# Patient Record
Sex: Female | Born: 1935 | Race: Black or African American | Hispanic: No | Smoking: Never smoker
Health system: Southern US, Community
[De-identification: ages and names within clinical notes are randomized; demographics above are authoritative.]

## PROBLEM LIST (undated history)

## (undated) DIAGNOSIS — Z9889 Other specified postprocedural states: Secondary | ICD-10-CM

## (undated) DIAGNOSIS — M1990 Unspecified osteoarthritis, unspecified site: Secondary | ICD-10-CM

## (undated) DIAGNOSIS — I639 Cerebral infarction, unspecified: Secondary | ICD-10-CM

## (undated) DIAGNOSIS — E785 Hyperlipidemia, unspecified: Secondary | ICD-10-CM

## (undated) DIAGNOSIS — T4145XA Adverse effect of unspecified anesthetic, initial encounter: Secondary | ICD-10-CM

## (undated) DIAGNOSIS — I509 Heart failure, unspecified: Secondary | ICD-10-CM

## (undated) DIAGNOSIS — Z8669 Personal history of other diseases of the nervous system and sense organs: Secondary | ICD-10-CM

## (undated) DIAGNOSIS — K219 Gastro-esophageal reflux disease without esophagitis: Secondary | ICD-10-CM

## (undated) DIAGNOSIS — T8859XA Other complications of anesthesia, initial encounter: Secondary | ICD-10-CM

## (undated) DIAGNOSIS — Z9289 Personal history of other medical treatment: Secondary | ICD-10-CM

## (undated) DIAGNOSIS — G4730 Sleep apnea, unspecified: Secondary | ICD-10-CM

## (undated) DIAGNOSIS — I2510 Atherosclerotic heart disease of native coronary artery without angina pectoris: Secondary | ICD-10-CM

## (undated) DIAGNOSIS — F32A Depression, unspecified: Secondary | ICD-10-CM

## (undated) DIAGNOSIS — R112 Nausea with vomiting, unspecified: Secondary | ICD-10-CM

## (undated) DIAGNOSIS — I219 Acute myocardial infarction, unspecified: Secondary | ICD-10-CM

## (undated) DIAGNOSIS — I10 Essential (primary) hypertension: Secondary | ICD-10-CM

## (undated) DIAGNOSIS — C801 Malignant (primary) neoplasm, unspecified: Secondary | ICD-10-CM

## (undated) DIAGNOSIS — L409 Psoriasis, unspecified: Secondary | ICD-10-CM

## (undated) DIAGNOSIS — Z789 Other specified health status: Secondary | ICD-10-CM

## (undated) DIAGNOSIS — I739 Peripheral vascular disease, unspecified: Secondary | ICD-10-CM

## (undated) DIAGNOSIS — R0602 Shortness of breath: Secondary | ICD-10-CM

## (undated) DIAGNOSIS — F329 Major depressive disorder, single episode, unspecified: Secondary | ICD-10-CM

## (undated) DIAGNOSIS — Z87440 Personal history of urinary (tract) infections: Secondary | ICD-10-CM

## (undated) DIAGNOSIS — F419 Anxiety disorder, unspecified: Secondary | ICD-10-CM

## (undated) DIAGNOSIS — R222 Localized swelling, mass and lump, trunk: Secondary | ICD-10-CM

## (undated) DIAGNOSIS — R51 Headache: Secondary | ICD-10-CM

## (undated) DIAGNOSIS — G459 Transient cerebral ischemic attack, unspecified: Secondary | ICD-10-CM

## (undated) HISTORY — DX: Gastro-esophageal reflux disease without esophagitis: K21.9

## (undated) HISTORY — DX: Unspecified osteoarthritis, unspecified site: M19.90

## (undated) HISTORY — PX: TONSILLECTOMY AND ADENOIDECTOMY: SUR1326

## (undated) HISTORY — DX: Heart failure, unspecified: I50.9

## (undated) HISTORY — PX: NO PAST SURGERIES: SHX2092

## (undated) HISTORY — DX: Other specified postprocedural states: Z98.890

## (undated) HISTORY — PX: ABDOMINAL HYSTERECTOMY: SHX81

## (undated) HISTORY — DX: Psoriasis, unspecified: L40.9

## (undated) HISTORY — PX: APPENDECTOMY: SHX54

## (undated) HISTORY — DX: Anxiety disorder, unspecified: F41.9

## (undated) HISTORY — DX: Depression, unspecified: F32.A

## (undated) HISTORY — DX: Major depressive disorder, single episode, unspecified: F32.9

## (undated) HISTORY — DX: Malignant (primary) neoplasm, unspecified: C80.1

## (undated) HISTORY — DX: Hyperlipidemia, unspecified: E78.5

## (undated) HISTORY — PX: EYE SURGERY: SHX253

## (undated) HISTORY — DX: Acute myocardial infarction, unspecified: I21.9

## (undated) HISTORY — DX: Personal history of other diseases of the nervous system and sense organs: Z86.69

## (undated) HISTORY — DX: Essential (primary) hypertension: I10

## (undated) HISTORY — PX: UMBILICAL HERNIA REPAIR: SHX196

## (undated) HISTORY — DX: Atherosclerotic heart disease of native coronary artery without angina pectoris: I25.10

---

## 1979-08-15 DIAGNOSIS — I219 Acute myocardial infarction, unspecified: Secondary | ICD-10-CM

## 1979-08-15 HISTORY — DX: Acute myocardial infarction, unspecified: I21.9

## 1993-12-14 HISTORY — PX: BREAST SURGERY: SHX581

## 1995-12-15 HISTORY — PX: FINGER AMPUTATION: SHX636

## 1998-03-14 ENCOUNTER — Encounter: Admission: RE | Admit: 1998-03-14 | Discharge: 1998-06-12 | Payer: Self-pay | Admitting: Dentistry

## 1999-02-20 ENCOUNTER — Encounter: Admission: RE | Admit: 1999-02-20 | Discharge: 1999-05-21 | Payer: Self-pay | Admitting: Anesthesiology

## 1999-03-25 ENCOUNTER — Encounter: Payer: Self-pay | Admitting: Vascular Surgery

## 1999-03-25 ENCOUNTER — Inpatient Hospital Stay: Admission: EM | Admit: 1999-03-25 | Discharge: 1999-04-04 | Payer: Self-pay | Admitting: Vascular Surgery

## 1999-03-27 ENCOUNTER — Encounter: Payer: Self-pay | Admitting: Vascular Surgery

## 2001-05-04 ENCOUNTER — Encounter: Payer: Self-pay | Admitting: Cardiology

## 2001-05-04 ENCOUNTER — Ambulatory Visit (HOSPITAL_COMMUNITY): Admission: RE | Admit: 2001-05-04 | Discharge: 2001-05-04 | Payer: Self-pay | Admitting: Cardiology

## 2001-10-13 ENCOUNTER — Ambulatory Visit (HOSPITAL_COMMUNITY): Admission: RE | Admit: 2001-10-13 | Discharge: 2001-10-13 | Payer: Self-pay | Admitting: Family Medicine

## 2001-10-13 ENCOUNTER — Encounter: Payer: Self-pay | Admitting: Family Medicine

## 2002-04-06 ENCOUNTER — Encounter: Payer: Self-pay | Admitting: Vascular Surgery

## 2002-04-10 ENCOUNTER — Ambulatory Visit (HOSPITAL_COMMUNITY): Admission: RE | Admit: 2002-04-10 | Discharge: 2002-04-10 | Payer: Self-pay | Admitting: Vascular Surgery

## 2002-04-14 ENCOUNTER — Inpatient Hospital Stay (HOSPITAL_COMMUNITY): Admission: RE | Admit: 2002-04-14 | Discharge: 2002-04-15 | Payer: Self-pay | Admitting: Vascular Surgery

## 2002-06-20 ENCOUNTER — Ambulatory Visit (HOSPITAL_COMMUNITY): Admission: RE | Admit: 2002-06-20 | Discharge: 2002-06-20 | Payer: Self-pay | Admitting: Oral Surgery

## 2002-10-16 ENCOUNTER — Encounter: Payer: Self-pay | Admitting: Family Medicine

## 2002-10-16 ENCOUNTER — Ambulatory Visit (HOSPITAL_COMMUNITY): Admission: RE | Admit: 2002-10-16 | Discharge: 2002-10-16 | Payer: Self-pay | Admitting: Family Medicine

## 2002-12-27 ENCOUNTER — Emergency Department (HOSPITAL_COMMUNITY): Admission: EM | Admit: 2002-12-27 | Discharge: 2002-12-27 | Payer: Self-pay | Admitting: *Deleted

## 2003-05-30 ENCOUNTER — Emergency Department (HOSPITAL_COMMUNITY): Admission: EM | Admit: 2003-05-30 | Discharge: 2003-05-31 | Payer: Self-pay | Admitting: *Deleted

## 2003-05-31 ENCOUNTER — Encounter: Payer: Self-pay | Admitting: *Deleted

## 2003-07-02 ENCOUNTER — Emergency Department (HOSPITAL_COMMUNITY): Admission: EM | Admit: 2003-07-02 | Discharge: 2003-07-02 | Payer: Self-pay | Admitting: *Deleted

## 2003-07-16 ENCOUNTER — Encounter: Payer: Self-pay | Admitting: Neurology

## 2003-07-16 ENCOUNTER — Ambulatory Visit (HOSPITAL_COMMUNITY): Admission: RE | Admit: 2003-07-16 | Discharge: 2003-07-16 | Payer: Self-pay | Admitting: Neurology

## 2003-08-24 ENCOUNTER — Encounter: Payer: Self-pay | Admitting: Family Medicine

## 2003-08-24 ENCOUNTER — Ambulatory Visit (HOSPITAL_COMMUNITY): Admission: RE | Admit: 2003-08-24 | Discharge: 2003-08-24 | Payer: Self-pay | Admitting: Family Medicine

## 2003-10-19 ENCOUNTER — Ambulatory Visit (HOSPITAL_COMMUNITY): Admission: RE | Admit: 2003-10-19 | Discharge: 2003-10-19 | Payer: Self-pay | Admitting: Family Medicine

## 2003-12-12 ENCOUNTER — Ambulatory Visit (HOSPITAL_COMMUNITY): Admission: RE | Admit: 2003-12-12 | Discharge: 2003-12-12 | Payer: Self-pay | Admitting: Family Medicine

## 2004-04-27 ENCOUNTER — Inpatient Hospital Stay (HOSPITAL_COMMUNITY): Admission: EM | Admit: 2004-04-27 | Discharge: 2004-04-30 | Payer: Self-pay | Admitting: Emergency Medicine

## 2004-05-14 ENCOUNTER — Ambulatory Visit (HOSPITAL_COMMUNITY): Admission: RE | Admit: 2004-05-14 | Discharge: 2004-05-14 | Payer: Self-pay | Admitting: Family Medicine

## 2004-09-01 ENCOUNTER — Emergency Department (HOSPITAL_COMMUNITY): Admission: EM | Admit: 2004-09-01 | Discharge: 2004-09-01 | Payer: Self-pay | Admitting: Emergency Medicine

## 2004-09-29 ENCOUNTER — Ambulatory Visit (HOSPITAL_COMMUNITY): Admission: RE | Admit: 2004-09-29 | Discharge: 2004-09-29 | Payer: Self-pay | Admitting: Orthopedic Surgery

## 2004-10-24 ENCOUNTER — Encounter: Admission: RE | Admit: 2004-10-24 | Discharge: 2004-10-24 | Payer: Self-pay | Admitting: Orthopedic Surgery

## 2004-10-27 ENCOUNTER — Ambulatory Visit (HOSPITAL_COMMUNITY): Admission: RE | Admit: 2004-10-27 | Discharge: 2004-10-27 | Payer: Self-pay | Admitting: Family Medicine

## 2004-12-14 HISTORY — PX: OTHER SURGICAL HISTORY: SHX169

## 2004-12-15 ENCOUNTER — Ambulatory Visit (HOSPITAL_COMMUNITY): Admission: RE | Admit: 2004-12-15 | Discharge: 2004-12-15 | Payer: Self-pay | Admitting: Family Medicine

## 2005-03-20 ENCOUNTER — Ambulatory Visit (HOSPITAL_COMMUNITY): Admission: RE | Admit: 2005-03-20 | Discharge: 2005-03-20 | Payer: Self-pay | Admitting: Otolaryngology

## 2005-04-30 ENCOUNTER — Ambulatory Visit: Payer: Self-pay | Admitting: Cardiology

## 2005-05-12 ENCOUNTER — Ambulatory Visit: Payer: Self-pay

## 2005-06-12 ENCOUNTER — Ambulatory Visit: Payer: Self-pay | Admitting: Cardiology

## 2005-06-12 ENCOUNTER — Inpatient Hospital Stay (HOSPITAL_COMMUNITY): Admission: AD | Admit: 2005-06-12 | Discharge: 2005-06-17 | Payer: Self-pay | Admitting: Family Medicine

## 2005-07-27 ENCOUNTER — Ambulatory Visit (HOSPITAL_COMMUNITY): Admission: RE | Admit: 2005-07-27 | Discharge: 2005-07-27 | Payer: Self-pay | Admitting: Internal Medicine

## 2005-08-20 ENCOUNTER — Ambulatory Visit (HOSPITAL_COMMUNITY): Admission: RE | Admit: 2005-08-20 | Discharge: 2005-08-20 | Payer: Self-pay | Admitting: Urology

## 2005-09-03 ENCOUNTER — Emergency Department (HOSPITAL_COMMUNITY): Admission: EM | Admit: 2005-09-03 | Discharge: 2005-09-04 | Payer: Self-pay | Admitting: Emergency Medicine

## 2005-10-29 ENCOUNTER — Ambulatory Visit (HOSPITAL_COMMUNITY): Admission: RE | Admit: 2005-10-29 | Discharge: 2005-10-29 | Payer: Self-pay | Admitting: Family Medicine

## 2005-11-04 ENCOUNTER — Other Ambulatory Visit: Admission: RE | Admit: 2005-11-04 | Discharge: 2005-11-04 | Payer: Self-pay | Admitting: Dermatology

## 2006-01-09 ENCOUNTER — Emergency Department (HOSPITAL_COMMUNITY): Admission: EM | Admit: 2006-01-09 | Discharge: 2006-01-09 | Payer: Self-pay | Admitting: Emergency Medicine

## 2006-02-26 ENCOUNTER — Ambulatory Visit (HOSPITAL_COMMUNITY): Admission: RE | Admit: 2006-02-26 | Discharge: 2006-02-26 | Payer: Self-pay | Admitting: Family Medicine

## 2006-03-17 ENCOUNTER — Ambulatory Visit: Payer: Self-pay | Admitting: Cardiology

## 2006-03-17 ENCOUNTER — Ambulatory Visit: Payer: Self-pay

## 2006-03-29 ENCOUNTER — Ambulatory Visit: Payer: Self-pay | Admitting: Cardiology

## 2006-03-29 ENCOUNTER — Ambulatory Visit: Payer: Self-pay

## 2006-04-09 ENCOUNTER — Ambulatory Visit: Payer: Self-pay | Admitting: Cardiology

## 2006-04-19 ENCOUNTER — Ambulatory Visit (HOSPITAL_COMMUNITY): Admission: RE | Admit: 2006-04-19 | Discharge: 2006-04-19 | Payer: Self-pay | Admitting: Family Medicine

## 2006-04-23 ENCOUNTER — Ambulatory Visit: Payer: Self-pay | Admitting: Cardiology

## 2006-05-05 ENCOUNTER — Ambulatory Visit: Payer: Self-pay | Admitting: Cardiology

## 2006-05-05 ENCOUNTER — Ambulatory Visit: Payer: Self-pay

## 2006-11-01 ENCOUNTER — Ambulatory Visit (HOSPITAL_COMMUNITY): Admission: RE | Admit: 2006-11-01 | Discharge: 2006-11-01 | Payer: Self-pay | Admitting: Family Medicine

## 2006-11-08 ENCOUNTER — Ambulatory Visit: Payer: Self-pay | Admitting: Cardiology

## 2006-12-26 ENCOUNTER — Observation Stay (HOSPITAL_COMMUNITY): Admission: EM | Admit: 2006-12-26 | Discharge: 2006-12-28 | Payer: Self-pay | Admitting: Emergency Medicine

## 2007-01-05 ENCOUNTER — Ambulatory Visit: Payer: Self-pay | Admitting: Gastroenterology

## 2007-01-05 ENCOUNTER — Encounter (INDEPENDENT_AMBULATORY_CARE_PROVIDER_SITE_OTHER): Payer: Self-pay | Admitting: *Deleted

## 2007-01-05 ENCOUNTER — Ambulatory Visit (HOSPITAL_COMMUNITY): Admission: RE | Admit: 2007-01-05 | Discharge: 2007-01-05 | Payer: Self-pay | Admitting: Gastroenterology

## 2007-01-20 ENCOUNTER — Ambulatory Visit: Payer: Self-pay | Admitting: Gastroenterology

## 2007-02-18 ENCOUNTER — Ambulatory Visit (HOSPITAL_COMMUNITY): Admission: RE | Admit: 2007-02-18 | Discharge: 2007-02-18 | Payer: Self-pay | Admitting: Gastroenterology

## 2007-03-08 ENCOUNTER — Ambulatory Visit: Payer: Self-pay | Admitting: Gastroenterology

## 2007-04-06 ENCOUNTER — Encounter (INDEPENDENT_AMBULATORY_CARE_PROVIDER_SITE_OTHER): Payer: Self-pay | Admitting: *Deleted

## 2007-04-06 ENCOUNTER — Ambulatory Visit (HOSPITAL_COMMUNITY): Admission: RE | Admit: 2007-04-06 | Discharge: 2007-04-06 | Payer: Self-pay | Admitting: Gastroenterology

## 2007-04-06 ENCOUNTER — Ambulatory Visit: Payer: Self-pay | Admitting: Gastroenterology

## 2007-04-12 ENCOUNTER — Observation Stay (HOSPITAL_COMMUNITY): Admission: EM | Admit: 2007-04-12 | Discharge: 2007-04-15 | Payer: Self-pay | Admitting: Emergency Medicine

## 2007-04-20 ENCOUNTER — Ambulatory Visit: Payer: Self-pay | Admitting: Cardiology

## 2007-05-02 ENCOUNTER — Ambulatory Visit: Payer: Self-pay | Admitting: Gastroenterology

## 2007-09-17 ENCOUNTER — Emergency Department (HOSPITAL_COMMUNITY): Admission: EM | Admit: 2007-09-17 | Discharge: 2007-09-18 | Payer: Self-pay | Admitting: Emergency Medicine

## 2007-10-24 ENCOUNTER — Ambulatory Visit: Payer: Self-pay | Admitting: Cardiology

## 2007-11-03 ENCOUNTER — Ambulatory Visit (HOSPITAL_COMMUNITY): Admission: RE | Admit: 2007-11-03 | Discharge: 2007-11-03 | Payer: Self-pay | Admitting: Family Medicine

## 2007-11-22 ENCOUNTER — Ambulatory Visit: Payer: Self-pay | Admitting: Vascular Surgery

## 2007-11-24 ENCOUNTER — Ambulatory Visit: Payer: Self-pay | Admitting: Gastroenterology

## 2007-12-10 ENCOUNTER — Observation Stay (HOSPITAL_COMMUNITY): Admission: EM | Admit: 2007-12-10 | Discharge: 2007-12-12 | Payer: Self-pay | Admitting: Emergency Medicine

## 2007-12-12 ENCOUNTER — Encounter (INDEPENDENT_AMBULATORY_CARE_PROVIDER_SITE_OTHER): Payer: Self-pay | Admitting: Family Medicine

## 2008-02-20 ENCOUNTER — Ambulatory Visit (HOSPITAL_COMMUNITY): Admission: RE | Admit: 2008-02-20 | Discharge: 2008-02-20 | Payer: Self-pay | Admitting: Gastroenterology

## 2008-03-22 ENCOUNTER — Emergency Department (HOSPITAL_COMMUNITY): Admission: EM | Admit: 2008-03-22 | Discharge: 2008-03-22 | Payer: Self-pay | Admitting: Emergency Medicine

## 2008-04-23 ENCOUNTER — Ambulatory Visit (HOSPITAL_COMMUNITY): Admission: RE | Admit: 2008-04-23 | Discharge: 2008-04-23 | Payer: Self-pay | Admitting: Family Medicine

## 2008-05-08 ENCOUNTER — Ambulatory Visit: Payer: Self-pay | Admitting: Cardiology

## 2008-05-22 ENCOUNTER — Ambulatory Visit: Payer: Self-pay | Admitting: Vascular Surgery

## 2008-06-05 ENCOUNTER — Ambulatory Visit: Payer: Self-pay | Admitting: Gastroenterology

## 2008-10-22 ENCOUNTER — Ambulatory Visit: Payer: Self-pay | Admitting: Cardiology

## 2008-10-22 LAB — CONVERTED CEMR LAB
BUN: 13 mg/dL (ref 6–23)
CO2: 29 meq/L (ref 19–32)
Calcium: 9.1 mg/dL (ref 8.4–10.5)
Creatinine, Ser: 1.1 mg/dL (ref 0.4–1.2)

## 2008-10-26 DIAGNOSIS — I10 Essential (primary) hypertension: Secondary | ICD-10-CM | POA: Insufficient documentation

## 2008-10-26 DIAGNOSIS — K8020 Calculus of gallbladder without cholecystitis without obstruction: Secondary | ICD-10-CM | POA: Insufficient documentation

## 2008-10-26 DIAGNOSIS — Z853 Personal history of malignant neoplasm of breast: Secondary | ICD-10-CM

## 2008-10-26 DIAGNOSIS — H409 Unspecified glaucoma: Secondary | ICD-10-CM | POA: Insufficient documentation

## 2008-10-26 DIAGNOSIS — S88911A Complete traumatic amputation of right lower leg, level unspecified, initial encounter: Secondary | ICD-10-CM

## 2008-10-26 DIAGNOSIS — E118 Type 2 diabetes mellitus with unspecified complications: Secondary | ICD-10-CM

## 2008-10-26 DIAGNOSIS — S88912A Complete traumatic amputation of left lower leg, level unspecified, initial encounter: Secondary | ICD-10-CM

## 2008-10-26 DIAGNOSIS — E1165 Type 2 diabetes mellitus with hyperglycemia: Secondary | ICD-10-CM

## 2008-10-26 DIAGNOSIS — Z87448 Personal history of other diseases of urinary system: Secondary | ICD-10-CM | POA: Insufficient documentation

## 2008-10-26 DIAGNOSIS — A048 Other specified bacterial intestinal infections: Secondary | ICD-10-CM | POA: Insufficient documentation

## 2008-11-06 ENCOUNTER — Ambulatory Visit (HOSPITAL_COMMUNITY): Admission: RE | Admit: 2008-11-06 | Discharge: 2008-11-06 | Payer: Self-pay | Admitting: Family Medicine

## 2008-11-22 ENCOUNTER — Ambulatory Visit (HOSPITAL_COMMUNITY): Admission: RE | Admit: 2008-11-22 | Discharge: 2008-11-22 | Payer: Self-pay | Admitting: Family Medicine

## 2008-11-27 ENCOUNTER — Ambulatory Visit: Payer: Self-pay | Admitting: Vascular Surgery

## 2008-12-14 DIAGNOSIS — R222 Localized swelling, mass and lump, trunk: Secondary | ICD-10-CM

## 2008-12-14 HISTORY — DX: Localized swelling, mass and lump, trunk: R22.2

## 2008-12-16 ENCOUNTER — Ambulatory Visit: Payer: Self-pay | Admitting: Physical Medicine & Rehabilitation

## 2009-02-05 ENCOUNTER — Ambulatory Visit (HOSPITAL_COMMUNITY): Admission: RE | Admit: 2009-02-05 | Discharge: 2009-02-05 | Payer: Self-pay | Admitting: Urology

## 2009-02-12 ENCOUNTER — Ambulatory Visit (HOSPITAL_COMMUNITY): Admission: RE | Admit: 2009-02-12 | Discharge: 2009-02-12 | Payer: Self-pay | Admitting: Gastroenterology

## 2009-02-25 ENCOUNTER — Ambulatory Visit (HOSPITAL_COMMUNITY): Admission: RE | Admit: 2009-02-25 | Discharge: 2009-02-25 | Payer: Self-pay | Admitting: Urology

## 2009-03-12 ENCOUNTER — Ambulatory Visit: Payer: Self-pay | Admitting: Vascular Surgery

## 2009-06-01 DIAGNOSIS — R609 Edema, unspecified: Secondary | ICD-10-CM

## 2009-06-11 ENCOUNTER — Ambulatory Visit: Payer: Self-pay | Admitting: Cardiology

## 2009-07-09 ENCOUNTER — Ambulatory Visit: Payer: Self-pay | Admitting: Vascular Surgery

## 2009-08-24 ENCOUNTER — Emergency Department (HOSPITAL_COMMUNITY): Admission: EM | Admit: 2009-08-24 | Discharge: 2009-08-24 | Payer: Self-pay | Admitting: Emergency Medicine

## 2009-09-19 ENCOUNTER — Ambulatory Visit: Payer: Self-pay | Admitting: Gastroenterology

## 2009-09-19 DIAGNOSIS — K589 Irritable bowel syndrome without diarrhea: Secondary | ICD-10-CM | POA: Insufficient documentation

## 2009-09-22 ENCOUNTER — Emergency Department (HOSPITAL_COMMUNITY): Admission: EM | Admit: 2009-09-22 | Discharge: 2009-09-22 | Payer: Self-pay | Admitting: Emergency Medicine

## 2009-09-23 DIAGNOSIS — R1084 Generalized abdominal pain: Secondary | ICD-10-CM | POA: Insufficient documentation

## 2009-09-23 DIAGNOSIS — K5900 Constipation, unspecified: Secondary | ICD-10-CM | POA: Insufficient documentation

## 2009-09-30 ENCOUNTER — Telehealth: Payer: Self-pay | Admitting: Cardiology

## 2009-10-03 ENCOUNTER — Ambulatory Visit: Payer: Self-pay | Admitting: Vascular Surgery

## 2009-10-18 ENCOUNTER — Encounter (INDEPENDENT_AMBULATORY_CARE_PROVIDER_SITE_OTHER): Payer: Self-pay | Admitting: *Deleted

## 2009-10-24 ENCOUNTER — Encounter: Admission: RE | Admit: 2009-10-24 | Discharge: 2009-10-24 | Payer: Self-pay | Admitting: Vascular Surgery

## 2009-10-24 ENCOUNTER — Ambulatory Visit: Payer: Self-pay | Admitting: Vascular Surgery

## 2009-11-08 ENCOUNTER — Ambulatory Visit (HOSPITAL_COMMUNITY): Admission: RE | Admit: 2009-11-08 | Discharge: 2009-11-08 | Payer: Self-pay | Admitting: Family Medicine

## 2009-11-11 ENCOUNTER — Ambulatory Visit (HOSPITAL_COMMUNITY): Admission: RE | Admit: 2009-11-11 | Discharge: 2009-11-11 | Payer: Self-pay | Admitting: Vascular Surgery

## 2009-11-11 ENCOUNTER — Ambulatory Visit: Payer: Self-pay | Admitting: Vascular Surgery

## 2009-11-27 ENCOUNTER — Ambulatory Visit: Payer: Self-pay | Admitting: Vascular Surgery

## 2009-12-05 ENCOUNTER — Ambulatory Visit: Payer: Self-pay | Admitting: Vascular Surgery

## 2009-12-11 ENCOUNTER — Ambulatory Visit: Payer: Self-pay | Admitting: Cardiovascular Disease

## 2009-12-11 ENCOUNTER — Ambulatory Visit: Payer: Self-pay | Admitting: Vascular Surgery

## 2009-12-11 ENCOUNTER — Inpatient Hospital Stay (HOSPITAL_COMMUNITY): Admission: AD | Admit: 2009-12-11 | Discharge: 2009-12-25 | Payer: Self-pay | Admitting: Internal Medicine

## 2009-12-11 ENCOUNTER — Encounter (INDEPENDENT_AMBULATORY_CARE_PROVIDER_SITE_OTHER): Payer: Self-pay | Admitting: Internal Medicine

## 2009-12-11 ENCOUNTER — Encounter: Payer: Self-pay | Admitting: Vascular Surgery

## 2009-12-12 HISTORY — PX: ABOVE KNEE LEG AMPUTATION: SUR20

## 2009-12-13 ENCOUNTER — Ambulatory Visit: Payer: Self-pay | Admitting: Internal Medicine

## 2009-12-14 ENCOUNTER — Ambulatory Visit: Payer: Self-pay | Admitting: Emergency Medicine

## 2009-12-23 ENCOUNTER — Encounter (INDEPENDENT_AMBULATORY_CARE_PROVIDER_SITE_OTHER): Payer: Self-pay | Admitting: Internal Medicine

## 2010-01-16 ENCOUNTER — Ambulatory Visit: Payer: Self-pay | Admitting: Vascular Surgery

## 2010-02-03 ENCOUNTER — Encounter: Admission: RE | Admit: 2010-02-03 | Discharge: 2010-02-04 | Payer: Self-pay | Admitting: Vascular Surgery

## 2010-02-14 ENCOUNTER — Ambulatory Visit: Payer: Self-pay | Admitting: Vascular Surgery

## 2010-02-24 ENCOUNTER — Ambulatory Visit: Admission: RE | Admit: 2010-02-24 | Discharge: 2010-02-24 | Payer: Self-pay | Admitting: Vascular Surgery

## 2010-03-25 ENCOUNTER — Ambulatory Visit: Payer: Self-pay | Admitting: Vascular Surgery

## 2010-04-01 ENCOUNTER — Emergency Department (HOSPITAL_COMMUNITY): Admission: EM | Admit: 2010-04-01 | Discharge: 2010-04-01 | Payer: Self-pay | Admitting: Emergency Medicine

## 2010-04-24 ENCOUNTER — Inpatient Hospital Stay (HOSPITAL_COMMUNITY): Admission: EM | Admit: 2010-04-24 | Discharge: 2010-04-29 | Payer: Self-pay | Admitting: Emergency Medicine

## 2010-05-14 ENCOUNTER — Ambulatory Visit (HOSPITAL_COMMUNITY): Admission: RE | Admit: 2010-05-14 | Discharge: 2010-05-14 | Payer: Self-pay | Admitting: Family Medicine

## 2010-05-26 ENCOUNTER — Ambulatory Visit (HOSPITAL_COMMUNITY): Admission: RE | Admit: 2010-05-26 | Discharge: 2010-05-26 | Payer: Self-pay | Admitting: Family Medicine

## 2010-06-10 ENCOUNTER — Other Ambulatory Visit: Admission: RE | Admit: 2010-06-10 | Discharge: 2010-06-10 | Payer: Self-pay | Admitting: Interventional Radiology

## 2010-06-10 ENCOUNTER — Encounter: Admission: RE | Admit: 2010-06-10 | Discharge: 2010-06-10 | Payer: Self-pay | Admitting: Family Medicine

## 2010-07-03 ENCOUNTER — Ambulatory Visit: Payer: Self-pay | Admitting: Vascular Surgery

## 2010-12-24 ENCOUNTER — Ambulatory Visit
Admission: RE | Admit: 2010-12-24 | Discharge: 2010-12-24 | Payer: Self-pay | Source: Home / Self Care | Attending: Vascular Surgery | Admitting: Vascular Surgery

## 2010-12-24 ENCOUNTER — Ambulatory Visit: Admit: 2010-12-24 | Payer: Self-pay | Admitting: Vascular Surgery

## 2011-01-03 NOTE — Procedures (Unsigned)
BYPASS GRAFT EVALUATION  INDICATION:  Follow up right lower extremity bypass graft.  HISTORY: Diabetes:  Yes. Cardiac:  MI. Hypertension:  Yes. Smoking:  No. Previous Surgery:  Right femoral-peroneal artery bypass graft, 10/07/1995; left AKA, 12/12/2009.  SINGLE LEVEL ARTERIAL EXAM                              RIGHT              LEFT Brachial:                    132 Anterior tibial:             121 Posterior tibial:            135 Peroneal:                    133 Ankle/brachial index:        1.02  PREVIOUS ABI:  Date: 07/03/2010  RIGHT:  1.13  LEFT:  AKA  LOWER EXTREMITY BYPASS GRAFT DUPLEX EXAM:  DUPLEX: 1. Patent right femoral-peroneal artery bypass graft with stable     elevated velocities of 493 cm/s at the distal anastomosis. 2. Unable to visualize proximal thigh area due to positioning while in     wheelchair.  IMPRESSION: 1. Patent right femoral-peroneal artery bypass graft with stable     distal anastomosis stenosis. 2. Stable right ankle brachial index. 3. No significant changes from previous study.  ___________________________________________ Di Kindle. Edilia Bo, M.D.  AS/MEDQ  D:  12/24/2010  T:  12/24/2010  Job:  [id number]

## 2011-01-20 ENCOUNTER — Encounter: Payer: Self-pay | Admitting: Cardiology

## 2011-01-20 ENCOUNTER — Ambulatory Visit (INDEPENDENT_AMBULATORY_CARE_PROVIDER_SITE_OTHER): Payer: Medicare Other | Admitting: Cardiology

## 2011-01-20 DIAGNOSIS — I2510 Atherosclerotic heart disease of native coronary artery without angina pectoris: Secondary | ICD-10-CM

## 2011-01-20 DIAGNOSIS — I6529 Occlusion and stenosis of unspecified carotid artery: Secondary | ICD-10-CM

## 2011-01-29 NOTE — Assessment & Plan Note (Signed)
Summary: f1y   Visit Type:  46yr follow up Primary Provider:  Renard Matter, M.D.  CC:  mild chest discomfort at times. edema in foot. pt has no other complaints today.  History of Present Illness: Mrs Bal returns today for evaluation and management of her coronary artery disease. She has diffuse vascular disease and nearly did not survive A. above the knee amputation of her left lower extremity in January of 2011.  At that time she had acute renal failure, respiratory arrest, systemic infection, and probably a stroke.  She's had side effects from statins including Lipitor, Crestor and simvastatin. She does not like to take medication which has been a lifelong issue.  She currently having no angina ischemic symptoms. She is confined mostly to a wheelchair. Her edema has been under control.  She had followup with vascular surgery January 2012. She had good blood flow in the right lower extremity by Doppler. Followup Dopplers in one year and followup with Dr. Edilia Bo in 2 years.  She denies orthopnea PND. She's had no tachycardia palpitations.  EKG is stable today.  Current Medications (verified): 1)  Lasix 20 Mg Tabs (Furosemide) .... Take 1 Tablet By Mouth Once A Day 2)  Pilocarpine Hcl 2 % Soln (Pilocarpine Hcl) .... One Drop To Right Eye Four Times Daily 3)  Humalog 100 Unit/ml Soln (Insulin Lispro (Human)) .... Use As Directed 4)  Lantus 100 Unit/ml Soln (Insulin Glargine) .... Use As Directed 5)  Cosopt 2-0.5 % Soln (Dorzolamide-Timolol) .... Twice Daily 6)  Travatan 0.004 % Soln (Travoprost) .Marland Kitchen.. 1 Gtt Ou At Bedtime 7)  Aspirin 81 Mg Tbec (Aspirin) .... Take One Tablet Once A Day 8)  Systane 0.4-0.3 % Soln (Polyethyl Glycol-Propyl Glycol) .... Once Daily 9)  Nitrofurantoin Monohyd Macro 100 Mg Caps (Nitrofurantoin Monohyd Macro) .Marland Kitchen.. 1 Cap Two Times A Day 10)  Pantoprazole Sodium 40 Mg Tbec (Pantoprazole Sodium) .... Once Daily 11)  Amlodipine Besylate 10 Mg Tabs (Amlodipine Besylate)  .... Once Daily 12)  Allopurinol 100 Mg Tabs (Allopurinol) .... Once Daily For Gout  Allergies: 1)  ! Asa 2)  ! Sulfa 3)  ! Penicillin 4)  ! Macrodantin 5)  ! Codeine 6)  ! Crestor (Rosuvastatin Calcium) 7)  ! Lipitor (Atorvastatin Calcium)  Past History:  Past Medical History: Last updated: 09/19/2009 CAROTID ARTERY DISEASE/NONOBSTRUCTIVE (ICD-433.10) CEREBRAL ARTERY OCCLUSION/MIDDLE (ICD-434.90) CAD, UNSPECIFIED SITE/NONOBSTRUCTIVE (ICD-414.00) PVD (ICD-443.9) HYPERTENSION (ICD-401.9) NEOPLASM, MALIGNANT, BREAST, HX OF (ICD-V10.3) EDEMA (ICD-782.3) GALLSTONES (ICD-574.20) UTI'S, HX OF (ICD-V13.00) GLAUCOMA (ICD-365.9) HELICOBACTER PYLORI GASTRITIS (ICD-041.86) DM (ICD-250.00) PSORIASIS   Past Surgical History: Last updated: 28-Jun-2009 Right Fem-Pop By Pass Hysterectomy with umbilical hernia repair Left Mastectomy ( followed by chemo) Retinal detachment Left third digit osteomyelitis status post amputation Tonsilectomy/ Adenoidectomy Tubular Adenoma removed during tcs in Jan. 2008 Appendectomy  Family History: Last updated: 28-Jun-2009 Mother: deceased at 24 from emphysema Father: deceased at 2 from emphysema  and seizures Family History of Cancer:  Family History of Diabetes:  Family History of COPD: Siblings: Brother deceased at 71 ...massive MI as well asthma and bronchitis  Social History: Last updated: 2009/06/28 Tobacco Use - No.  Alcohol Use - no Regular Exercise - no Drug Use - no Retired  Married   Risk Factors: Exercise: no (Jun 28, 2009)  Risk Factors: Smoking Status: never (06-28-09)  Review of Systems       negative other than history of present illness  Vital Signs:  Patient profile:   77 year old female Height:  65 inches Weight:      215 pounds BMI:     35.91 Pulse rate:   81 / minute BP sitting:   142 / 70  (right arm)  Vitals Entered By: Celestia Khat, CMA (January 20, 2011 9:04 AM)  Physical Exam  General:   morbidly obese, confined to wheelchair Head:  normocephalic and atraumatic Eyes:  dysconjugate gaze, otherwise normal Neck:  Neck supple, no JVD. No masses, thyromegaly or abnormal cervical nodes. Lungs:  Clear bilaterally to auscultation and percussion. Heart:  PMI poorly appreciated, normal S1-S2, soft carotid bruits Msk:  decreased ROM.   Pulses:  barely palpable in the right lower extremity Extremities:  no significant edema, left above-the-knee amputation Neurologic:  right hemiplegia Skin:  Intact without lesions or rashes. Psych:  depressed affect.     Impression & Recommendations:  Problem # 1:  CAROTID ARTERY DISEASE/NONOBSTRUCTIVE (ICD-433.10) Assessment Unchanged continue medical therapy, discussed the fact that we she could take a statin. I spent over 20 minutes talking to her and her daughter about her condition and what happened to her in the hospital after her surgery. She had numerous questions. I attempted to answer them all. I would not clear her for any elective surgery in the future. I made it clear to the family and her period Her updated medication list for this problem includes:    Aspirin 81 Mg Tbec (Aspirin) .Marland Kitchen... Take one tablet once a day  Problem # 2:  CEREBRAL ARTERY OCCLUSION/MIDDLE (ICD-434.90) Assessment: Unchanged  Her updated medication list for this problem includes:    Aspirin 81 Mg Tbec (Aspirin) .Marland Kitchen... Take one tablet once a day  Problem # 3:  CAD, UNSPECIFIED SITE/NONOBSTRUCTIVE (ICD-414.00) Assessment: Unchanged  The following medications were removed from the medication list:    Enalapril Maleate 20 Mg Tabs (Enalapril maleate) .Marland Kitchen... Take one tablet by mouth twice a day Her updated medication list for this problem includes:    Aspirin 81 Mg Tbec (Aspirin) .Marland Kitchen... Take one tablet once a day    Amlodipine Besylate 10 Mg Tabs (Amlodipine besylate) ..... Once daily  Orders: EKG w/ Interpretation (93000)  Problem # 4:  PVD  (ICD-443.9) Assessment: Unchanged  Problem # 5:  EDEMA (ICD-782.3) Assessment: Improved  Problem # 6:  HYPERTENSION (ICD-401.9) Assessment: Improved  The following medications were removed from the medication list:    Enalapril Maleate 20 Mg Tabs (Enalapril maleate) .Marland Kitchen... Take one tablet by mouth twice a day Her updated medication list for this problem includes:    Lasix 20 Mg Tabs (Furosemide) .Marland Kitchen... Take 1 tablet by mouth once a day    Aspirin 81 Mg Tbec (Aspirin) .Marland Kitchen... Take one tablet once a day    Amlodipine Besylate 10 Mg Tabs (Amlodipine besylate) ..... Once daily  Patient Instructions: 1)  Your physician recommends that you schedule a follow-up appointment in: 1 year with Dr. Daleen Squibb 2)  Your physician recommends that you continue on your current medications as directed. Please refer to the Current Medication list given to you today.

## 2011-02-10 ENCOUNTER — Other Ambulatory Visit (HOSPITAL_COMMUNITY): Payer: Self-pay | Admitting: Family Medicine

## 2011-02-10 DIAGNOSIS — E232 Diabetes insipidus: Secondary | ICD-10-CM

## 2011-02-10 DIAGNOSIS — I739 Peripheral vascular disease, unspecified: Secondary | ICD-10-CM

## 2011-02-16 ENCOUNTER — Ambulatory Visit (HOSPITAL_COMMUNITY)
Admission: RE | Admit: 2011-02-16 | Discharge: 2011-02-16 | Disposition: A | Discharge status: Home / Self Care | Payer: Medicare Other | Source: Ambulatory Visit | Attending: Family Medicine | Admitting: Family Medicine

## 2011-02-16 DIAGNOSIS — E119 Type 2 diabetes mellitus without complications: Secondary | ICD-10-CM | POA: Insufficient documentation

## 2011-02-16 DIAGNOSIS — I739 Peripheral vascular disease, unspecified: Secondary | ICD-10-CM | POA: Insufficient documentation

## 2011-02-16 DIAGNOSIS — E232 Diabetes insipidus: Secondary | ICD-10-CM

## 2011-02-16 DIAGNOSIS — K5520 Angiodysplasia of colon without hemorrhage: Secondary | ICD-10-CM | POA: Insufficient documentation

## 2011-02-16 DIAGNOSIS — E8889 Other specified metabolic disorders: Secondary | ICD-10-CM | POA: Insufficient documentation

## 2011-02-22 ENCOUNTER — Emergency Department (HOSPITAL_COMMUNITY): Admission: EM | Admit: 2011-02-22 | Source: Home / Self Care

## 2011-02-22 ENCOUNTER — Emergency Department (HOSPITAL_COMMUNITY): Payer: Medicare Other

## 2011-02-22 ENCOUNTER — Inpatient Hospital Stay (HOSPITAL_COMMUNITY)
Admission: EM | Admit: 2011-02-22 | Discharge: 2011-02-26 | DRG: 195 | Disposition: A | Discharge status: Home / Self Care | Payer: Medicare Other | Attending: Family Medicine | Admitting: Family Medicine

## 2011-02-22 DIAGNOSIS — H409 Unspecified glaucoma: Secondary | ICD-10-CM | POA: Diagnosis present

## 2011-02-22 DIAGNOSIS — S78119A Complete traumatic amputation at level between unspecified hip and knee, initial encounter: Secondary | ICD-10-CM

## 2011-02-22 DIAGNOSIS — I252 Old myocardial infarction: Secondary | ICD-10-CM

## 2011-02-22 DIAGNOSIS — Z8673 Personal history of transient ischemic attack (TIA), and cerebral infarction without residual deficits: Secondary | ICD-10-CM

## 2011-02-22 DIAGNOSIS — E119 Type 2 diabetes mellitus without complications: Secondary | ICD-10-CM | POA: Diagnosis present

## 2011-02-22 DIAGNOSIS — I739 Peripheral vascular disease, unspecified: Secondary | ICD-10-CM | POA: Diagnosis present

## 2011-02-22 DIAGNOSIS — J180 Bronchopneumonia, unspecified organism: Principal | ICD-10-CM | POA: Diagnosis present

## 2011-02-22 DIAGNOSIS — E785 Hyperlipidemia, unspecified: Secondary | ICD-10-CM | POA: Diagnosis present

## 2011-02-22 DIAGNOSIS — I2510 Atherosclerotic heart disease of native coronary artery without angina pectoris: Secondary | ICD-10-CM | POA: Diagnosis present

## 2011-02-22 LAB — DIFFERENTIAL
Basophils Relative: 0 % (ref 0–1)
Lymphs Abs: 2.1 10*3/uL (ref 0.7–4.0)
Monocytes Relative: 9 % (ref 3–12)
Neutro Abs: 3.4 10*3/uL (ref 1.7–7.7)
Neutrophils Relative %: 53 % (ref 43–77)

## 2011-02-22 LAB — CBC
Hemoglobin: 12.7 g/dL (ref 12.0–15.0)
MCH: 30 pg (ref 26.0–34.0)
MCV: 86.8 fL (ref 78.0–100.0)
RBC: 4.24 MIL/uL (ref 3.87–5.11)

## 2011-02-22 LAB — BASIC METABOLIC PANEL
CO2: 30 mEq/L (ref 19–32)
Chloride: 98 mEq/L (ref 96–112)
Creatinine, Ser: 1.45 mg/dL — ABNORMAL HIGH (ref 0.4–1.2)
GFR calc Af Amer: 42 mL/min — ABNORMAL LOW (ref >60)

## 2011-02-22 LAB — GLUCOSE, CAPILLARY
Glucose-Capillary: 183 mg/dL — ABNORMAL HIGH (ref 70–99)
Glucose-Capillary: 162 mg/dL — ABNORMAL HIGH (ref 70–99)
Glucose-Capillary: 91 mg/dL (ref 70–99)

## 2011-02-23 LAB — GLUCOSE, CAPILLARY
Glucose-Capillary: 89 mg/dL (ref 70–99)
Glucose-Capillary: 172 mg/dL — ABNORMAL HIGH (ref 70–99)
Glucose-Capillary: 124 mg/dL — ABNORMAL HIGH (ref 70–99)

## 2011-02-24 LAB — GLUCOSE, CAPILLARY: Glucose-Capillary: 212 mg/dL — ABNORMAL HIGH (ref 70–99)

## 2011-02-25 ENCOUNTER — Inpatient Hospital Stay (HOSPITAL_COMMUNITY): Payer: Medicare Other

## 2011-02-25 LAB — BASIC METABOLIC PANEL
BUN: 14 mg/dL (ref 6–23)
Calcium: 8.9 mg/dL (ref 8.4–10.5)
GFR calc non Af Amer: 39 mL/min — ABNORMAL LOW (ref >60)
Glucose, Bld: 97 mg/dL (ref 70–99)

## 2011-02-25 LAB — DIFFERENTIAL
Eosinophils Relative: 7 % — ABNORMAL HIGH (ref 0–5)
Lymphocytes Relative: 43 % (ref 12–46)
Lymphs Abs: 2.4 10*3/uL (ref 0.7–4.0)
Monocytes Absolute: 0.5 10*3/uL (ref 0.1–1.0)
Monocytes Relative: 8 % (ref 3–12)

## 2011-02-25 LAB — CBC
HCT: 35.9 % — ABNORMAL LOW (ref 36.0–46.0)
MCHC: 35.1 g/dL (ref 30.0–36.0)
MCV: 84.3 fL (ref 78.0–100.0)
RDW: 12.7 % (ref 11.5–15.5)

## 2011-02-25 LAB — GLUCOSE, CAPILLARY: Glucose-Capillary: 134 mg/dL — ABNORMAL HIGH (ref 70–99)

## 2011-02-26 LAB — GLUCOSE, CAPILLARY
Glucose-Capillary: 136 mg/dL — ABNORMAL HIGH (ref 70–99)
Glucose-Capillary: 140 mg/dL — ABNORMAL HIGH (ref 70–99)
Glucose-Capillary: 74 mg/dL (ref 70–99)

## 2011-02-26 NOTE — H&P (Signed)
  NAME:  [name], [name]                   ACCOUNT NO.:  [id number]  MEDICAL RECORD NO.:  [id number]           PATIENT TYPE:  LOCATION:                                 FACILITY:  PHYSICIAN:  Adina Puzzo G. Renard Matter, MD   DATE OF BIRTH:  Sep 08, 1926  DATE OF ADMISSION: DATE OF DISCHARGE:  LH                             HISTORY & PHYSICAL   HISTORY OF PRESENT ILLNESS:  [name], was admitted with chief complaint of being cough, some shortness of breath over the past few days.  She also has been having chills and fever, had been started on Levaquin as an outpatient.  She was seen in the emergency room by ED physician.  The patient did have a chest x-ray, which showed evidence of pneumonia.  It was felt that she should be admitted for further care.  SOCIAL HISTORY:  The patient does not smoke or drink alcohol or use drugs.  Lives at home with her relatives.  SURGICAL HISTORY:  The patient has had a previous history of above-knee amputation of left leg, CABG, coronary artery disease, previous hysterectomy, mastectomy, and eye surgery.  PAST MEDICAL HISTORY:  The patient has history of hypertension, diabetes, breast cancer, degenerative disk disease, glaucoma, hyperlipidemia, previous myocardial infarction, neuropathy, peripheral vascular disease, retinal detachment, transient ischemic attack.  ALLERGIES:  PENICILLIN, SULFA, SOME PAIN MEDICATIONS.  MEDICATION LIST: 1. Amlodipine 10 mg daily. 2. Levofloxacin 500 mg daily. 3. Benzonatate 100 mg. 4. Furosemide. 5. Humalog insulin. 6. Lantus insulin.  REVIEW OF SYSTEMS:  HEENT:  Negative.  CARDIOPULMONARY:  The patient has had some cough and dyspnea.  GI:  No bowel irregularity.  GU:  No dysuria or hematuria.  PHYSICAL EXAMINATION:  GENERAL:  Alert female. VITAL SIGNS:  Blood pressure 110/47, respirations 21, pulse 78, temperature 99.1. HEENT:  The patient has diminished vision.  PERRLA.  TM negative. Oropharynx  benign. NECK:  Supple.  No JVD or thyroid abnormalities. HEART:  Regular rhythm. LUNGS:  Rhonchi bilaterally.  Diminished breath sounds. ABDOMEN:  No palpable organs or masses.  No abdominal distention. SKIN:  Warm and dry. EXTREMITIES:  The patient does have some erythema of right leg and AK amputation of left leg.  ASSESSMENT:  The patient was admitted with bronchopneumonia.  She does have other medical problems, insulin-dependent diabetes, glaucoma, hyperlipidemia, hypertension, coronary artery disease, with previous myocardial infarction, peripheral vascular disease, history of transient ischemic attack.  PLAN:  To admit to start IV antibiotics, continue current medications otherwise.     [name] G. Renard Matter, MD     AGM/MEDQ  D:  02/22/2011  T:  02/23/2011  Job:  [id number]  Electronically Signed by Butch Penny MD on 02/26/2011 04:42:06 PM

## 2011-02-26 NOTE — Progress Notes (Signed)
  NAME:  [name], [name]                   ACCOUNT NO.:  [id number]  MEDICAL RECORD NO.:  [id number]           PATIENT TYPE:  I  LOCATION:  A308                          FACILITY:  [facility]  PHYSICIAN:  Chicquita Mendel G. Renard Matter, MD   DATE OF BIRTH:  Jul 19, 1926  DATE OF PROCEDURE: DATE OF DISCHARGE:                                PROGRESS NOTE   This patient continues to have slight cough, feels fairly comfortable this morning.  She did have abnormal x-ray of chest compatible with upper lobe airspace disease concerning for pneumonia and cardiomegaly.  OBJECTIVE:  VITAL SIGNS:  Blood pressure 120/70, respirations 18, pulse 67, temperature 97.4. LUNGS:  Diminished breath sounds. HEART:  Regular rhythm. ABDOMEN:  No palpable organs or masses. EXTREMITIES:  Right foot shows slight erythema.  ASSESSMENT:  The patient is being treated for what was felt to be pneumonia.  She does have insulin-dependent diabetes, glaucoma, hyperlipidemia, and hypertension.  Plan to obtain chest x-ray today. Continue IV antibiotics.     Rosalie Buenaventura G. Renard Matter, MD     AGM/MEDQ  D:  02/25/2011  T:  02/25/2011  Job:  [id number]  Electronically Signed by Butch Penny MD on 02/26/2011 04:42:33 PM

## 2011-02-26 NOTE — Progress Notes (Signed)
  NAME:  [name], [name]                   ACCOUNT NO.:  [id number]  MEDICAL RECORD NO.:  [id number]           PATIENT TYPE:  I  LOCATION:  A308                          FACILITY:  [facility]  PHYSICIAN:  Breanna Mcdaniel G. Renard Matter, MD   DATE OF BIRTH:  [birth year]  DATE OF PROCEDURE: DATE OF DISCHARGE:                                PROGRESS NOTE   This patient was admitted with shortness of breath, chills and fever. It was noted, she does not have evidence of pneumonia or CHF.  OBJECTIVE:  VITAL SIGNS:  Blood pressure 129/66, respirations 18, pulse 73, temp 98.7. LUNGS:  Diminished breath sounds bilaterally. HEART:  Regular rhythm. ABDOMEN:  No palpable organs or masses. EXTREMITIES:  The patient does have AK amputation on the left leg.  ASSESSMENT:  The patient was admitted with what was felt to be bronchopneumonia.  Does have other medical problems; insulin-dependent diabetes, glaucoma, hyperlipidemia, hypertension, coronary artery disease, previous myocardial infarction, and history of transient ischemic attack.  PLAN:  To continue current regimen.     Adrain Butrick G. Renard Matter, MD     AGM/MEDQ  D:  02/23/2011  T:  02/23/2011  Job:  [id number]  Electronically Signed by Butch Penny MD on 02/26/2011 04:42:30 PM

## 2011-02-26 NOTE — Progress Notes (Signed)
  NAME:  [name], [name]                   ACCOUNT NO.:  [id number]  MEDICAL RECORD NO.:  [id number]           PATIENT TYPE:  LOCATION:                                 FACILITY:  PHYSICIAN:  Fermon Ureta G. Renard Matter, MD   DATE OF BIRTH:  [birth year]  DATE OF PROCEDURE: DATE OF DISCHARGE:                                PROGRESS NOTE   This patient feels fairly comfortable this morning with minimal cough. She did have an abnormal x-ray which is compatible with upper lobe airspace disease, concerning for pneumonia.  Edema could have similar pattern, had mild cardiomegaly.  BNP was 86.8.  OBJECTIVE:  VITAL SIGNS:  Blood pressure 147/73, respiration 20, pulse 78, temperature 98.2. LUNGS:  Diminished breath sounds. HEART:  Regular rhythm. ABDOMEN:  No palpable organs or masses.  ASSESSMENT:  The patient was admitted with what is felt to be bronchopneumonia.  She does have insulin-dependent diabetes, glaucoma, hyperlipidemia, hypertension.  Plan to continue current regimen.  We will repeat chest x-ray in a.m. and continue current IV medications.     Saleem Coccia G. Renard Matter, MD     AGM/MEDQ  D:  02/24/2011  T:  02/24/2011  Job:  [id number]  Electronically Signed by Butch Penny MD on 02/26/2011 04:42:36 PM

## 2011-02-26 NOTE — Discharge Summary (Signed)
NAME:  [name], [name]                   ACCOUNT NO.:  [id number]  MEDICAL RECORD NO.:  [id number]           PATIENT TYPE:  I  LOCATION:  A308                          FACILITY:  [facility]  PHYSICIAN:  Lakeidra Reliford G. Renard Matter, MD   DATE OF BIRTH:  [birth year]  DATE OF ADMISSION:  02/22/2011 DATE OF DISCHARGE:  03/15/2012LH                              DISCHARGE SUMMARY   DIAGNOSES: 1. Bronchopneumonia. 2. Insulin-dependent diabetes. 3. Glaucoma. 4. Hyperlipidemia. 5. Hypertension. 6. Coronary artery disease and previous myocardial infarction. 7. Peripheral vascular disease. 8. Previous above-knee amputation, left leg. 9. History of transient ischemic attack.  The patient's condition was stable and improved at the time of her discharge.  This 77 year old female was admitted with chief complaint being cough, shortness of breath, over the past few days.  She had been having chills and fever as well, had been started on Levaquin as an outpatient, was seen in emergency room by ED physician.  Chest x-ray did show evidence of pneumonia.  She was admitted for further care.  LABORATORY DATA:  Admission CBC:  WBC 6,300 with hemoglobin 12.7, hematocrit 36.8.  BMP on admission:  Sodium 136, potassium 3.8, chloride 98, CO2 of 30, glucose 176, BUN 16, creatinine 1.45.  BNP was 133. Subsequent CBC on February 25, 2011:  WBC 5600 with hemoglobin 12.6, hematocrit 35.9.  BMP on this date:  Sodium 141, potassium 3.6, chloride 104, CO2 of 29, glucose 97, BUN 14, creatinine 1.29.  X-RAYS:  Chest x-ray on admission showed upper lobe airspace disease concerning for pneumonia, edema could have a similar pattern.  Chest x- ray done on February 25, 2011:  Cardiomegaly and pulmonary vascular congestion with minimal right-sided pleural effusion.  No segmental consolidation.  Previously demonstrated right paratracheal mass was again noted.  HOSPITAL COURSE:  The patient at the time of admission was placed on 2000 calories  ADA diet.  Vital signs were monitored q.i.d.  A Foley catheter was inserted.  She was given DVT prophylaxis with Lovenox 40 mg daily.  She was started on IV Rocephin 1 g daily and Zithromax 500 mg daily.  Continued on amlodipine 10 mg daily, Lasix 20 mg daily.  Accu- Cheks were monitored before meals and at bedtime.  She was given daily Lantus insulin 55 units, was given Tessalon Perles 100 mg every 4 hours p.r.n., and was continued on allopurinol 100 mg daily.  The patient slowly and progressively improved during the hospital stay.  It was felt that she could be discharged on the fourth hospital day.  She had been given Lasix 20 mg b.i.d. as well for edema.  The patient was discharged on the following medications: 1. Amlodipine 10 mg daily. 2. Aspirin 81 mg daily. 3. Colace 100 mg daily. 4. Cosopt 1 drop both eyes b.i.d. 5. Humalog insulin sliding scale. 6. Lantus insulin 55 units daily. 7. Lasix 20 mg daily. 8. Protonix 40 mg daily. 9. Pilocarpine 1 drop in right eye 4 times a day. 10.Systane lubricant eye drops. 11.Travatan 1 drop right eye at bedtime. 12.The patient was instructed to continue Levaquin 500 mg daily.  Celisse Ciulla G. Renard Matter, MD     AGM/MEDQ  D:  02/26/2011  T:  02/26/2011  Job:  [id number]  Electronically Signed by Butch Penny MD on 02/26/2011 04:42:26 PM

## 2011-03-01 LAB — CBC
HCT: 20.5 % — ABNORMAL LOW (ref 36.0–46.0)
HCT: 24.7 % — ABNORMAL LOW (ref 36.0–46.0)
HCT: 26.2 % — ABNORMAL LOW (ref 36.0–46.0)
Hemoglobin: 6.9 g/dL — CL (ref 12.0–15.0)
Hemoglobin: 7.2 g/dL — ABNORMAL LOW (ref 12.0–15.0)
Hemoglobin: 8.7 g/dL — ABNORMAL LOW (ref 12.0–15.0)
Hemoglobin: 8.9 g/dL — ABNORMAL LOW (ref 12.0–15.0)
MCHC: 33.8 g/dL (ref 30.0–36.0)
MCHC: 33.8 g/dL (ref 30.0–36.0)
MCHC: 34 g/dL (ref 30.0–36.0)
MCV: 90 fL (ref 78.0–100.0)
MCV: 90.3 fL (ref 78.0–100.0)
MCV: 90.4 fL (ref 78.0–100.0)
Platelets: 187 10*3/uL (ref 150–400)
Platelets: 278 10*3/uL (ref 150–400)
RBC: 2.26 MIL/uL — ABNORMAL LOW (ref 3.87–5.11)
RBC: 2.78 MIL/uL — ABNORMAL LOW (ref 3.87–5.11)
RBC: 2.86 MIL/uL — ABNORMAL LOW (ref 3.87–5.11)
RDW: 13.4 % (ref 11.5–15.5)
RDW: 13.7 % (ref 11.5–15.5)
WBC: 10.4 10*3/uL (ref 4.0–10.5)
WBC: 10.8 10*3/uL — ABNORMAL HIGH (ref 4.0–10.5)
WBC: 12.9 10*3/uL — ABNORMAL HIGH (ref 4.0–10.5)
WBC: 9.9 10*3/uL (ref 4.0–10.5)

## 2011-03-01 LAB — BASIC METABOLIC PANEL
BUN: 40 mg/dL — ABNORMAL HIGH (ref 6–23)
CO2: 22 mEq/L (ref 19–32)
CO2: 23 mEq/L (ref 19–32)
CO2: 23 mEq/L (ref 19–32)
CO2: 23 mEq/L (ref 19–32)
CO2: 24 mEq/L (ref 19–32)
CO2: 27 mEq/L (ref 19–32)
Calcium: 7.2 mg/dL — ABNORMAL LOW (ref 8.4–10.5)
Calcium: 8.3 mg/dL — ABNORMAL LOW (ref 8.4–10.5)
Calcium: 8.5 mg/dL (ref 8.4–10.5)
Calcium: 8.6 mg/dL (ref 8.4–10.5)
Chloride: 104 mEq/L (ref 96–112)
Chloride: 105 mEq/L (ref 96–112)
Chloride: 107 mEq/L (ref 96–112)
Chloride: 107 mEq/L (ref 96–112)
Chloride: 109 mEq/L (ref 96–112)
Chloride: 113 mEq/L — ABNORMAL HIGH (ref 96–112)
Creatinine, Ser: 1.71 mg/dL — ABNORMAL HIGH (ref 0.4–1.2)
Creatinine, Ser: 1.78 mg/dL — ABNORMAL HIGH (ref 0.4–1.2)
Creatinine, Ser: 1.83 mg/dL — ABNORMAL HIGH (ref 0.4–1.2)
Creatinine, Ser: 2.03 mg/dL — ABNORMAL HIGH (ref 0.4–1.2)
Creatinine, Ser: 2.1 mg/dL — ABNORMAL HIGH (ref 0.4–1.2)
Creatinine, Ser: 2.12 mg/dL — ABNORMAL HIGH (ref 0.4–1.2)
GFR calc Af Amer: 25 mL/min — ABNORMAL LOW (ref >60)
GFR calc Af Amer: 27 mL/min — ABNORMAL LOW (ref >60)
GFR calc Af Amer: 27 mL/min — ABNORMAL LOW (ref >60)
GFR calc Af Amer: 28 mL/min — ABNORMAL LOW (ref >60)
GFR calc Af Amer: 30 mL/min — ABNORMAL LOW (ref >60)
GFR calc Af Amer: 32 mL/min — ABNORMAL LOW (ref >60)
GFR calc Af Amer: 33 mL/min — ABNORMAL LOW (ref >60)
GFR calc Af Amer: 35 mL/min — ABNORMAL LOW (ref >60)
GFR calc non Af Amer: 21 mL/min — ABNORMAL LOW (ref >60)
GFR calc non Af Amer: 24 mL/min — ABNORMAL LOW (ref >60)
GFR calc non Af Amer: 26 mL/min — ABNORMAL LOW (ref >60)
Glucose, Bld: 166 mg/dL — ABNORMAL HIGH (ref 70–99)
Glucose, Bld: 181 mg/dL — ABNORMAL HIGH (ref 70–99)
Glucose, Bld: 209 mg/dL — ABNORMAL HIGH (ref 70–99)
Glucose, Bld: 215 mg/dL — ABNORMAL HIGH (ref 70–99)
Potassium: 3.7 mEq/L (ref 3.5–5.1)
Potassium: 4.4 mEq/L (ref 3.5–5.1)
Potassium: 4.5 mEq/L (ref 3.5–5.1)
Sodium: 134 mEq/L — ABNORMAL LOW (ref 135–145)
Sodium: 136 mEq/L (ref 135–145)
Sodium: 136 mEq/L (ref 135–145)
Sodium: 138 mEq/L (ref 135–145)
Sodium: 140 mEq/L (ref 135–145)
Sodium: 143 mEq/L (ref 135–145)

## 2011-03-01 LAB — URINALYSIS, ROUTINE W REFLEX MICROSCOPIC
Nitrite: NEGATIVE
Specific Gravity, Urine: 1.024 (ref 1.005–1.030)
pH: 5 (ref 5.0–8.0)

## 2011-03-01 LAB — GLUCOSE, CAPILLARY
Glucose-Capillary: 108 mg/dL — ABNORMAL HIGH (ref 70–99)
Glucose-Capillary: 112 mg/dL — ABNORMAL HIGH (ref 70–99)
Glucose-Capillary: 125 mg/dL — ABNORMAL HIGH (ref 70–99)
Glucose-Capillary: 128 mg/dL — ABNORMAL HIGH (ref 70–99)
Glucose-Capillary: 129 mg/dL — ABNORMAL HIGH (ref 70–99)
Glucose-Capillary: 130 mg/dL — ABNORMAL HIGH (ref 70–99)
Glucose-Capillary: 132 mg/dL — ABNORMAL HIGH (ref 70–99)
Glucose-Capillary: 134 mg/dL — ABNORMAL HIGH (ref 70–99)
Glucose-Capillary: 141 mg/dL — ABNORMAL HIGH (ref 70–99)
Glucose-Capillary: 142 mg/dL — ABNORMAL HIGH (ref 70–99)
Glucose-Capillary: 145 mg/dL — ABNORMAL HIGH (ref 70–99)
Glucose-Capillary: 161 mg/dL — ABNORMAL HIGH (ref 70–99)
Glucose-Capillary: 170 mg/dL — ABNORMAL HIGH (ref 70–99)
Glucose-Capillary: 183 mg/dL — ABNORMAL HIGH (ref 70–99)
Glucose-Capillary: 184 mg/dL — ABNORMAL HIGH (ref 70–99)
Glucose-Capillary: 184 mg/dL — ABNORMAL HIGH (ref 70–99)
Glucose-Capillary: 184 mg/dL — ABNORMAL HIGH (ref 70–99)
Glucose-Capillary: 185 mg/dL — ABNORMAL HIGH (ref 70–99)
Glucose-Capillary: 187 mg/dL — ABNORMAL HIGH (ref 70–99)
Glucose-Capillary: 191 mg/dL — ABNORMAL HIGH (ref 70–99)
Glucose-Capillary: 192 mg/dL — ABNORMAL HIGH (ref 70–99)
Glucose-Capillary: 194 mg/dL — ABNORMAL HIGH (ref 70–99)
Glucose-Capillary: 203 mg/dL — ABNORMAL HIGH (ref 70–99)
Glucose-Capillary: 212 mg/dL — ABNORMAL HIGH (ref 70–99)
Glucose-Capillary: 214 mg/dL — ABNORMAL HIGH (ref 70–99)
Glucose-Capillary: 234 mg/dL — ABNORMAL HIGH (ref 70–99)
Glucose-Capillary: 243 mg/dL — ABNORMAL HIGH (ref 70–99)

## 2011-03-01 LAB — CARDIAC PANEL(CRET KIN+CKTOT+MB+TROPI)
Relative Index: 0.7 (ref 0.0–2.5)
Total CK: 482 U/L — ABNORMAL HIGH (ref 7–177)
Total CK: 576 U/L — ABNORMAL HIGH (ref 7–177)
Troponin I: 1.32 ng/mL (ref 0.00–0.06)

## 2011-03-01 LAB — URINE CULTURE

## 2011-03-01 LAB — VANCOMYCIN, TROUGH: Vancomycin Tr: 18.1 ug/mL (ref 10.0–20.0)

## 2011-03-01 LAB — TROPONIN I: Troponin I: 1.37 ng/mL (ref 0.00–0.06)

## 2011-03-01 LAB — URINE MICROSCOPIC-ADD ON

## 2011-03-01 LAB — SODIUM, URINE, RANDOM: Sodium, Ur: 20 mEq/L

## 2011-03-02 LAB — GLUCOSE, CAPILLARY
Glucose-Capillary: 159 mg/dL — ABNORMAL HIGH (ref 70–99)
Glucose-Capillary: 137 mg/dL — ABNORMAL HIGH (ref 70–99)
Glucose-Capillary: 203 mg/dL — ABNORMAL HIGH (ref 70–99)
Glucose-Capillary: 240 mg/dL — ABNORMAL HIGH (ref 70–99)
Glucose-Capillary: 242 mg/dL — ABNORMAL HIGH (ref 70–99)
Glucose-Capillary: 275 mg/dL — ABNORMAL HIGH (ref 70–99)

## 2011-03-03 LAB — URINE MICROSCOPIC-ADD ON

## 2011-03-03 LAB — CBC
HCT: 33.8 % — ABNORMAL LOW (ref 36.0–46.0)
MCHC: 35.5 g/dL (ref 30.0–36.0)
MCV: 81.6 fL (ref 78.0–100.0)
Platelets: 238 10*3/uL (ref 150–400)
RDW: 15 % (ref 11.5–15.5)
WBC: 7.2 10*3/uL (ref 4.0–10.5)

## 2011-03-03 LAB — BASIC METABOLIC PANEL
BUN: 14 mg/dL (ref 6–23)
BUN: 16 mg/dL (ref 6–23)
CO2: 28 mEq/L (ref 19–32)
Calcium: 8.9 mg/dL (ref 8.4–10.5)
Calcium: 9.6 mg/dL (ref 8.4–10.5)
Chloride: 98 mEq/L (ref 96–112)
Creatinine, Ser: 1.06 mg/dL (ref 0.4–1.2)
Creatinine, Ser: 1.01 mg/dL (ref 0.4–1.2)
GFR calc Af Amer: 50 mL/min — ABNORMAL LOW (ref >60)
GFR calc non Af Amer: 41 mL/min — ABNORMAL LOW (ref >60)
GFR calc non Af Amer: 52 mL/min — ABNORMAL LOW (ref >60)
Glucose, Bld: 215 mg/dL — ABNORMAL HIGH (ref 70–99)
Potassium: 3.6 mEq/L (ref 3.5–5.1)
Sodium: 135 mEq/L (ref 135–145)

## 2011-03-03 LAB — GLUCOSE, CAPILLARY
Glucose-Capillary: 194 mg/dL — ABNORMAL HIGH (ref 70–99)
Glucose-Capillary: 203 mg/dL — ABNORMAL HIGH (ref 70–99)
Glucose-Capillary: 233 mg/dL — ABNORMAL HIGH (ref 70–99)
Glucose-Capillary: 246 mg/dL — ABNORMAL HIGH (ref 70–99)
Glucose-Capillary: 271 mg/dL — ABNORMAL HIGH (ref 70–99)

## 2011-03-03 LAB — URINE CULTURE: Colony Count: >=100000

## 2011-03-03 LAB — URINALYSIS, ROUTINE W REFLEX MICROSCOPIC
Bilirubin Urine: NEGATIVE
Glucose, UA: NEGATIVE mg/dL
Protein, ur: 30 mg/dL — AB
Specific Gravity, Urine: 1.025 (ref 1.005–1.030)
Specific Gravity, Urine: 1.015 (ref 1.005–1.030)
Urobilinogen, UA: 0.2 mg/dL (ref 0.0–1.0)
pH: 5.5 (ref 5.0–8.0)

## 2011-03-03 LAB — DIFFERENTIAL
Basophils Absolute: 0.1 10*3/uL (ref 0.0–0.1)
Basophils Relative: 0 % (ref 0–1)
Eosinophils Absolute: 0.4 10*3/uL (ref 0.0–0.7)
Eosinophils Absolute: 0.7 10*3/uL (ref 0.0–0.7)
Eosinophils Relative: 5 % (ref 0–5)
Lymphocytes Relative: 26 % (ref 12–46)
Monocytes Absolute: 0.6 10*3/uL (ref 0.1–1.0)
Monocytes Relative: 8 % (ref 3–12)
Neutro Abs: 6.6 10*3/uL (ref 1.7–7.7)
Neutrophils Relative %: 61 % (ref 43–77)

## 2011-03-03 LAB — POCT CARDIAC MARKERS: CKMB, poc: 1.4 ng/mL (ref 1.0–8.0)

## 2011-03-03 LAB — CARDIAC PANEL(CRET KIN+CKTOT+MB+TROPI)
CK, MB: 1.1 ng/mL (ref 0.3–4.0)
Relative Index: INVALID (ref 0.0–2.5)
Total CK: 35 U/L (ref 7–177)
Troponin I: 0.08 ng/mL — ABNORMAL HIGH (ref 0.00–0.06)
Troponin I: 0.09 ng/mL — ABNORMAL HIGH (ref 0.00–0.06)

## 2011-03-03 LAB — CK TOTAL AND CKMB (NOT AT ARMC): Relative Index: INVALID (ref 0.0–2.5)

## 2011-03-16 LAB — GLUCOSE, CAPILLARY
Glucose-Capillary: 134 mg/dL — ABNORMAL HIGH (ref 70–99)
Glucose-Capillary: 138 mg/dL — ABNORMAL HIGH (ref 70–99)
Glucose-Capillary: 169 mg/dL — ABNORMAL HIGH (ref 70–99)
Glucose-Capillary: 192 mg/dL — ABNORMAL HIGH (ref 70–99)
Glucose-Capillary: 197 mg/dL — ABNORMAL HIGH (ref 70–99)
Glucose-Capillary: 199 mg/dL — ABNORMAL HIGH (ref 70–99)
Glucose-Capillary: 202 mg/dL — ABNORMAL HIGH (ref 70–99)

## 2011-03-16 LAB — BASIC METABOLIC PANEL
BUN: 12 mg/dL (ref 6–23)
BUN: 18 mg/dL (ref 6–23)
BUN: 23 mg/dL (ref 6–23)
BUN: 23 mg/dL (ref 6–23)
CO2: 22 mEq/L (ref 19–32)
CO2: 25 mEq/L (ref 19–32)
CO2: 27 mEq/L (ref 19–32)
Calcium: 8.3 mg/dL — ABNORMAL LOW (ref 8.4–10.5)
Calcium: 8.4 mg/dL (ref 8.4–10.5)
Chloride: 100 mEq/L (ref 96–112)
Chloride: 93 mEq/L — ABNORMAL LOW (ref 96–112)
Chloride: 99 mEq/L (ref 96–112)
Creatinine, Ser: 1.3 mg/dL — ABNORMAL HIGH (ref 0.4–1.2)
Creatinine, Ser: 1.49 mg/dL — ABNORMAL HIGH (ref 0.4–1.2)
Creatinine, Ser: 1.73 mg/dL — ABNORMAL HIGH (ref 0.4–1.2)
GFR calc Af Amer: 34 mL/min — ABNORMAL LOW (ref >60)
GFR calc Af Amer: 41 mL/min — ABNORMAL LOW (ref >60)
Glucose, Bld: 196 mg/dL — ABNORMAL HIGH (ref 70–99)
Glucose, Bld: 201 mg/dL — ABNORMAL HIGH (ref 70–99)
Potassium: 4.6 mEq/L (ref 3.5–5.1)
Sodium: 130 mEq/L — ABNORMAL LOW (ref 135–145)

## 2011-03-16 LAB — URINALYSIS, MICROSCOPIC ONLY
Glucose, UA: 100 mg/dL — AB
Specific Gravity, Urine: 1.024 (ref 1.005–1.030)
Urobilinogen, UA: 1 mg/dL (ref 0.0–1.0)
pH: 5.5 (ref 5.0–8.0)

## 2011-03-16 LAB — CROSSMATCH: DAT, IgG: NEGATIVE

## 2011-03-16 LAB — CBC
MCHC: 33.2 g/dL (ref 30.0–36.0)
MCHC: 33.9 g/dL (ref 30.0–36.0)
MCHC: 34 g/dL (ref 30.0–36.0)
MCHC: 34.3 g/dL (ref 30.0–36.0)
MCV: 90.6 fL (ref 78.0–100.0)
MCV: 90.8 fL (ref 78.0–100.0)
MCV: 91.9 fL (ref 78.0–100.0)
Platelets: 175 10*3/uL (ref 150–400)
Platelets: 192 10*3/uL (ref 150–400)
Platelets: 206 10*3/uL (ref 150–400)
Platelets: 209 10*3/uL (ref 150–400)
RBC: 2.88 MIL/uL — ABNORMAL LOW (ref 3.87–5.11)
RBC: 3.09 MIL/uL — ABNORMAL LOW (ref 3.87–5.11)
RDW: 13.1 % (ref 11.5–15.5)
RDW: 13.2 % (ref 11.5–15.5)
WBC: 17.6 10*3/uL — ABNORMAL HIGH (ref 4.0–10.5)

## 2011-03-16 LAB — CULTURE, BLOOD (ROUTINE X 2)

## 2011-03-16 LAB — DIFFERENTIAL
Basophils Relative: 0 % (ref 0–1)
Eosinophils Absolute: 0 10*3/uL (ref 0.0–0.7)
Eosinophils Absolute: 0 10*3/uL (ref 0.0–0.7)
Lymphs Abs: 1 10*3/uL (ref 0.7–4.0)
Monocytes Absolute: 0.6 10*3/uL (ref 0.1–1.0)
Monocytes Absolute: 1.1 10*3/uL — ABNORMAL HIGH (ref 0.1–1.0)
Monocytes Relative: 7 % (ref 3–12)
Neutro Abs: 13.4 10*3/uL — ABNORMAL HIGH (ref 1.7–7.7)
Neutrophils Relative %: 94 % — ABNORMAL HIGH (ref 43–77)

## 2011-03-16 LAB — URINALYSIS, ROUTINE W REFLEX MICROSCOPIC
Glucose, UA: 250 mg/dL — AB
Ketones, ur: 15 mg/dL — AB
Nitrite: NEGATIVE
Protein, ur: >300 mg/dL — AB

## 2011-03-16 LAB — TROPONIN I: Troponin I: 0.18 ng/mL — ABNORMAL HIGH (ref 0.00–0.06)

## 2011-03-16 LAB — POCT I-STAT 7, (LYTES, BLD GAS, ICA,H+H)
Acid-base deficit: 3 mmol/L — ABNORMAL HIGH (ref 0.0–2.0)
Calcium, Ion: 1.15 mmol/L (ref 1.12–1.32)
HCT: 30 % — ABNORMAL LOW (ref 36.0–46.0)
Hemoglobin: 10.2 g/dL — ABNORMAL LOW (ref 12.0–15.0)
Sodium: 133 mEq/L — ABNORMAL LOW (ref 135–145)
pH, Arterial: 7.284 — ABNORMAL LOW (ref 7.350–7.400)
pO2, Arterial: 364 mmHg — ABNORMAL HIGH (ref 80.0–100.0)

## 2011-03-16 LAB — CARDIAC PANEL(CRET KIN+CKTOT+MB+TROPI)
CK, MB: 1.6 ng/mL (ref 0.3–4.0)
Relative Index: 1.6 (ref 0.0–2.5)
Total CK: 100 U/L (ref 7–177)
Troponin I: 0.13 ng/mL — ABNORMAL HIGH (ref 0.00–0.06)

## 2011-03-16 LAB — CK TOTAL AND CKMB (NOT AT ARMC)
CK, MB: 2.2 ng/mL (ref 0.3–4.0)
Relative Index: 1.5 (ref 0.0–2.5)

## 2011-03-16 LAB — URINE CULTURE: Colony Count: 75000

## 2011-03-16 LAB — BRAIN NATRIURETIC PEPTIDE
Pro B Natriuretic peptide (BNP): 243 pg/mL — ABNORMAL HIGH (ref 0.0–100.0)
Pro B Natriuretic peptide (BNP): 451 pg/mL — ABNORMAL HIGH (ref 0.0–100.0)

## 2011-03-16 LAB — URINE MICROSCOPIC-ADD ON

## 2011-03-16 LAB — COMPREHENSIVE METABOLIC PANEL
ALT: 24 U/L (ref 0–35)
Albumin: 2.4 g/dL — ABNORMAL LOW (ref 3.5–5.2)
Alkaline Phosphatase: 102 U/L (ref 39–117)
Calcium: 8.5 mg/dL (ref 8.4–10.5)
Potassium: 4.1 mEq/L (ref 3.5–5.1)
Sodium: 135 mEq/L (ref 135–145)
Total Protein: 6.4 g/dL (ref 6.0–8.3)

## 2011-03-16 LAB — PROTIME-INR
INR: 1.49 (ref 0.00–1.49)
Prothrombin Time: 17.9 seconds — ABNORMAL HIGH (ref 11.6–15.2)

## 2011-03-18 ENCOUNTER — Ambulatory Visit (INDEPENDENT_AMBULATORY_CARE_PROVIDER_SITE_OTHER): Payer: Medicare Other | Admitting: Vascular Surgery

## 2011-03-18 DIAGNOSIS — I70219 Atherosclerosis of native arteries of extremities with intermittent claudication, unspecified extremity: Secondary | ICD-10-CM

## 2011-03-18 LAB — POCT I-STAT, CHEM 8
BUN: 15 mg/dL (ref 6–23)
Chloride: 101 mEq/L (ref 96–112)
Creatinine, Ser: 0.9 mg/dL (ref 0.4–1.2)
Glucose, Bld: 181 mg/dL — ABNORMAL HIGH (ref 70–99)
HCT: 42 % (ref 36.0–46.0)
Potassium: 3.9 mEq/L (ref 3.5–5.1)

## 2011-03-19 NOTE — Assessment & Plan Note (Signed)
OFFICE VISIT  Dunson, Trenae L DOB:  1926-06-30                                       03/18/2011 ZOXWR#:[id number]  I saw this patient in the office today concerning pain in her right foot.  This is a pleasant 77 year old woman who has undergone a previous right superficial femoral artery to peroneal artery bypass graft with a vein graft in 1996.  She has also had a left above-the-knee amputation in December 2010.  She developed a gradual onset of pain in her right foot approximately 3 weeks ago.  She does not remember any specific injury to her foot.  She does have a history of gout.  The pain is constant in character with no aggravating or alleviating factors.  She has had no fever that she is aware of.  SOCIAL HISTORY:  She is widowed.  She has 3 children.  She not use tobacco.  REVIEW OF SYSTEMS:  CARDIOVASCULAR:  She had no chest pain, chest pressure, palpitations or arrhythmias. PULMONARY:  She had no productive cough bronchitis, asthma or wheezing.  PHYSICAL EXAMINATION:  This is a pleasant 77 year old woman who appears her stated age.  Blood pressure is 159/84, heart rate is 82, respiratory rate is 24.  Lungs:  Clear bilaterally to auscultation.  Cardiovascular exam:  She has palpable femoral pulses, a palpable graft pulse on the right side.  She has a monophasic dorsalis pedis signal with a Doppler on the right.  The foot appears adequately perfused.  It is slightly red but there is no tenderness to palpation.  There are no open wounds that I can see.  Given that she has a palpable graft pulse, I do not think that she has occluded her bypass graft in the right leg.  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  X-ray in 2010, showed single vessel runoff via the peroneal artery on the right with occluded anterior tibial and posterior tibial arteries.  I have explained that her pain in the foot could potentially be related to gout.   It does not appear that she has had a sudden occlusion of her bypass graft as she does have a palpable pulse.  She may have progression of her distal disease, however, it is unlikely that there are any further options for revascularization.  We discussed the option of proceeding with arteriography.  She is somewhat reluctant.  I plan on seeing her back in 3 weeks.  We will take an x-ray of the foot to rule out any evidence of osteomyelitis.  If her pain progresses I think we need to proceed with arteriography  and also consider MRI of the foot. She knows to call sooner if she has problems.    Di Kindle. Edilia Bo, M.D. Electronically Signed  CSD/MEDQ  D:  03/18/2011  T:  03/19/2011  Job:  [id number]  cc:   Charlynne Pander Angus G. Renard Matter, MD

## 2011-03-20 LAB — URINE CULTURE

## 2011-03-20 LAB — DIFFERENTIAL
Basophils Relative: 0 % (ref 0–1)
Eosinophils Relative: 4 % (ref 0–5)
Lymphocytes Relative: 16 % (ref 12–46)
Monocytes Absolute: 0.6 10*3/uL (ref 0.1–1.0)
Monocytes Relative: 6 % (ref 3–12)
Neutro Abs: 7.7 10*3/uL (ref 1.7–7.7)

## 2011-03-20 LAB — BASIC METABOLIC PANEL
CO2: 32 mEq/L (ref 19–32)
Calcium: 9.8 mg/dL (ref 8.4–10.5)
GFR calc Af Amer: >60 mL/min (ref >60)
Glucose, Bld: 218 mg/dL — ABNORMAL HIGH (ref 70–99)
Potassium: 4 mEq/L (ref 3.5–5.1)
Sodium: 136 mEq/L (ref 135–145)

## 2011-03-20 LAB — CBC
HCT: 39.4 % (ref 36.0–46.0)
Hemoglobin: 13.6 g/dL (ref 12.0–15.0)
MCHC: 34.4 g/dL (ref 30.0–36.0)
RBC: 4.35 MIL/uL (ref 3.87–5.11)
RDW: 13 % (ref 11.5–15.5)

## 2011-03-20 LAB — URINE MICROSCOPIC-ADD ON

## 2011-03-20 LAB — URINALYSIS, ROUTINE W REFLEX MICROSCOPIC
Glucose, UA: NEGATIVE mg/dL
Specific Gravity, Urine: 1.015 (ref 1.005–1.030)
pH: 7 (ref 5.0–8.0)

## 2011-03-26 LAB — CBC
HCT: 38.7 % (ref 36.0–46.0)
MCHC: 33.9 g/dL (ref 30.0–36.0)
MCV: 88.4 fL (ref 78.0–100.0)
RBC: 4.38 MIL/uL (ref 3.87–5.11)
WBC: 5.5 10*3/uL (ref 4.0–10.5)

## 2011-03-26 LAB — BASIC METABOLIC PANEL
CO2: 27 mEq/L (ref 19–32)
Chloride: 104 mEq/L (ref 96–112)
GFR calc Af Amer: 46 mL/min — ABNORMAL LOW (ref >60)
Potassium: 4.4 mEq/L (ref 3.5–5.1)

## 2011-03-26 LAB — CREATININE, SERUM: GFR calc non Af Amer: 55 mL/min — ABNORMAL LOW (ref >60)

## 2011-03-30 ENCOUNTER — Ambulatory Visit
Admission: RE | Admit: 2011-03-30 | Discharge: 2011-03-30 | Disposition: A | Discharge status: Home / Self Care | Payer: Medicare Other | Source: Ambulatory Visit | Attending: Vascular Surgery | Admitting: Vascular Surgery

## 2011-03-30 ENCOUNTER — Other Ambulatory Visit: Payer: Self-pay | Admitting: Vascular Surgery

## 2011-03-30 DIAGNOSIS — I739 Peripheral vascular disease, unspecified: Secondary | ICD-10-CM

## 2011-04-08 ENCOUNTER — Ambulatory Visit (INDEPENDENT_AMBULATORY_CARE_PROVIDER_SITE_OTHER): Payer: Medicare Other | Admitting: Vascular Surgery

## 2011-04-08 DIAGNOSIS — M8668 Other chronic osteomyelitis, other site: Secondary | ICD-10-CM

## 2011-04-09 NOTE — Assessment & Plan Note (Signed)
OFFICE VISIT  [name], [name] L DOB:  20-Dec-1925                                       04/08/2011 ZOXWR#:[id number]  I saw patient in the office today for continued follow-up of the pain in her right foot.  I saw her on March 18, 2011 when she had developed some pain in the right foot.  She does not remember any specific injury to her foot.  She does have a history of gout.  She has had no fever.  She has undergone a previous right femoral to peroneal artery bypass graft and has known severe tibial occlusive disease with single-vessel runoff via the peroneal artery.  She has had evidence of progression of this disease.  I had her come back for a follow-up visit, and also we obtained an x-ray of the foot.  Since I saw her last, she states that she dropped a bed rail on her right foot, and since that time she has had some redness in the foot. She has had no fever, and she has noted no drainage from the foot or open ulcers except for a small abrasion where the bed rail hit her foot. Her activities are very limited.  She has an AKA on the left and is essentially nonambulatory.  She does put some weight on the right foot.  REVIEW OF SYSTEMS:  She has had no chest pain, chest pressure, or palpitations.  PHYSICAL EXAMINATION:  This is a pleasant 77 year old woman who appears her stated age.  Her lungs are clear bilaterally to auscultation without rales, rhonchi, or wheezing.  Cardiovascular:  She has a regular rate and rhythm.  She has a well-perfused foot with good venous filling. There is mild erythema in the foot, although there are no open ulcers.  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  She does have a nondisplaced fracture of the distal phalanx of the great toe.  Given the erythema in the foot with no open ulcer, I have put her on 10 days of Keflex.  I have instructed her to try to  keep weight off the foot as best she can.  I will plan on seeing her back in 3 weeks so we can follow her foot closely.  We will also duplex her graft which she comes back.  She did have a graft duplex in January which showed that her graft was patent with elevated velocities distally, likely due to progression of her tibial disease.  She knows to call sooner if she develops any fever or increased erythema in the foot or swelling.    Di Kindle. Edilia Bo, M.D. Electronically Signed  CSD/MEDQ  D:  04/08/2011  T:  04/09/2011  Job:  [id number]

## 2011-04-28 NOTE — Procedures (Signed)
BYPASS GRAFT EVALUATION   INDICATION:  Follow-up evaluation of bilateral bypass grafts.  Patient  has no new complaints at this time.   HISTORY:  Diabetes:  Yes.  Cardiac:  MI.  Hypertension:  Yes.  Smoking:  No.  Previous Surgery:  Revision of left fem-PTA BPG vein stenosis with LSV  and vein angioplasty on 04/14/02 by Dr. Edilia Bo.  Right fem-peroneal  artery BPG with in situ saphenous vein by Dr. Hart Rochester on 10/07/95.   SINGLE LEVEL ARTERIAL EXAM                               RIGHT              LEFT  Brachial:                    190                Mastectomy  Anterior tibial:             210                180  Posterior tibial:            206                176  Peroneal:  Ankle/brachial index:        >1.0               0.95   PREVIOUS ABI:  Date: 11/22/07  RIGHT:  >1.0  LEFT:  0.90   LOWER EXTREMITY BYPASS GRAFT DUPLEX EXAM:   DUPLEX:  1. Patent right CFA without evidence of stenosis.  2. Patent right fem-peroneal artery BPG with velocities ranging from      46 cm/s - 69 cm/s without evidence of stenosis.  3. Patent distal peroneal artery.  4. Increased PSV of 220 cm/s noted at left CFA.  5. Patent left fem-PTA BPG with velocities ranging from 53 cm/s - 273      cm/s with highest velocity noted at the proximal calf, which is      consistent with a 50-75% stenosis.  6. Patent distal native PTA.   IMPRESSION:  1. Stable ankle brachial indices bilaterally.  2. Patent right common femoral artery without evidence of stenosis.  3. Patent right bypass graft without evidence of stenosis.  4. Patent distal native peroneal artery.  5. Left common femoral artery with an increased peak systolic velocity      of 220 cm/s.  6. Patent left bypass graft with an increased peak systolic velocity      of 273 cm/s noted at the proximal calf, which is consistent with a      50-75% stenosis.  7. Patent distal posterior tibial artery.   ___________________________________________  Di Kindle. Edilia Bo, M.D.   PB/MEDQ  D:  05/23/2008  T:  05/23/2008  Job:  [id number]

## 2011-04-28 NOTE — Assessment & Plan Note (Signed)
OFFICE VISIT   Rosten, Siniya L  DOB:  [birth year]                                       10/03/2009  ZOXWR#:[id number]   I saw the patient in the office today with a chief complaint of an ulcer  on the left foot.  She has known peripheral vascular disease and has  undergone bypasses in both legs.  Approximately 6-8 weeks ago she  developed an ulcer on the medial aspect of her first metatarsal head of  the left foot.  She has been followed by Dr. Ulice Brilliant and ultimately was  seen by Dr. Tanda Rockers who referred her for vascular evaluation.  She has  had no real pain associated with the ulcer on her left foot.  I do not  get any clear-cut history of claudication although her activities are  very limited.  I get no history of rest pain.  She has had no fever or  chills that she is aware of.   PAST MEDICAL HISTORY:  Significant for diabetes which has been stable on  her current medications.  In addition, she has a history of neuropathy  which has also been stable.  She has burning pain in both feet.  She has  coronary artery disease and has had occasional chest pain and has had no  change in the character of her symptoms.  She is followed by Dr. Daleen Squibb.  Her hypertension and hypercholesterolemia have also been stable on her  current medications.  She has a history of esophageal stricture and has  undergone dilatation of this in the past.  She is unaware of any history  of congestive heart failure or history of emphysema.   PAST SURGICAL HISTORY:  Significant for previous right superficial  femoral artery to peroneal artery bypass with a vein graft in 1996.  She  had a left femoral to distal posterior tibial artery bypass graft with a  vein graft in 2000.  She later had a vein patch angioplasty of a vein  graft stenosis in the left femoral bypass graft in May of 2003.  In  addition, she has had a mastectomy and a hysterectomy.   FAMILY HISTORY:  There is no history of  premature cardiovascular  disease.   SOCIAL HISTORY:  She is widowed.  She has three children.  She does not  use tobacco.   REVIEW OF SYSTEMS:  She has had some loss of appetite.  She is 215  pounds, 5 feet 4 inches tall.  CARDIAC:  She has had no chest pain or chest pressure.  She does admit  to some dyspnea on exertion and occasional orthopnea.  PULMONARY:  She has had no productive cough, bronchitis, asthma or  wheezing.  GI:  She does have a history of reflux and has also had problems of  swallowing in the past.  MUSCULOSKELETAL:  She has significant arthritis and joint pain and  muscle pain.  HEMATOLOGIC:  She has had no bleeding problems that she is aware of.   PHYSICAL EXAMINATION:  General:  This is a pleasant 77 year old woman  who appears her stated age.  Vital signs:  Blood pressure is 128/75,  heart rate is 79, respiratory rate is 18.  Neck:  Is supple.  There is  no cervical lymphadenopathy.  I do not detect any carotid bruits.  Lungs:  Clear  bilaterally to auscultation.  Cardiac:  She has a regular  rate and rhythm without murmur appreciated.  Vascular:  Reveals palpable  femoral pulses bilaterally.  I cannot palpate popliteal or pedal pulses  on either side.  She has mild peripheral edema bilaterally.  Abdomen:  Soft and nontender with normal pitched bowel sounds.  Neurologic:  Nonfocal with no focal weakness or paresthesias.   I independently reviewed her arterial Doppler study today which shows  monophasic signals in the posterior tibial and dorsalis pedis position  on the right with an ABI of 100% although this may be falsely elevated  because of her calcific disease.  However, the toe pressure of 103 mmHg  on the right suggests adequate perfusion.  On the left side she has a  monophasic peroneal and posterior tibial signal with an ABI of 69%.  No  flow was noted in the left great toe on her digital pressure.  There has  been no significant change in her ABIs  compared to her previous study  back in July of 2010 except that the left pressure is actually higher  although again this may be falsely elevated.  She has had previously  elevated velocities at the distal aspect of her fem posterior tibial  bypass graft on the left but did not wish to pursue any further surgery  from that standpoint.   The patient does have a superficial dry ulcer over the metatarsal head  of the left great toe on the medial aspect with currently no significant  drainage.  There is some mild erythema of the foot and the left foot is  slightly cooler than the right.   Given the wound on the left foot with evidence of significant  infrainguinal arterial occlusive disease I have recommended we obtain an  x-ray of the foot to rule out underlying osteomyelitis.  In addition, I  have recommended we proceed with a CT angio to evaluate the circulation  on the left especially.  I am reluctant to do an arteriogram and  potentially compromise her grafts on both sides.  Will make further  recommendations pending results of her arteriogram.  Clearly she has a  limb threatening situation given the wound on the foot with evidence of  significant infrainguinal arterial occlusive disease in addition to her  history of diabetes.  She certainly is at increased risk for any major  intervention given her age and multiple medical issues.   Di Kindle. Edilia Bo, M.D.  Electronically Signed   CSD/MEDQ  D:  75/21/2010  T:  10/04/2009  Job:  [id number]   cc:   Angus G. Renard Matter, MD  Theresia Majors Tanda Rockers, M.D.  Ulice Brilliant

## 2011-04-28 NOTE — Op Note (Signed)
[name], BASCOM                   ACCOUNT NO.:  [id number]   MEDICAL RECORD NO.:  [id number]          PATIENT TYPE:  AMB   LOCATION:  DAY                           FACILITY:  [facility]   PHYSICIAN:  Dennie Maizes, M.D.   DATE OF BIRTH:  03-19-1926   DATE OF PROCEDURE:  02/25/2009  DATE OF DISCHARGE:                               OPERATIVE REPORT   PREOPERATIVE DIAGNOSES:  Recurrent urinary tract infection, urethral  stenosis, rule out colovesical fistula.   POSTOPERATIVE DIAGNOSIS:  Recurrent urinary tract infection, urethral  stenosis.   OPERATIVE PROCEDURE:  Cystoscopy and urethral dilation.   ANESTHESIA:  Monitored anesthesia care.   SURGEON:  Rito Ehrlich   COMPLICATIONS:  None.   ESTIMATED BLOOD LOSS:  Minimal.   DRAINS:  None.   SPECIMEN:  None.   INDICATIONS FOR THE PROCEDURE:  This 77 year old female has recurrent  urinary tract infection.  Recent CT scan revealed lot of gas in the  bladder.  The patient was taken to the operating room today for  cystoscopy, reevaluate for bladder lesions and possible urethral  dilation.   DESCRIPTION OF THE PROCEDURE:  Under monitored anesthesia care, the  patient was placed on the cystoscopy table in the dorsal lithotomy  position.  The lower abdomen and genitalia were prepped and draped in a  sterile fashion.  A 25-French cystoscope sheath was inserted into the  bladder, and postvoid residual urine was about 200 mL.  Cystoscopy was  done with 30- and 70-degree lenses.  The urethra, trigone, bladder neck,  and ureteral orifice were completely normal.  There was no evidence of  any inflammation or tumor in the bladder.  There was no  evidence of any colovesical fistula.  No foreign body was noted inside  the bladder.  Bladder capacity was found to be adequate.  Urethra  calibrated to 22-French, dilation was done up to 30-French with the  straight metal sounds.  The patient tolerated the procedure well.  She  was transferred to the  PACU in a satisfactory condition.       Dennie Maizes, M.D.  Electronically Signed     SK/MEDQ  D:  02/25/2009  T:  02/26/2009  Job:  [id number]   cc:   Angus G. Renard Matter, MD  Fax: [phone]

## 2011-04-28 NOTE — H&P (Signed)
HISTORY AND PHYSICAL EXAMINATION   December 05, 2009   Re:  [name], [name]             DOB:  October 21, 1926   DATE OF PLANNED ADMISSION:  December 20, 2009.   REASON FOR ADMISSION:  Dry gangrene of the right third toe.   HISTORY:  This is a pleasant 77 year old woman with a long history of  peripheral vascular disease.  She has had previous bilateral lower  extremity bypass grafts.  In October 1996 she had a superficial femoral  artery to peroneal artery bypass graft with vein graft.  In April of  2000, she had a left femoral to distal posterior tibial artery bypass  graft with a vein graft.  She had a vein graft stenosis in her left leg  bypass graft repaired in May 2003.  Her most recent arteriogram in  November of this year shows that her bypass graft in the left is  occluded and she has severe tibial occlusive disease with really no  patent vessels distally and no options for revascularization.  She  developed multiple wounds on her left foot and, given that there are no  options for revascularization and these wounds are progressing, she is  admitted for left above the knee amputation.   PAST MEDICAL HISTORY:  Significant for type 2 diabetes, hypertension,  hypercholesterolemia, and a history of previous myocardial infarction in  1985.  She denies any history of congestive heart failure or history of  COPD.   PAST SURGICAL HISTORY:  1. Significant for previous vascular procedures as described above.  2. Hysterectomy.  3. Mastectomy.  4. Appendectomy.  5. Partial amputation of a finger.  6. Detached retina.   FAMILY HISTORY:  There is no history of premature cardiovascular  disease.   SOCIAL HISTORY:  She is widowed.  She has 3 children.  She does not use  tobacco.  She does not use alcohol.   ALLERGIES:  Sulfa, penicillin, aspirin, and codeine.   MEDICATIONS:  1. Travoprost 1 drop nightly.  2. Pilocarpine 1 drop 4 times a day.  3. Lantus insulin 40  units subcutaneous daily.  4. Cosopt 1 drop b.i.d.  5. Humalog sliding scale.  6. Systane drops p.r.n.  7. Valsartan 80 mg p.o. daily.  8. Lasix 20 mg p.o. daily.  9. Keflex 500 mg p.o. t.i.d.   REVIEW OF SYSTEMS:  GENERAL:  She has had some loss of appetite and a  low grade fever.  She has had no recent weight loss or weight gain.  She  is 215 pounds, 5 feet 4 inches tall.  CARDIAC:  She has had no chest pain, chest pressure, palpitations, or  arrhythmias.  She does admit to dyspnea on exertion.  GASTROINTESTINAL:  She has had reflux in the past.  She has had  occasional diarrhea and constipation.  GENITOURINARY:  She has had some dysuria and frequency.  VASCULAR:  She has claudication and rest pain, and nonhealing ulcers in  her left foot.  She has had a previous mini-stroke in the past.  She has  had no history of DVT.  NEUROLOGIC:  She has occasional dizziness.  MUSCULOSKELETAL:  She has arthritis, joint pain, and muscle pain.  PSYCHIATRIC:  She has a history of depression and anxiety.  ENT:  She has had some change in her eyesight and hearing over the last  year.  SKIN:  She has psoriasis.  PULMONARY:  Unremarkable.  HEMATOLOGIC:  Unremarkable.  PHYSICAL EXAMINATION:  This is a pleasant 77 year old woman who appears  her stated age.  Blood pressure is 151/84, heart rate is 89, temperature  is 98, respiratory rate 24.  HEENT, pupils are equal, round, and  reactive to light and accommodation.  Extraocular motions intact.  Conjunctivae normal.  Lungs are clear bilaterally to auscultation  without rhonchi, rales, or wheezing.  On cardiovascular exam, I did not  detect any carotid bruits.  She has a regular rate and rhythm without  murmur or gallop appreciated.  She has bilateral edema in her lower  extremities.  She has palpable femoral pulses.  I could not palpate  popliteal or pedal pulses on either side.  Abdomen is soft and nontender  with normal-pitched bowel sounds and  no masses appreciated.  Musculoskeletal and extremity exam reveals wounds involving the entire  left foot, all the toes are ischemic, most significantly the second,  third, and fourth toes.  She has ulceration between the toes and also an  ulcer over her first metatarsal head.  She has cracks on the dorsum of  her foot.  Neurologic, she has no focal weakness or paresthesias.  Lymphatic exam, she has no significant cervical, inguinal, or axillary  lymphadenopathy.   IMPRESSION:  This patient presents with nonhealing wounds of the left  foot with no options for revascularization.  In addition, she would be  at high risk for any revascularization attempts.  I think the safest  approach is primary above the knee amputation.  Given her leg swelling  and diffuse peripheral vascular disease, I think she has significant  risk of not healing a below the knee amputation and I think the above  knee amputation would be the simplest and safest approach.  Her surgery  has been scheduled for January 31.  She will call sooner if she has any  change in her exam.     Di Kindle. Edilia Bo, M.D.  Electronically Signed   CSD/MEDQ  D:  12/05/2009  T:  12/09/2009  Job:  [id number]

## 2011-04-28 NOTE — Assessment & Plan Note (Signed)
[facility]                            CARDIOLOGY OFFICE NOTE   NAME:Cuffe, EVIN CHIRCO                          MRN:          [id number]  DATE:10/22/2008                            DOB:          1926-09-25    HISTORY OF PRESENT ILLNESS:  Ms. Zehring comes in today for followup.   She complains of fatigue and just feeling tired all the time.  She does  not get around very well.  She has got a lot of arthritis particularly  in her right shoulder and arm.   She denies any angina or ischemic symptoms.   Her problem list, see the note from May 08, 2008.   MEDICATIONS:  Her meds are unchanged since her last visit.  Of note,  though she is not taking aspirin any longer.   PHYSICAL EXAMINATION:  VITAL SIGNS:  Today blood pressure is 126/60,  which is the best I have seen it ever.  Pulse is 83 and regular, weight  is 229, down 4.  GENERAL:  She is in no acute distress.  Alert and oriented x3.  SKIN:  Warm and dry.  HEENT:  Unchanged.  NECK:  Carotids are full without bruits.  Thyroid is not enlarged.  Trachea is midline.  LUNGS:  Clear to auscultation and percussion.  There is limited mobility  of her right arm.  HEART:  Reveals a regular rate and rhythm.  No gallop.  ABDOMEN:  Could not be evaluated because she is sitting in a wheelchair.  EXTREMITIES:  Reveal a 2+ edema on the left and 1+ on the right.  Pulses  are present, but reduced.  NEUROLOGIC:  Intact except for that she is in a wheelchair.   ASSESSMENT AND PLAN:  Ms. Vanscyoc is status quo.  We will check a Chem-7 and  make sure potassium is not low with all this fatigue and just feeling  poorly.  Otherwise, we will see her back again in 6 months.  I have made  no change in her program.     Maisie Fus C. Daleen Squibb, MD, [facility]  Electronically Signed    TCW/MedQ  DD: 10/22/2008  DT: 10/23/2008  Job #: [id number]   cc:   Angus G. Renard Matter, MD

## 2011-04-28 NOTE — Assessment & Plan Note (Signed)
 [facility]                            CARDIOLOGY OFFICE NOTE   NAME:[name], [name]                          MRN:          [id number]  DATE:04/20/2007                            DOB:          1926/03/26    [name] comes in today.  She was just discharged from [facility] after being admitted with nausea, vomiting and severe  dizziness.  Vestibular neuritis was diagnosed by Dr. 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  They placed her on Aggrenox but she said this  made her deathly sick.   She is taking aspirin 81 mg a day a couple of times a week it sounds.   She is having no ischemic chest pain or symptoms.  Her peripheral edema  has been largely resolved other than at the end of the day in the left  leg.   MEDICATIONS:  1. Lantus 75 units a day.  2. Humalog as directed.  3. Enalapril 20 mg a day.  4. Allopurinol 100 mg a day.  5. Cosopt drops.  6. Travatan drops.  7. Pilocarpine drops.  8. Furosemide 20 mg a day.  9. Potassium 20 mEq a day.  10.Atorvastatin 10 mg a day.  11.Enteric-coated aspirin intermittently.   Her blood pressure is excellent today at 136/78.  Pulse 76 and regular.  Weight is 224.  HEENT:  Normocephalic, atraumatic.  She is wearing glasses.  Pupils are  equal, round and reactive to light and accommodation.  Facial symmetry  is normal.  NECK:  Supple.  Carotid upstrokes are equal bilaterally without bruits.  There is no jugular venous distention.  Thyroid is enlarged.  Trachea is  midline.  LUNGS:  Clear.  HEART:  Reveals a poorly appreciated PMI.  Shows normal S1, S2.  ABDOMEN:  Soft, good bowel sounds.  Organomegaly could not be assessed.  EXTREMITIES:  Only trace edema on the left.  Pulses are actually 1+/4+  bilaterally with peripheral vascular disease.  NEUROLOGICAL EXAMINATION:  Grossly intact.   ASSESSMENT AND PLAN:  From a  cardiovascular standpoint and peripheral  vascular disease standpoint, [name] is stable.  I have strongly  encouraged her to take her aspirin at least 81 mg a day since she cannot  tolerate Aggrenox.  I have also made her aware of the MRA findings  which she was not clear on.  Her daughter was also present.  I plan on  seeing her back in six months.     Thomas C. Daleen Squibb, MD, [facility]  Electronically Signed    TCW/MedQ  DD: 04/20/2007  DT: 04/20/2007  Job #: [id number]   cc:   Catalina Pizza, M.D.

## 2011-04-28 NOTE — Assessment & Plan Note (Signed)
OFFICE VISIT   [name], [name] L  DOB:  [birth year]                                       03/12/2009  ZOXWR#:[id number]   I saw the patient in the office today for continued followup of her  peripheral vascular disease.  She was referred by Dr. Ulice Brilliant.  She had  developed some cracks on the plantar aspect of her left foot after she  had developed some significant foot swelling.  She has undergone a  previous right superficial femoral artery to peroneal artery bypass  graft with a saphenous vein graft in 1996.  In April of 2000 she had a  left femoral to distal posterior tibial artery bypass graft with a vein  graft.  In May of 2003 she had a vein patch angioplasty of a vein graft  stenosis in her left fem-tib bypass graft using lesser saphenous vein.  Of note, her activity is fairly limited but I do not get any clear-cut  history of claudication or rest pain.  She has had a known area of  increased velocity in her distal vein graft on the left at the  anastomosis.   PHYSICAL EXAMINATION:  On examination this is a pleasant 77 year old  woman who appears her stated age.  Blood pressure is 125/93, heart rate  is 82.  She has a palpable femoral pulse bilaterally and a palpable  graft pulse bilaterally.  She has brisk Doppler signals in both feet  with an ABI of 100% on the right and 82% on the left.  There is an area  of increased velocities at the distal anastomosis on the left which we  have been following, the velocities are higher this time compared to the  study 4 months ago.   The cracks at the base of the toes appear fairly superficial.  There is  one crack at the base of the first toe and another crack at the base of  the fifth toe.  Currently there is no significant drainage.  There is  mild cellulitis.   She has an ABI of 80% on the left and I think she has adequate  circulation to gradually heal these wounds which according to the  daughter are  gradually improving.  If the wounds progress or do not heal  I think we would have to proceed with arteriography to further evaluate  the bypass graft in the left leg for possible revision.  If she does  require arteriography it appears that the bypass graft in the right  originates from the proximal superficial femoral artery so we may be  able to get in above that and cross over to the left to study the left  leg graft.  However, currently the wound appears to be improving so we  will hold off on arteriography.  I plan on seeing her back in 4 months.  She knows to call sooner if the wound is not healing.   Di Kindle. Edilia Bo, M.D.  Electronically Signed   CSD/MEDQ  D:  03/12/2009  T:  03/13/2009  Job:  [id number]   cc:   Denny Peon. Ulice Brilliant, D.P.M.

## 2011-04-28 NOTE — Procedures (Signed)
BYPASS GRAFT EVALUATION   INDICATION:  Follow-up bilateral lower extremity bypass graft.   HISTORY:  Diabetes:  Yes.  Cardiac:  MI.  Hypertension:  Yes.  Smoking:  No.  Previous Surgery:  Revision of left femoral-posterior tibial artery  bypass graft on 04/14/2002 by Dr. Edilia Bo.  Femoral-peroneal artery  bypass graft by Dr. Hart Rochester on 10/07/1995.   SINGLE LEVEL ARTERIAL EXAM                               RIGHT              LEFT  Brachial:                    189                Mastectomy  Anterior tibial:             197                163  Posterior tibial:            167                190  Peroneal:                    197  Ankle/brachial index:        1.04               1.01   PREVIOUS ABI:  Date: 05/22/2008  RIGHT:  >1.0  LEFT:  0.95   LOWER EXTREMITY BYPASS GRAFT DUPLEX EXAM:   DUPLEX:  1. Patent right femoral-peroneal artery bypass graft with elevated      velocities of 187 cm/s in the distal anastomosis.  2. Patent left femoral-posterior tibial artery bypass graft with known      elevated velocities in the graft at the proximal calf of 214 cm/s      and distal anastomosis of 548 cm/s, which was not previously      documented.   IMPRESSION:  1. ABIs appear within normal limit and stable, however right biphasic      and monophasic waveforms were noted at the ankle, while the left      monophasic waveforms were noted.  2. Patent right femoral-peroneal artery bypass graft with stenosis      noted at the distal anastomosis.  3. Patent left femoral-posterior tibial artery bypass graft with      stenoses noted in graft at the proximal calf and distal      anastomosis.   ___________________________________________  Di Kindle. Edilia Bo, M.D.   AS/MEDQ  D:  11/27/2008  T:  11/27/2008  Job:  [id number]

## 2011-04-28 NOTE — Assessment & Plan Note (Signed)
OFFICE VISIT   Bernet, Maddi L  DOB:  1926-05-28                                       03/25/2010  ZOXWR#:[id number]   Date of surgery was December 12, 2009, and was a left AKA.  The patient  returns to clinic today complaining of bruising in her stump along the  incision line.  Of note, several days ago she fell and skinned her right  knee.  Her family member said several days later, a large bruise arose,  which is now resolved.  There is some hardness to the area which is  residual.  At this time, her diabetes, hypertension and coronary artery  disease are stable.  She is widowed.  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  She does not smoke or use alcoholic products.   REVIEW OF SYSTEMS:  Significant for occasional chest tightness, GERD,  history of mini-strokes, dizziness, arthritis, joint pain, changes in  hearing and some psoriasis.   PHYSICAL FINDINGS:  Heart rate was 80, blood pressure 148/40, O2  saturation 97%.  She was well-nourished and in no distress.  She was in  a wheelchair.  HEENT:  PERRLA, EOMI, normal conjunctivae, and mucous  membranes were pink and moist.  Lungs were clear to auscultation.  Cardiac exam revealed a regular rate and rhythm.  The abdomen was soft,  nontender, nondistended.  I evaluated her left stump.  There was no  bruising.  There was a very hard and firm area which was in the area of  the bruise.  I recommended that she continue to elevate the stump.  I  also recommended that she use the stump shrinker.  She was instructed to  return to clinic at her next stated appointment, which is in August.   Wilmon Arms, PA   Quita Skye. Hart Rochester, M.D.  Electronically Signed   KEL/MEDQ  D:  03/25/2010  T:  03/26/2010  Job:  [id number]

## 2011-04-28 NOTE — Assessment & Plan Note (Signed)
OFFICE VISIT   [name], [name] L  DOB:  September 20, 1926                                       07/03/2010  AOZHY#:[id number]   I saw the patient in the office today for continued followup of her  peripheral vascular disease.  She underwent a left above-the-knee  amputation on December 13, 2009, and has done well from that standpoint.  She has not really been ambulatory, however.  She has had no phantom  pain or pain in the left stump.  On the right side, she describes some  constant pain in the right leg, but I think this is more related to her  neuropathy.  The pain is constant with no real aggravating or  alleviating factors.  She has a fem-peroneal artery bypass graft on the  right which has been patent.  She has had no rest pain and no history of  nonhealing ulcers.   REVIEW OF SYSTEMS:  CARDIOVASCULAR:  She has had no chest pain, chest  pressure, palpitations, or arrhythmias.  PULMONARY:  She had no productive cough bronchitis, asthma, or wheezing.   PHYSICAL EXAMINATION:  This is a pleasant 84-year woman who appears her  stated age.  Blood pressure is 149/82, heart rate is 72, temperature is  98.  She has a palpable femoral pulse on the right.  I could not palpate  pedal pulses.  She did have an arterial Doppler study in our office  today which I independently interpreted and this shows monophasic  signals in the anterior tibial and posterior tibial positions with an  ABI of 1.1.   I have reassured her that bypass graft appears to be patent.  I think  her pain is related to her neuropathy.  I plan on seeing her back in 6  months with a graft duplex.  She knows to call sooner if she has  problems.     Di Kindle. Edilia Bo, M.D.  Electronically Signed   CSD/MEDQ  D:  07/03/2010  T:  07/04/2010  Job:  [id number]

## 2011-04-28 NOTE — Assessment & Plan Note (Signed)
NAMEERNEST, ORR                    CHART#:  [id number]   DATE:  05/02/2007                       DOB:  May 29, 1926   REFERRING PHYSICIAN:  Angus G. Renard Matter, M.D.   PROBLEM LIST:  1. Submucosal lesion in the hepatic flexure.  2. Adenomatous polyps.  3. Diabetes.  4. Helicobacter pylori infection in January of 2008.  5. History of breast cancer.  6. Coronary artery disease.  7. Right femoral popliteal bypass.  8. Hypertension.  9. Glaucoma.  10.Multiple urinary tract infections.  11.Left third digit osteomyelitis, status post amputation.  12.Tonsillectomy.  13.Gallstones.  14.Peripheral vascular disease.  15.History of a lipoma in the transverse colon.   SUBJECTIVE:  Mrs. [name] is a 77 year old female who presents as a return  patient visit.  She had a colonoscopy on April 06, 2007 to see if jumbo  biopsies could reveal the etiology of this submucosal lesion.  The jumbo  biopsies only revealed superficial benign colonic mucosa.  She again had  two tubular adenomas identified.  She presents today again complaining  of constipation.  She had stopped taking her Benefiber.  She  occasionally uses her high fiber diet handout.  She complains of  Aggrenox making her feel paranoid.  She also complains of a lesion on  her right foot popping up.  She also complains of feeling dizzy if she  lays on her left side.   MEDICATIONS:  1. Lasix.  2. Enalapril.  3. Allopurinol.  4. Pilocarpine.  5. Humalog sliding scale.  6. Lantus 75 units once daily.  7. Cosopt.  8. Potassium.  9. Travatan.  10.Meclizine four times a day.  11.Lipitor.   OBJECTIVE:  VITAL SIGNS:  Weight 227 pounds (up 1 pound since March of  2008), height 5 feet 5 inches.  Temperature 98, blood pressure 112/80,  pulse 63.GENERAL:  She is in no apparent distress.  Alert and oriented  x4.LUNGS:  Clear to auscultation bilaterally.  CARDIOVASCULAR:  Regular rate and rhythm.  No murmur.  Normal S1 and S2.  ABDOMEN:  Bowel  sounds are present.  Soft, nontender, nondistended.  No  rebound, no guarding.EXTREMITIES:  Right foot with no erythema but a few  excoriations between the fourth and fifth toe.   ASSESSMENT:  Mrs. [name] is a 77 year old female with a submucosal lesion  in her hepatic flexure.  The differential diagnosis includes leiomyoma  and a low likelihood of carcinoid.  The lesion has been looked at via CT  scan and is unable to be visualized.  She has no lymphadenopathy on CT  scan.  She also has a new lesion on her right foot. Thank you for  allowing me to see this patient in consultation.  My recommendations  follow.   RECOMMENDATIONS:  1. I spent 30 minutes talking to the patient in regards to the      management of her submucosal lesion in her hepatic flexure of her      colon.  The only definitive way to know the etiology of the lesion      in her right colon is to have it surgically removed.  She is not      enthusiastic about having surgery.  The one surgical service that      could perform the surgery via laparoscopic means  is in [place],      Dr. Ovidio Kin.  The other option includes serial CT scans to      document growth or new lymphadenopathy.  The decision was made      during this discussion to have a followup CT scan in one year.  2. She will restart her Benefiber and follow her high fiber diet to      prevent constipation.  3. I recommended that she see her podiatrist sooner than May 07, 2007      for the lesion on her right foot.  4. She has a follow up appointment to see me in six months.  I will      schedule her CT scan at that time.       Kassie Mends, M.D.  Electronically Signed     SM/MEDQ  D:  05/02/2007  T:  05/02/2007  Job:  [id number]   cc:   Angus G. Renard Matter, MD

## 2011-04-28 NOTE — Assessment & Plan Note (Signed)
OFFICE VISIT   [name], [name]  DOB:  Oct 06, 1926                                       11/22/2007  QIHKV#:[id number]   I saw the patient in the office today for continued followup of her  peripheral vascular disease.  This is a pleasant 77 year old woman who  has undergone bilateral femoral tibial bypass grafts.  She comes in for  a routine followup visit.  Her activity is fairly limited and she is  ambulatory with a walker.  She has chronic back pain and chronic leg  pain I believe related to her lumbar disk disease.  I did not get any  clear-cut history of claudication, rest pain, or non-healing ulcers.  She has complained of some slight discoloration in her left foot, which  is likely related to her venous insufficiency.   SOCIAL HISTORY:  She is not a smoker.   REVIEW OF SYSTEMS:  CARDIAC:  She has had no chest pain, chest pressure,  palpitations, or arrhythmias.  She has had no bronchitis, asthma, or wheezing.   PHYSICAL EXAMINATION:  This is a pleasant 77 year old woman who appears  her stated age.  Blood pressure is 172/79, heart rate is 83.  I did not  detect any carotid bruits.  Lungs are clear bilaterally to auscultation.  On cardiac exam, she has a regular rate and rhythm.  Abdomen is soft and  nontender.  She has palpable femoral pulses and palpable graft pulses  bilaterally.  She has a weakly palpable dorsalis pedis pulse on the  right foot.  I could not palpate pedal pulses on the left side.  She  does have a biphasic posterior tibial and dorsalis pedis signal in the  right foot and the left foot.  Feet are warm and adequately perfused  without ischemic ulcers.   Doppler study in our office today shows an ABI of 100% on the right and  90% on the left.   I reassured her that she has excellent flow to both feet.  I do not  think that her leg pain is related to her peripheral vascular disease.  Again, I think this is related to her lumbar disk  disease.  I plan on  seeing her back in 1 year.  She knows to call sooner if she has  problems.   Di Kindle. Edilia Bo, M.D.  Electronically Signed   CSD/MEDQ  D:  11/22/2007  T:  11/23/2007  Job:  [id number]

## 2011-04-28 NOTE — Assessment & Plan Note (Signed)
OFFICE VISIT   [name], [name] L  DOB:  [birth year]                                       12/24/2010  CHENI#:[id number]   I saw [name] in the office today for continued follow-up of her  peripheral vascular disease.  I last saw her in July 2011.  She had  undergone a left above-the-knee amputation on December 13, 2009 when she  presented with sepsis related to gangrene of the left foot.  She was  actually quite ill at that time and was on the ventilator for a while.  She has done well since then an aunt.  She does not have a prosthesis  but gets around fairly well in her wheelchair.  She has had some  occasional pain in the right foot, however, this has not changed in  character.  I do not get any history to suggest rest pain.  There are  really no aggravating or alleviating factors associated with this pain.  She has had no history of nonhealing ulcers in the right foot.  She has  had a rash just above the lateral malleolus on the right.  She has had  no phantom pain in her left AKA.   SOCIAL HISTORY:  She is widowed.  She has 3 children.  She does not use  tobacco.   REVIEW OF SYSTEMS:  CARDIOVASCULAR:  She has had no chest pain, chest  pressure, palpitations or arrhythmias.  She has had no history of stroke  or TIAs.  She had no history of DVT.  PULMONARY:  She has had no productive cough bronchitis, asthma or  wheezing.   PHYSICAL EXAMINATION:  This is a pleasant 77 year old woman who appears  her stated age.  Blood pressure is 138/74, heart rate is 79, saturation 96%.  LUNGS:  Clear bilaterally to auscultation without rales, rhonchi or  wheezing.  CARDIOVASCULAR:  I do not detect any carotid bruits.  She has a regular  rate and rhythm.  She has a palpable femoral pulse on the right.  I  cannot palpate distal pulses.  The right foot appears warm well-perfused.  Her left above knee  amputation site has healed nicely.  ABDOMEN:  Soft and nontender with  normal pitched bowel sounds.  NEUROLOGIC:  She has no focal weakness or paresthesias.   I did independently interpret her arterial Doppler study which shows an  ABI of 100% on the right.  This may be falsely elevated because of her  calcific disease.   I have also independently interpreted her duplex scan which shows that  her graft in the right leg which is a fem peroneal bypass graft is  patent.  She has elevated velocities at the distal anastomosis which has  been stable.   I have reassured that her graft is functioning well.  I have encouraged  her to try to stay as active as possible although certainly her activity  is limited now with her amputation.  I have ordered follow-up studies in  1 year and I will plan on seeing her back in 2 years unless there has  been a significant change in her Doppler studies.     Di Kindle. Edilia Bo, M.D.  Electronically Signed   CSD/MEDQ  D:  12/24/2010  T:  12/25/2010  Job:  [id number]

## 2011-04-28 NOTE — Procedures (Signed)
BYPASS GRAFT EVALUATION   INDICATION:  Followup evaluation of lower extremity bypass graft.   HISTORY:  Diabetes:  Yes.  Cardiac:  MI.  Hypertension:  Yes.  Smoking:  No.  Previous Surgery:  Revision of left femoral to posterior tibial artery  bypass graft on 04/14/2002 by Dr. Edilia Bo, right femoral to peroneal  artery bypass graft on 10/07/1995 by Dr. Hart Rochester.   SINGLE LEVEL ARTERIAL EXAM                               RIGHT              LEFT  Brachial:                    178                Mastectomy  Anterior tibial:             177                60  Posterior tibial:            162                54  Peroneal:  Ankle/brachial index:        0.99               0.34   PREVIOUS ABI:  Date:  03/12/2009  RIGHT:  1.05  LEFT:  0.82   LOWER EXTREMITY BYPASS GRAFT DUPLEX EXAM:   DUPLEX:  Increased velocity of 871 cm/s noted at the left femoral to  posterior tibial artery bypass graft distal anastomosis.  Biphasic duplex waveform noted within graft in native artery.   IMPRESSION:  1. Normal right lower extremity ABI with biphasic Doppler waveforms.  2. Left lower extremity ABI suggests severe arterial disease.  3. Left bypass graft distal anastomosis velocity suggests >75%      stenosis.   ___________________________________________  Di Kindle. Edilia Bo, M.D.   AC/MEDQ  D:  07/09/2009  T:  07/10/2009  Job:  [id number]

## 2011-04-28 NOTE — Assessment & Plan Note (Signed)
[name], [name]                    CHART#:  [id number]   DATE:  11/24/2007                       DOB:  [birth year]   REFERRING PHYSICIAN:  Angus G. Renard Matter, M.D.   PROBLEM LIST:  1. Submucosal lesion in the hepatic flexure.  2. Adenomatous polyps.  3. Diabetes.  4. Helicobacter pylori infection in January 2008, treatment complete.  5. History of breast cancer.  6. Coronary artery disease.  7. Right femoral popliteal bypass.  8. Hypertension.  9. Glaucoma.  10.Multiple urinary tract infections.  11.Tonsillectomy.  12.Gallstones.  13.Peripheral vascular disease.  14.History of lipoma in the transverse colon.   SUBJECTIVE:  [name] is an 77 year old female who presents as a return  patient visit.  She has multiple complaints, but none of them appear to  be bothering her significantly.  She had diarrhea after eating a bowl of  cereal with 2% milk.  Her bowels have been pretty fair.  She complains  of abdominal pain and bloating.  Her symptoms are intermittent.  She is  having dizzy spells.  She had MRSA in a boil on her arm and a bladder  infection in September of 2008.  Sometimes when she has a bowel  movement, they come out as little balls.  Her abdominal pain sometimes  occurs right before she has a bowel movement, and then after she has a  bowel movement, it goes away.   OBJECTIVE:  VITAL SIGNS:  Weight 230 pounds (up 3 pounds since May of  2008), height 5 feet 5 inches.  Temperature 98.5, blood pressure 130/60,  pulse 80.  GENERAL:  She is in no apparent distress.  Alert and oriented  x4. LUNGS:  Clear to auscultation bilaterally.  CARDIOVASCULAR:  Regular  rate and rhythm, no murmur.ABDOMEN:  Bowel sounds are present.  Soft,  nontender, nondistended.  No rebound or guarding.  She is examined  sitting because she cannot get on the exam table.   ASSESSMENT:  [name] is a 77 year old female with nonspecific  gastrointestinal complaints.  She has a history of a submucosal  lesion  in her hepatic flexure which she elected to follow with imaging.   Thank you for allowing me to see [name] in consultation.  My  recommendations follow.   RECOMMENDATIONS:  1. Discontinue adding fiber to her diet.  2. Will schedule CT scan of the abdomen and pelvis with oral and IV      contrast in March 2008.  3. Follow up in 6 months.       Kassie Mends, M.D.  Electronically Signed     SM/MEDQ  D:  11/24/2007  T:  11/25/2007  Job:  [id number]   cc:   Angus G. Renard Matter, MD

## 2011-04-28 NOTE — Procedures (Signed)
BYPASS GRAFT EVALUATION   INDICATION:  Followup of bilateral lower extremity bypass graft.   HISTORY:  Diabetes:  Yes.  Cardiac:  MI.  Hypertension:  Yes.  Smoking:  No.  Previous Surgery:  Revision of left femoral to posterior tibial artery  bypass graft on 04/14/2002 by Dr. Edilia Bo, right femoral to peroneal  artery bypass graft on 10/07/1995 by Dr. Hart Rochester.   SINGLE LEVEL ARTERIAL EXAM                               RIGHT              LEFT  Brachial:                    195                Mastectomy  Anterior tibial:             205                113  Posterior tibial:            204                159  Peroneal:  Ankle/brachial index:        1.05               0.82   PREVIOUS ABI:  Date:  11/27/2008  RIGHT:  1.04  LEFT:  1.01   LOWER EXTREMITY BYPASS GRAFT DUPLEX EXAM:   DUPLEX:  Patent right femoral to peroneal artery bypass graft with  increased velocity of 201 cm/second at the distal anastomosis.  Patent left femoral to posterior tibial artery bypass graft with  significant increase in known distal anastomosis velocity from 548  cm/second to 876 cm/second.   IMPRESSION:  1. Normal right ABIs with monophasic Doppler waveforms.  2. Significant drop in left lower extremity ABI from 1.01 to 0.82.   ___________________________________________  Di Kindle. Edilia Bo, M.D.   AC/MEDQ  D:  03/12/2009  T:  03/13/2009  Job:  [id number]

## 2011-04-28 NOTE — Assessment & Plan Note (Signed)
OFFICE VISIT   [name], [name] L  DOB:  03/01/1926                                       07/09/2009  AOZHY#:[id number]   I saw the patient in the office today for continued followup of her  peripheral vascular disease.  She has a functioning left femoral to  posterior tibial artery bypass graft and when I last saw her in March of  this year she had some cracks on the plantar aspect of her left foot  with some mild swelling.  ABI was 80% at that time and I thought she had  adequate circulation to heal these wounds but I brought her back in for  a 4 month followup visit.  Of note, she has an area of increased  velocity at the distal anastomosis in the distal posterior tibial artery  but this has been stable for some time.  We have previously discussed  possibly revising this area but she was opposed to any further surgery.   Her main complaint is burning pain and numbness in her left foot which  is constant and most likely related to her neuropathy.  I do not get any  history of rest pain or history of nonhealing ulcers.   On review of systems she has had no recent chest pain, chest pressure,  palpitations or arrhythmias.  She has had no productive cough,  bronchitis, asthma or wheezing.   On physical examination this is a pleasant 77 year old woman who appears  her stated age.  Her blood pressure is 172/101, heart rate is 71.  She  has a regular rate and rhythm.  She has palpable femoral pulses and  warm, well-perfused feet without ischemic ulcers.   Doppler study in our office today shows that her bypass graft on the  left is patent with biphasic Doppler signals in the posterior tibial and  dorsalis pedis position.  She has biphasic Doppler signals on the right  also.   Her wounds have all healed.  Her graft is patent. She has continued  neuropathy.  I plan on seeing her back in 1 year.   Di Kindle. Edilia Bo, M.D.  Electronically Signed   CSD/MEDQ   D:  07/09/2009  T:  07/10/2009  Job:  [id number]   cc:   Denny Peon. Ulice Brilliant, D.P.M.

## 2011-04-28 NOTE — Assessment & Plan Note (Signed)
[facility]                            CARDIOLOGY OFFICE NOTE   NAME:[name]                          MRN:          [id number]  DATE:05/08/2008                            DOB:          1926/05/09    HISTORY OF PRESENT ILLNESS:  [name] comes in today for further  management of the following issues.  1. Nonobstructive coronary artery disease.  She is having no angina,      but is very immobile.  2. Normal left ventricular systolic function.  3. History of middle cerebral artery occlusion.  4. Nonobstructive carotid disease by recent Dopplers December 2008.  5. Severe peripheral vascular disease status post multiple      interventions and bilateral lower extremity surgeries.  6. Edema.   She has multiple complaints today.  Her biggest complaint is she cannot  lose any weight.  She also has this dry cough which I think is  enalapril.  She says she would like to continue to take it.  She  despises taking Lasix, and also she can hold it on the day she goes out  or goes to church.  I told her this would be fine as long as she took it  early in the afternoon.   She denies orthopnea.  Peripheral edema is really a problem for her.   MEDICATIONS:  The same as last visit.  She is no longer on Metanx  or  nitrofurantoin.  She has also stopped her atorvastatin.   PHYSICAL EXAMINATION:  VITAL SIGNS:  Stable, heart rate 72 and regular,  weight 233 up 2.  HEENT:  Unchanged.  Carotid upstrokes are equal bilaterally with soft  systolic sounds.  Thyroid is not enlarged.  Trachea is midline.  LUNGS:  Clear.  HEART:  Reveals a poorly appreciated PMI.  She has normal S1-S2.  ABDOMEN:  Obese.  Organomegaly could not be assessed.  EXTREMITIES:  Reveal 2+ pitting edema.  Pulses are present but reduced.  NEUROLOGICAL:  Exam is grossly intact.  She is sitting in a wheelchair,  however.   ASSESSMENT:  I had a nice chat with [name] and her daughter today.  We  renewed her furosemide and reinforced that she can take it after church  or after going out in the morning as long as she does not miss her dose.  She will continue with her other medications.  Will plan on seeing her  back again in 6 months.     Thomas C. Daleen Squibb, MD, [facility]  Electronically Signed   TCW/MedQ  DD: 05/08/2008  DT: 05/08/2008  Job #: [id number]   cc:   [name], M.D.

## 2011-04-28 NOTE — Assessment & Plan Note (Signed)
NAMESHIKARA, MCAULIFFE                    CHART#:  [id number]   DATE:  06/05/2008                       DOB:  [birth year]   REFERRING Darthula Desa:  Ishmael Holter. McInnis, MD.   PROBLEM LIST:  1. Submucosal lesion and hepatic flexure.  2. History of adenomatous polyps with last colonoscopy in April 2008,      and a 6-mm tubular adenoma removed in January 2008.  3. H. pylori gastritis in January 2008.  4. Diabetes.  5. History of breast cancer.  6. Coronary artery disease.  7. Right fem-pop bypass.  8. Hypertension.  9. Glaucoma.  10.Multiple urinary tract infections.  11.Gallstones.  12.Peripheral vascular disease.   SUBJECTIVE:  Ms. Frate is an 77 year old female who presents as a return  patient visit.  She complained of stomach hurting after she eats.  So,  she do not have an appetite.  She has been eating fruits and Saponaria  for regular bowel movements.  She is no longer taking her proton pump  inhibitor.  Her discomfort is located in her upper abdomen.   MEDICATIONS:  1. Lasix.  2. Enalapril.  3. Allopurinol.  4. Pilocarpine.  5. Humalog sliding scale.  6. Lantus 40 units b.i.d.  7. Cosopt.  8. Travoprost.  9. Nitrofurantoin 50 mg at bedtime.  10.Vitamin daily.  11.Pain pills for right shoulder pain.   PHYSICAL EXAMINATION:  VITAL SIGNS:  Weight 231 pounds, height 5 feet 5  inches, temperature 98.6, blood pressure 150/84, and pulse 80.  GENERAL:  She is in no apparent distress, alert and oriented x4.LUNGS:  Clear to auscultation bilaterally.CARDIOVASCULAR:  Regular rhythm.  No  murmur.ABDOMEN:  Bowel sounds are present, soft, nontender,  nondistended, and obese.   ASSESSMENT:  Ms. Daniele is an 77 year old female who complains of some  postprandial abdominal pain, which appears to be mild.  The differential  diagnosis include gastritis or uncontrolled abdominal pain.  She has no  alarm symptoms.  Thank you for allowing me to see Ms. Doggett in  consultation.  My recommendation is as  follows.   RECOMMENDATIONS:  1. She should restart her omeprazole.  2. She may try Imodium 1 thirty minutes before breakfast and supper to      try to help with the abdominal pain after eating.  She is not a      candidate for anticholinergics due to her ongoing problem with      glaucoma.  She was given her discharge instructions in writing.  3. We will repeat a CT scan in 2010 or 2011 for her submucosal lesion,      which is most likely a lipoma.  4. She has a follow up appointment to see me in 6 months.  5. I have asked her to take Benefiber every morning and follow a high-      fiber diet.       Kassie Mends, M.D.  Electronically Signed     SM/MEDQ  D:  06/05/2008  T:  06/06/2008  Job:  [id number]   cc:   Angus G. Renard Matter, MD

## 2011-04-28 NOTE — H&P (Signed)
NAMEDENINA, RIEGER                   ACCOUNT NO.:  [id number]   MEDICAL RECORD NO.:  [id number]          PATIENT TYPE:  AMB   LOCATION:  DAY                           FACILITY:  [facility]   PHYSICIAN:  Dennie Maizes, M.D.   DATE OF BIRTH:  Apr 25, 1926   DATE OF ADMISSION:  DATE OF DISCHARGE:  [facility]                              HISTORY & PHYSICAL   CHIEF COMPLAINT:  Recurrent urinary tract infections, air in the  bladder.   HISTORY OF PRESENT ILLNESS:  This 77 year old female was referred to me  by Dr. Renard Matter.  She has history of recurrent urinary tract infections.  She has undergone cystoscopy and multiple urethral dilations in the  past.  She complains of increased urinary frequency, urinary urgency  incontinence, as well as dysuria.  Urine is foul-smelling.  She has  history of passing gas in the urine.  She has to urinate every 30  minutes during the day and night.  She has been treated with multiple  courses of antibiotics for recurrent urinary tract infections.  She  denied having any fever, chills or hematuria at present.   The patient has been evaluated with a renal ultrasound which revealed no  evidence of hydronephrosis or calculi.  She has also undergone CT scan  of abdomen and pelvis which revealed a large amount of gas in the  bladder.  She is brought to the short-stay center today for cystoscopy  due to evaluate for colovesical fistula with possible urethral dilation.   PAST MEDICAL HISTORY:  1. Diabetes mellitus.  2. Peripheral vascular disease.  3. Hypertension.  4. Status post cystoscopy and multiple urethral dilations.  5. History of coronary artery disease, status post MI.   MEDICATIONS:  Insulin, Glucophage, Vasotec, hydrochlorothiazide,  Prilosec.   ALLERGIES:  To PENICILLIN, SULFA, CODEINE AND ASPIRIN.   PHYSICAL EXAMINATION:  HEAD, EYES, EARS, NOSE AND THROAT: Normal.  LUNGS:  Clear to auscultation.  HEART:  Regular rate and rhythm.  No murmurs.  ABDOMEN:   Soft.  No palpable flank mass or CVA tenderness.  Bladder is  not palpable.   IMPRESSION:  Recurrent urinary tract infection, voiding difficulty,  possible urethral stenosis, and possible colovesical fistula.   PLAN:  Cystoscopy, possible urethral dilation under anesthesia in short-  stay center.  I have discussed the patient and her family regarding the  diagnosis, operative details, alternative treatments, outcome possible  risks and complications and the patient has agreed for the procedure to  be done.      Dennie Maizes, M.D.  Electronically Signed     SK/MEDQ  D:  02/25/2009  T:  02/25/2009  Job:  [id number]   cc:   Angus G. Renard Matter, MD  Fax: [phone]   Kassie Mends, M.D.  [address]  [place] , [place] [address]

## 2011-04-28 NOTE — Assessment & Plan Note (Signed)
OFFICE VISIT   [name], [name] L  DOB:  24-Jun-1926                                       10/24/2009  ZOXWR#:[id number]   I saw the patient in the office today for continued follow-up of her  nonhealing wound of the left first metatarsal.  I had seen her on  October 03, 2009, with a dry wound over the first metatarsal.  She has  been followed by Dr. Ulice Brilliant and also at the wound care center by Dr.  Tanda Rockers.  She has bilateral lower extremity bypass grafts and I was  hoping I could assess her arterial circulation with a CT angio and avoid  arteriography, which might compromise her grafts.  She had a CT angio  done and also an x-ray of her foot.  The x-ray of the foot has not yet  been read by radiologist, but by my interpretation it may show evidence  of osteomyelitis involving the medial aspect of her left first  metatarsal head.  The CT angiogram shows a patent femoral to peroneal  artery bypass graft on the right side.  On the left side, the femoral  posterior tibial artery bypass graft appears to be patent proximally but  is poorly-visualized distally.  There is collateral reconstitution of  the distal posterior tibial artery, but the patient appears to have  severe distal tibial disease.  Based on the CT angiogram, I do not  really see any options for revascularization on the left.   The patient continues to have some pain associated with the wound on her  left great toe.   On review of systems, she has had no fever or chills.  She does admit to  some dyspnea on exertion.  She has had no chest pain.   On physical examination, this is a pleasant 77 year old woman who her  appears stated age.  Blood pressure is 161/77, heart rate is 89,  respiratory rate heart rate 16.  She has palpable femoral pulses  bilaterally and a palpable left brachial pulse.  I cannot palpate pedal  pulses on either side.  The wound on the left great toe looks slightly  larger and is  fairly deep.  I think it most likely involves the  metatarsal head.   I do not think based on the CT angiogram that I see any obvious options  for revascularization as she may have simply progression of her distal  tibial disease.  I have explained that I think that the wound will  likely gradually progress, especially as it looks like there is  underlying osteomyelitis.  If we were to proceed with amputation of the  left great toe, I am concerned that she does not have adequate  circulation to heal this and will simply be left with a wound further  back on the foot.  I think the only option to see if there are any  potential options for revascularization would be to proceed with  arteriography despite its risk.  I have discussed attempting a brachial  arteriogram to allow [place] to intervene if we find any distal disease  amenable to angioplasty.  She understands there is slightly higher risk  associated with this but would like to consider this with her family and  if she is agreeable to proceed, will let us know.  Otherwise, if the  wound continues to progress, unfortunately, I think she will require a  primary below-the-knee or above-the-knee amputation.   Di Kindle. Edilia Bo, M.D.  Electronically Signed   CSD/MEDQ  D:  10/24/2009  T:  10/25/2009  Job:  [id number]   cc:   Angus G. Renard Matter, MD  Theresia Majors Tanda Rockers, M.D.  Denny Peon. Ulice Brilliant, D.P.M.

## 2011-04-28 NOTE — Assessment & Plan Note (Signed)
OFFICE VISIT   [name], [name] L  DOB:  [birth year]                                       11/27/2008  ZOXWR#:[id number]   She had a right superficial femoral artery to peroneal artery bypass  using a saphenous vein graft in 1996.  In April 2000 she had a left  femoral to distal posterior tibial artery bypass graft with a vein  graft.  She had replacement of a section of her graft in the left leg  with an interposition saphenous vein graft in the right leg and vein  patch angioplasty in 2000.  In May 2003 she had a vein patch angioplasty  of a vein graft stenosis of her left fem posterior tibial bypass graft  using left lesser saphenous vein.   Today she complains of generalized arthritis involving her neck, back,  hips and also gout in her feet.  Her activity is fairly limited.  I did  not really get any history of claudication or rest pain.   REVIEW OF SYSTEMS:  She has had no recent chest pain, chest pressure,  palpitations or arrhythmias.  She has had no bronchitis, asthma or  wheezing.   PHYSICAL EXAMINATION:  This is a pleasant 82-yea-old woman who appears  her stated age.  Blood pressure 177/93, heart rate is 81.  Lungs are  clear bilaterally to auscultation.  Cardiac exam, she has a regular rate  and rhythm.  She has palpable femoral pulses and a palpable left graft  pulse.  Both feet appear adequately perfused.   Her duplex scan today shows an ABI of 100% bilaterally.  These may be  falsely elevated because of calcific disease.  Both bypass grafts are  patent.  There is an area of increased velocities in the left fem  posterior tibial bypass graft that has actually decreased compared to  the previous study.  However, at the distal anastomosis, there is an  area of increased velocity.  We have discussed the option of evaluating  the distal stenosis.  However, she is very reluctant to consider any  further surgery.  She likely has progression of her  distal disease and  she has limited vein given the multiple procedures she has had in the  past.  For this reason, we will restudy her graft in 4 months.  I have  explained certainly there is the risk that the graft could thrombosis.  However, again, she is very reluctant to consider any further surgery.  I will see her back in 4 months.  She knows to call sooner if she has  problems.   Di Kindle. [name], M.D.  Electronically Signed   CSD/MEDQ  D:  11/27/2008  T:  11/29/2008  Job:  [id number]

## 2011-04-28 NOTE — Assessment & Plan Note (Signed)
OFFICE VISIT   [name], [name] L  DOB:  04-Apr-1926                                       11/27/2009  ZOXWR#:[id number]   The patient presented to the office today complaining of increasing  swelling in her left lower extremity with some erythema of her foot.  She also has a chronic nonhealing ulcer over her left first metatarsal  head.   She recently underwent an arteriogram by Dr. Edilia Bo and was found to  have unreconstructable arterial occlusive disease.   The patient since she has some occasional pain in the left foot but it  does not bother her much.  She was mainly concerned about the appearance  and the swelling.   PHYSICAL EXAM:  Today blood pressure is 160/75, heart rate 76 and  regular, respirations 16.  Left lower extremity:  The foot is  erythematous up to the level of the ankle.  There is 1+ edema extending  from the foot all the way to the level of the knee.  There is an open  ulceration over the first metatarsal head but this does not have any  purulent drainage from it.  There are no other obvious openings in the  left foot.   The patient had a venous duplex exam today which showed no evidence of  DVT in the left leg.  I had a lengthy conversation with the patient  today as well as her daughter who was here for the office visit.  I  believe at this point really the only option for her is going to be an  amputation of her left leg.  This may ultimately require an above knee  amputation.  I spoke with her today regarding this.  However, she does  not want an amputation at this point.  She has some concerns she might  not be able to survive that type of operation.  She will think about  this for a few days.  We have scheduled her for close followup with Dr.  Edilia Bo in the near future.     Janetta Hora. Fields, MD  Electronically Signed   CEF/MEDQ  D:  11/28/2009  T:  11/28/2009  Job:  [id number]

## 2011-04-28 NOTE — Assessment & Plan Note (Signed)
[facility]                            CARDIOLOGY OFFICE NOTE   NAME:[name], [name]                          MRN:          [id number]  DATE:10/24/2007                            DOB:          [birth year]    [name] comes in today for further management of the following issues:  1. Nonobstructive coronary disease.  2. Normal left ventricular systolic function.  3. History of middle cerebral artery occlusion.  4. Severe peripheral vascular disease, status post multiple      interventions and bilateral lower extremity surgeries.   She continues to have multiple complaints.  Her biggest complaint is  having to take all this medicine.  She does not know why her weight  keeps going up and why she is so swollen in her legs.  She would like to  stop her furosemide!  She has stopped her potassium, having run out of  it and not getting it refilled.   She has a dry cough which I told her may be enalapril.  There is not a  generic equivalent unfortunately.   CURRENT MEDICATIONS:  Unchanged since her last visit.  She just got over  some methicillin resistant Staph of her left arm and she has finished  about a 3-week course of antibiotics.   PHYSICAL EXAMINATION:  Her blood pressure is 162/68, pulse is 77 and  regular, her weight is 231, up seven.  She is sitting in a wheelchair.  HEENT:  Unchanged.  NECK:  Carotids are full.  There are no bruits.  Thyroid is not  enlarged.  Trachea is midline.  LUNGS:  Clear.  HEART:  Reveals a poorly appreciated PMI.  She has normal S1/S2 without  gallop.  ABDOMEN:  Obese with good bowel sounds.  Organomegaly could not be  adequately assessed.  EXTREMITIES:  Reveal only 1+ edema.  She has got a 2+/4+ dorsalis pedis  pulse on the right, 1+ on the left.  There is no sign of DVT.  NEUROLOGIC:  Grossly intact.   [name] is stable from a cardiovascular and peripheral vascular  standpoint.  I have made no changes in her  program.  I have made her  aware of the enalapril cough and have also made her aware she needs to  eat potassium rich foods.  We will plan on seeing her back in six  months.    Thomas C. Daleen Squibb, MD, [facility]  Electronically Signed   TCW/MedQ  DD: 10/24/2007  DT: 10/25/2007  Job #: [id number]   cc:   [name], M.D.

## 2011-04-28 NOTE — H&P (Signed)
[name], [name]                   ACCOUNT NO.:  [id number]   MEDICAL RECORD NO.:  [id number]          PATIENT TYPE:  INP   LOCATION:  IC10                          FACILITY:  [facility]   PHYSICIAN:  Melvyn Novas, MDDATE OF BIRTH:  [birth year]   DATE OF ADMISSION:  12/10/2007  DATE OF DISCHARGE:  [facility]                              HISTORY & PHYSICAL   The patient is a 77 year old black female with a history of documented  cerebrovascular disease by MRA in April of 2008.  Was sitting at a table  with family members, had two syncopal episodes where she slumped over,  did not have any reported seizure activity.  She specifically denied any  antecedent palpitations or anginal type chest pain.  She was a little  confused after these episodes and was brought to the ER.  A CAT scan  revealed no significant abnormalities, no bleed, no evidence of acute  infarct and just cerebral atrophy with some white matter changes.  She  is admitted to rule out dysrhythmia due to metabolic abnormalities,  ischemia, rule out carotid artery disease or valvular stenosis and  possible hypoglycemic episodes.   PAST MEDICAL HISTORY:  Significant for:  1. Hypertension.  2. Insulin-dependent diabetes.  3. Hyperlipidemia.  4. Gout.   PAST SURGICAL HISTORY:  Remarkable for polypectomy, otherwise  unremarkable.   CURRENT MEDICINES:  1. Vasotec 20 a day.  2. Lasix 20 a day.  3. Pilocarpine drops b.i.d.  4. Allopurinol 100 mg per day.  5. Aspirin 81 mg per day.  6. Lantus 40 units subcu b.i.d. with NovoLog sliding scale.   Blood pressure at present is 132/82, pulse is 80 and regular with a  first degree AV block.  She is afebrile, respiratory rate is 18.  EYES:  PERRLA, extraocular movements intact, sclera clear, conjunctiva  pink.  NECK:  No JVD, no carotid bruits, no thyromegaly or thyroid bruits.  LUNGS:  Clear to A&P.  No rales, wheezing or rhonchi appreciable.  HEART:  Regular rhythm.  No murmurs,  gallops, heaves, thrills or rubs.  ABDOMEN:  Obese, soft, nontender.  Bowel sounds normoactive.  No  guarding, no rebound, no mass, no megaly.  EXTREMITIES:  No clubbing, cyanosis or edema.  Homan's sign negative.  No evidence of DVT, no palpable cord.   IMPRESSION IS AS FOLLOWS:  1. Syncopal episode times two.  2. Status post cerebral artery disease with total occlusion of right      middle cerebral artery documented by MRA.  3. Carotid artery disease per MRA of 2008.  4. Hypertension.  5. Hyperlipidemia.  6. Insulin-dependent diabetes.   PLAN:  1. Decrease her Lantus to 25 units before meals b.i.d.  2. Obtain 2D echocardiogram to assess valvular status.  3. Carotid ultrasound repeat on Monday.  4. Empirically place on aspirin.  5. Neurochecks.  6. Will make further recommendations as the database expands, in      addition to sliding scale insulin coverage.      Melvyn Novas, MD  Electronically Signed     RMD/MEDQ  D:  12/11/2007  T:  12/11/2007  Job:  [id number]

## 2011-04-28 NOTE — Assessment & Plan Note (Signed)
OFFICE VISIT   [name], [name] L  DOB:  23-Dec-1925                                       02/14/2010  LKGMW#:[id number]   Date of most recent vascular procedure was 12/13/2009 when she underwent  a left above knee amputation by Dr. Waverly Ferrari.   The patient is an 77 year old black female who has a history of severe  peripheral vascular disease.  She has undergone bilateral femoral-tibial  bypass grafting.  She had a right SFA to peroneal bypass with vein by  Dr. Josephina Gip in October of 1996 and later had a left femoral to  posterior tibial bypass in 2000 by Dr. Waverly Ferrari.  This graft  required thrombectomy in 2000 and a revision in 2003.  Ultimately this  graft occluded and there were no revascularization options.  She  subsequently developed multiple wounds in her left foot that would not  heal and she required a left above knee amputation by Dr. Waverly Ferrari on 12/13/2009.  Since her amputation the site has healed and the  staples are out.  She continues to receive home health physical therapy  and has been seen at [facility] but unfortunately was told that  she is not strong enough for prosthetic therapy at this point.  Currently she does have a manual wheelchair but is not strong enough to  maneuver this herself.  She does have assistance at home from her  children but requires one to two person assist with transfers.  She is  here today primarily to discuss obtaining a motorized wheelchair to  improve her mobility as currently she is totally reliant on other  people's assistance.  She has not had any recent falls.  Does report a  fair amount of chronic back pain.  She reports she has had an MRI in the  past which showed degenerative disk disease.  She does not take pain  medication as she went into cardiopulmonary arrest during one of her  hospitalizations after receiving narcotic pain medication.  Prior to  amputation  she said her mobility was also limited by her shortness of  breath.  She reports that she was unable to get from one room to the  next without developing dyspnea.   PAST MEDICAL HISTORY:  Her medical history is significant for peripheral  vascular disease as discussed above, acute renal failure and acute  respiratory failure during her last admission an January of 2011, morbid  obesity, diabetes mellitus type 2, hypertension, chronic diastolic  congestive heart failure, coronary artery disease and chronic  constipation.  She has had a left mastectomy, appendectomy and hernia  repair.   SOCIAL HISTORY:  She is widowed with three children.  She does not smoke  or drink alcohol.   REVIEW OF SYSTEMS:  Is positive for weight loss.  She is unsure how  much.  She does not know her current weight but during her last  admission she was 95.7 kilograms and 65 inches in height.  She denies  chest pain but does get dyspnea on exertion.  She does have some mild  dysphagia which has overall improved after esophageal dilatation.  She  has problems with reflux.  She has urinary frequency as well as  occasional dysuria.  She has diabetic neuropathy in her feet which  causes pain with walking.  She  also reports history of a mini stroke in  the past.  She is unsure what symptoms.  She does have some arthritic  pains and particularly chronic back pain from her spinal degeneration.  She also reports decrease in her hearing and problems with psoriasis.   PHYSICAL EXAM:  Vital signs:  Findings show blood pressure of 152/54,  respirations 20, heart rate of 81.  General:  She is an alert,  cooperative, pleasant female who appears her stated age.  She is sitting  in a wheelchair.  She does appear obese.  Lungs:  Sounds are clear.  Heart:  Has a regular rate and rhythm.  Extremities:  Her right lower  extremity shows 1+ ankle edema.  She has a palpable posterior tibial  pulse on the right.  She has evidence of  left above knee amputation with  the presence of a stump shrinker.  Her grips are symmetrical but does  exhibit some generalized weakness.  Her right leg also exhibits signs of  generalized weakness.   Again, the patient is here today to discuss certification for a power  wheelchair.  I have done a face-to-face interview and physical exam with  the patient and I have completed the appropriate forms and feel that she  is qualified to have a power wheelchair.  Her mobility is greatly  limited by her peripheral vascular disease and new left above knee  amputation.  Her mobility is also limited by her chronic back pain and I  believe as well from her dyspnea on exertion which with her chronic  diastolic congestive heart failure probably is playing a role in this.  Her age and obesity are also limiting factors.  Currently she is a one  to two person assist with transfers and is completely dependent on  others when it comes to being pushed in her wheelchair.  By report the  physical therapist and [employer] staff also feel that she is  not strong enough to begin mobility with a left leg prosthesis.  In  regards to her chronic medical conditions there appears to be no acute  exacerbation at this time.  She will continue followup with her family  physician, Dr. Renard Matter.  Dr. Edilia Bo will plan on seeing her back in  approximately 5 months for continued surveillance followup for her right  lower extremity bypass.  She may call sooner if there are any new issues  that arise.   Jerold Coombe, PA   Larina Earthly, M.D.  Electronically Signed   AWZ/MEDQ  D:  02/14/2010  T:  02/14/2010  Job:  [id number]

## 2011-04-28 NOTE — Procedures (Signed)
DUPLEX DEEP VENOUS EXAM - LOWER EXTREMITY   INDICATION:  Left lower extremity swelling and pain.   HISTORY:  Edema:  Yes.  Trauma/Surgery:  Left fem to peroneal bypass graft.  Pain:  Yes.  PE:  No.  Previous DVT:  No.  Anticoagulants:  Other:   DUPLEX EXAM:                CFV   SFV   PopV  PTV    GSV                R  L  R  L  R  L  R   L  R  L  Thrombosis    o  o     o     o      o  Spontaneous   +  +     +     +      +  Phasic        +  +     +     +      +  Augmentation  +  +     +     +      +  Compressible  +  +     +     +      +  Competent     +  +     +     +      +   Legend:  + - yes  o - no  p - partial  D - decreased   IMPRESSION:  No evidence of deep venous thrombosis identified.  The  great saphenous vein has been stripped.  A quick look at the distal  anastomosis of the bypass graft in the mid calf revealed a stenosis of  323 cm/s.    _____________________________  Janetta Hora. Fields, MD   CJ/MEDQ  D:  11/27/2009  T:  11/27/2009  Job:  [id number]

## 2011-04-28 NOTE — Assessment & Plan Note (Signed)
OFFICE VISIT   [name], [name]  DOB:  1926-10-09                                       01/16/2010  ZOXWR#:[id number]   I saw the patient in the office today for followup after her left above  knee amputation which was performed on 12/13/2009.  She returns for her  first outpatient visit.  She has been at home with her family and doing  reasonably well, gradually resuming activities around the house.   PHYSICAL EXAMINATION:  On examination blood pressure is 152/84, heart  rate is 80, temperature 98.2.  Her left AKA is healing nicely and we  removed her staples today.  On the right side she has no wounds on her  foot.  Her foot appears adequately perfused.   I have written her a prescription for a left above the knee prosthesis  and also for an electric wheelchair.  I plan on seeing her back in 6  months to follow her right foot.     Di Kindle. Edilia Bo, M.D.  Electronically Signed   CSD/MEDQ  D:  01/16/2010  T:  01/17/2010  Job:  [id number]

## 2011-04-29 ENCOUNTER — Encounter (INDEPENDENT_AMBULATORY_CARE_PROVIDER_SITE_OTHER): Payer: Medicare Other

## 2011-04-29 ENCOUNTER — Ambulatory Visit (INDEPENDENT_AMBULATORY_CARE_PROVIDER_SITE_OTHER): Payer: Medicare Other | Admitting: Vascular Surgery

## 2011-04-29 DIAGNOSIS — I739 Peripheral vascular disease, unspecified: Secondary | ICD-10-CM

## 2011-04-29 DIAGNOSIS — T82898A Other specified complication of vascular prosthetic devices, implants and grafts, initial encounter: Secondary | ICD-10-CM

## 2011-04-29 DIAGNOSIS — Z48812 Encounter for surgical aftercare following surgery on the circulatory system: Secondary | ICD-10-CM

## 2011-04-30 NOTE — Assessment & Plan Note (Signed)
OFFICE VISIT  [name], [name] DOB:  02/17/1926                                       04/29/2011 [id number]#:[id number]  I saw patient in the office today for continued follow-up of her right foot cellulitis.  I last saw her on 04/08/11.  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  She has had a previous left above-the-knee amputation.  She had drop a bed rail on her right foot, and since that time had some erythema in the foot but no fever and no drainage.  She had an x-ray which showed osteoarthritis of the great and second toes but no evidence of osteomyelitis.  She had a nondisplaced fracture of the distal phalanx of the great toe.  I put her on 10 days of Keflex, which she has completed.  Patient has continued to have some redness and pain in the right foot.  REVIEW OF SYSTEMS:  She has had no fever or chills.  PHYSICAL EXAMINATION:  This is a pleasant 77 year old woman who appears her stated age.  Blood pressure 145/73, heart rate is 74, temperature is 91.1.  Her lungs are clear bilaterally to auscultation.  The foot continues to have some mild swelling and erythema.  There is no drainage.  There is a small darkened area on the great toenail bed. There is no drainage.  Her graft duplex shows that her graft is patent, although she appears to have occlusion of the distal anastomosis with PT and peroneal being fed via collaterals.  She has severe distal tibial disease and on previous arteriography had single vessel runoff via the peroneal artery.  I do not think there are any further options for revascularization, and given her age, certainly she would be at high risk for any invasive procedures.  I have written her a prescription for 2 weeks of Keflex.  If the swelling persists, we could consider an MRI, but I really do not think there is anything much different to do short of the antibiotics.  I will see her back in 4 weeks.  She knows to  call sooner if she has problems.    Di Kindle. Edilia Bo, M.D. Electronically Signed  CSD/MEDQ  D:  04/29/2011  T:  04/30/2011  Job:  [id number]

## 2011-05-01 NOTE — Group Therapy Note (Signed)
[name], [name]                   ACCOUNT NO.:  [id number]   MEDICAL RECORD NO.:  [id number]          PATIENT TYPE:  INP   LOCATION:  A213                          FACILITY:  [facility]   PHYSICIAN:  Angus G. Renard Matter, MD   DATE OF BIRTH:  02-08-1926   DATE OF PROCEDURE:  DATE OF DISCHARGE:                                   PROGRESS NOTE   OBJECTIVE:  This patient was admitted with the chief problem being near  syncopal episode for approximately one week, left sided weakness.  She is an  insulin dependent diabetic.  She has some difficulty voiding.  She has had  several episodes of hypoglycemia since admission.   OBJECTIVE:  VITAL SIGNS:  Blood pressure 131/66, respirations 20, pulse 73,  temperature 98.3, blood sugars have ranged from 153-218.  LUNGS:  Clear to P&A.  HEART:  Regular rhythm.  ABDOMEN:  No palpable organs or masses.   ASSESSMENT:  The patient was admitted with near syncope and hypoglycemia.   PLAN:  Continue monitor of blood sugars.  Would obtain carotid Doppler  ultrasound, echocardiogram.       AGM/MEDQ  D:  06/15/2005  T:  06/15/2005  Job:  [id number]

## 2011-05-01 NOTE — Op Note (Signed)
NAMECARYL, [name]                   ACCOUNT NO.:  [id number]   MEDICAL RECORD NO.:  [id number]          PATIENT TYPE:  AMB   LOCATION:  DAY                           FACILITY:  [facility]   PHYSICIAN:  Dennie Maizes, M.D.   DATE OF BIRTH:  [birth year]   DATE OF PROCEDURE:  08/20/2005  DATE OF DISCHARGE:                                 OPERATIVE REPORT   PREOPERATIVE DIAGNOSES:  Urethral stenosis, voiding difficulty, was used,  recurrent urinary tract infections.   POSTOPERATIVE DIAGNOSES:  Urethral stenosis, voiding difficulty, was used,  recurrent urinary tract infections.   OPERATIVE PROCEDURE:  Cystoscopy and urethral dilation.   ANESTHESIA:  Monitored anesthesia care.   SURGEON:  Dennie Maizes, M.D.   COMPLICATIONS:  None.   ESTIMATED BLOOD LOSS:  None.   DRAINS:  None.   INDICATIONS FOR PROCEDURE:  This 77 year old female has voiding difficulty  and recurrent urinary tract infections associated with urethral stenosis.  She was taken to the OR today for cystoscopy, urethral dilation.   With IV sedation, the patient was placed on the cystoscopy table in the  dorsolithotomy position.  The lower abdomen and genitalia were prepped and  draped in a sterile fashion.  A cotton swab was soaked in 2% Xylocaine jelly  and kept in the urethra for 5 minutes. Cystoscopy was done at the 17-French  scope. The urethra, bladder neck, trigone, ureteral orifices were normal. No  abnormality was noted inside the bladder. The bladder capacity was  adequate. Cystoscope was removed. The urethra calibrated to 18-French and  dilation was done up to 30-French with straight metal sounds. The patient  tolerated the procedure well. She was transferred to the PACU in  satisfactory condition.      Dennie Maizes, M.D.  Electronically Signed     SK/MEDQ  D:  08/20/2005  T:  08/20/2005  Job:  [id number]   cc:   Angus G. Renard Matter, MD  [address]  [place]  [place] [address]  Fax: [phone]

## 2011-05-01 NOTE — Op Note (Signed)
NAMEGENETTA, FIERO                   ACCOUNT NO.:  [id number]   MEDICAL RECORD NO.:  [id number]          PATIENT TYPE:  AMB   LOCATION:  DAY                           FACILITY:  [facility]   PHYSICIAN:  Kassie Mends, M.D.      DATE OF BIRTH:  [birth year]   DATE OF PROCEDURE:  01/05/2007  DATE OF DISCHARGE:                               OPERATIVE REPORT   PROCEDURE:  1. Colonoscopy with cold forceps biopsy and cold forceps polypectomy  2. Esophagogastroduodenoscopy with Savary dilation and cold forceps      biopsy   REFERRING PHYSICIAN:  Angus G. Renard Matter, MD   INDICATIONS FOR EXAM:  Ms. [name] is a 77 year old female who was seen as  an inpatient consult for abdominal pain and dysphagia.  She also has  severe constipation.  Her last colonoscopy was in 1992.  She has a  significant past medical history of erosive esophagitis and gastritis.   FINDINGS:  1. Submucosal hepatic flexure mass, approximately 1.5 cm in size.      Cold forceps biopsies obtained of the overlying mucosa.  The area      was tattooed using [place] ink.  2. A 6 mm hepatic flexure polyp removed via cold forceps.  3. Erythema and edema of the antrum and body of the stomach.  Cold      forceps biopsies obtained to evaluate for H. pylori gastritis.  4. Subtle distal esophageal stricture without evidence of mass.      Esophageal dilation performed, successively use of the Savary      dilator from 12 to 14 mm.  5. Otherwise, no diverticula, inflammatory changes, or hemorrhoids      seen in the colon.  No Barrett's seen in the esophagus.  Normal      duodenal bulb and second portion of the duodenum.   RECOMMENDATIONS:  1. Clear-liquid diet for 12 hours, then high-fiber diet.  Patient      given a handout on high-fiber diet, constipation, polyps, and      gastric irritants.  2. No aspirin or anti-inflammatory drugs for 30 days.  3. Over-the-counter Prilosec daily indefinitely.  4. Return-patient visit with Dr. Cira Servant on February  7th at 11:00 a.m.      I will discuss the biopsy results.  Ms. Bjorn will likely need      surgery to remove the submucosal lesion in her colon.  The likely      source for her epigastric abdominal pain and vomiting is gastritis.   MEDICATIONS:  1. Demerol 100 mg IV.  2. Versed 6 mg IV.   PROCEDURAL TECHNIQUE:  Physical exam was performed, and informed consent  was obtained per the patient after explaining the risks, benefits and  alternatives to the procedure.  Patient was connected to the monitor and  placed in the left lateral decubitus position.  Continuous oxygen was  provided by nasal cannula, and IV medicine administered through an  indwelling cannula.  After administration of sedation and rectal exam,  the patient's rectum was intubated, and the scope was passed under  direct visualization to the cecum.  The scope was subsequently removed  slowly, by carefully examined the colon texture, anatomy, and integrity  of the mucosa on the way out.   After the colonoscopy, the patient's esophagus was intubated with the  diagnostic gastroscope.  The scope was advanced under direct  visualization to the cecum.  The scope was subsequently removed slowly  by carefully examining the color, texture, anatomy, and integrity of the  mucosa on the way out.  The scope was advanced into the antrum.  The  Savary guidewire was introduced into the antrum.  The scope was  withdrawn while the Savary guidewire remained in the antrum.  The Savary  dilators were introduced over the guidewire.  The Savary dilator and  guidewire were removed.  The patient was recovered in the endoscopy  suite and discharged home in satisfactory condition.      Kassie Mends, M.D.  Electronically Signed     SM/MEDQ  D:  01/06/2007  T:  01/06/2007  Job:  [id number]   cc:   Angus G. Renard Matter, MD  Fax: [phone]

## 2011-05-01 NOTE — H&P (Signed)
[name], [name]                   ACCOUNT NO.:  [id number]   MEDICAL RECORD NO.:  [id number]          PATIENT TYPE:  AMB   LOCATION:  DAY                           FACILITY:  [facility]   PHYSICIAN:  Dennie Maizes, M.D.   DATE OF BIRTH:  August 28, 1926   DATE OF ADMISSION:  08/20/2005  DATE OF DISCHARGE:  LH                                HISTORY & PHYSICAL   She is coming to the Short Stay Center at [facility] on August 20, 2005.   CHIEF COMPLAINT:  Voiding difficulty, recurrent urinary tract infections,  uretal stenosis.   HISTORY OF PRESENT ILLNESS:  This 77 year old female was referred to me by  Dr. Renard Matter.  She has a history of recurrent urinary tract infections  associated with voiding difficulty.  She also complains of dysuria and mild  hematuria a few weeks ago.  Urine culture was positive for urinary tract  infection and she has been treated with appropriate antibiotics.  She  continues to have voiding difficulty.  She has a past history of recurrent  uretal stenosis.  She has undergone cystoscopy and uretal dilations on  multiple occasions.  Her renal ultrasound and x-ray of the KUB area were  negative for mass, lesions, hydronephrosis, or calculous.  The patient is  brought to the [facility] today for a cystoscopy and uretal dilation  under anesthesia.   PAST MEDICAL HISTORY:  1.  Insulin-dependent-diabetes mellitus.  2.  Peripheral vascular disease.      1.  Status post right femoral popliteal bypass grafting.  3.  History of hypertension.  4.  History of carcinoma of the left breast, status post radical left      mastectomy in 1995.  5.  Status post abdominal hysterectomy.  6.  The patient has undergone several eye operations for glaucoma, detached      retina, cataracts.  7.  Status post cystoscopy and uretal dilation on several occasions.  8.  History of coronary artery disease, status post MI and coronary artery      bypass grafting.   MEDICATIONS:  Insulin, Glucophage, Vasotec, hydrochlorothiazide, and  Prilosec.   ALLERGIES:  1.  PENICILLIN.  2.  SULFA.  3.  CODEINE.  4.  ASPIRIN.   PHYSICAL EXAMINATION:  HEENT:  Normal.  NECK:  No masses.  LUNGS:  Clear to auscultation.  HEART:  Regular rate and rhythm.  No murmurs.  ABDOMEN:  Soft.  No palpable flank mass or CVA tenderness.  Bladder is not  palpable.  PELVIC:  Status post hysterectomy.  No pelvic mass or tenderness.   IMPRESSION:  1.  Recurrent urinary tract infections.  2.  Uretal stenosis.  3.  Voiding difficulty.   PLAN:  Cystoscopy and uretal dilation under anesthesia in the Short Stay  Center.  I have discussed with the patient regarding the diagnosis,  operative details, alternative treatments, outcome, possible risks and  complications and she has agreed for the procedure to be done.      Dennie Maizes, M.D.  Electronically Signed     SK/MEDQ  D:  08/19/2005  T:  08/19/2005  Job:  [id number]   cc:   Angus G. Renard Matter, MD  [address]  [place]  [place] [address]  Fax: [phone]

## 2011-05-01 NOTE — H&P (Signed)
[name], [name]                   ACCOUNT NO.:  [id number]   MEDICAL RECORD NO.:  [id number]          PATIENT TYPE:  INP   LOCATION:  A314                          FACILITY:  [facility]   PHYSICIAN:  Edward L. Juanetta Gosling, M.D.DATE OF BIRTH:  [birth year]   DATE OF ADMISSION:  12/26/2006  DATE OF DISCHARGE:  LH                              HISTORY & PHYSICAL   REASON FOR ADMISSION:  Abdominal discomfort and dysphasia.   HISTORY:  [name] is a 77 year old African-American female who has had  abdominal pain and poor appetite, difficulty swallowing for some 4-5  weeks.  She says her abdomen has had swelling, and she has had  increasing pain.  She has had more pain in the last 24 hours which is  mostly in her right upper quadrant but also some in her mid epigastric  region.  She has significant past medical history of diabetes mellitus.  There is a history of an MI listed in several areas also history,  though, listed of nonobstructive coronary disease.  It is not clear from  her family either.  She has peripheral vascular disease.  She has had  right femoral-popliteal bypass grafting, hypertension, cancer of the  left breast status post mastectomy in 1995.  She has had abdominal  hysterectomy.  She has had several eye surgeries for glaucoma, detached  retina, etc.  She has had multiple cystoscopies and ureteral  dilatations. She has a history of coronary artery occlusive disease.  She has a history of disk disease in her back; plus her episodes of  neuropathy have made it very difficult for her to ambulate.  She has had  multiple revisions of her previous surgeries on her legs.  She has a  history of osteomyelitis of her left third finger requiring amputation.   Surgically, she has had coronary angiography, femoral-to-posterior-  tibial bypass graft, thrombectomy of that graft, the mastectomy,  detached retina, tonsillectomy and adenoidectomy, umbilical hernia  repair, mediastinoscopy.   MEDICATIONS:  Apparently are:  1. Enteric-coated aspirin daily 81 mg.  2. Lantus 75 units daily.  3. Humalog on a sliding scale.  4. Enalapril 20 mg daily.  5. Allopurinol 100 mg daily.  6. Potassium chloride 20 mEq daily.  7. Lasix 20 mg daily.  8. Pilocarpine eye drops.  9. Travatan eye drops.  10.Cosopt eye drops.   She says that she knows that she has gallstones, but she has avoided  having anything done about them because of her multiple other medical  problems, and she says she is simply putting up with it now.   FAMILY HISTORY:  Mother died in her 71s from COPD, her father in his 53s  from COPD and seizure disorder.  There is a history of a brother dying  in his 24s from a massive myocardial infarction.   SOCIAL HISTORY:  She worked at the [employer].  She is  retired.  She does not use any alcohol or tobacco.   REVIEW OF SYSTEMS:  Otherwise pretty much negative.   PHYSICAL EXAMINATION:  GENERAL:  Well-developed, well-nourished  African-  American female who is in no acute distress now.  VITAL SIGNS: Her blood pressure is 130/70, pulse 80 and regular,  respirations 18.  She is afebrile.  HEENT/NECK:  Her pupils are reactive to light and accommodation.  Her  nose and throat are clear.  Her neck is supple without masses.  Mucous  membranes are dry.  I cannot see her fundi well.  CHEST: Clear without wheezes, rales or rhonchi.  HEART:  Regular without murmur, gallop or rub.  ABDOMEN: Mildly tender in the midepigastric and right upper quadrant.  EXTREMITIES:  Showed no edema.  Pulses are present but diminished.  CNS: Grossly intact.  She has decreased sensation bilaterally in both  legs from about the knees down.   LABORATORY WORK:  White count 5800, hemoglobin 13.4, platelets 194.  Comprehensive metabolic profile: BUN 15, creatinine of 1.08, glucose  163, albumin 3.4.  Liver functions normal. Amylase 55 which is normal.  Lipase 20 which is normal. Cardiac  markers: No evidence of coronary  artery occlusive disease.  No evidence of acute MI.   ASSESSMENT:  She has multiple problems.  She does have diabetes.  She  has a history of coronary disease.  She has a history of severe  peripheral vascular disease.  She has neuropathy which is probably a  combination of diabetic and from disk disease.  She has a history of  breast cancer.  She has hyperlipidemia.  She has difficulty swallowing  which may be related to gastroesophageal reflux disease, stricture, etc.  She does have gallstones, but this does not seem to be acute  cholecystitis.   PLAN:  My plan then is to go ahead and  get her into the hospital,  recheck labs, check an ultrasound, ask for GI consultation, and she may  need an EGD.      Edward L. Juanetta Gosling, M.D.  Electronically Signed     ELH/MEDQ  D:  12/26/2006  T:  12/26/2006  Job:  [id number]

## 2011-05-01 NOTE — Consult Note (Signed)
[name], [name]                   ACCOUNT NO.:  [id number]   MEDICAL RECORD NO.:  [id number]          PATIENT TYPE:  AMB   LOCATION:  DAY                           FACILITY:  [facility]   PHYSICIAN:  Kassie Mends, M.D.      DATE OF BIRTH:  1926-04-20   DATE OF CONSULTATION:  03/08/2007  DATE OF DISCHARGE:                                 CONSULTATION   PROBLEM LIST:  1. Submucosal lesion in the hepatic flexure on colonoscopy performed      in January 2008.  2. Tubular adenoma removed during colonoscopy in January 2008.  3. H. pylori infection diagnosed by biopsy in January 2008 status post      treatment. Left breast cancer treated with radical left mastectomy      1995.  4. Diabetes.  5. Coronary artery disease, myocardial infarction.  6. Right femoral pop bypass graft.  7. Hypertension.  8. Glaucoma with detached retina.  9. Multiple cystoscopies with urethral dilation.  10.Multiple urinary tract infection.  11.Degenerative disk disease.  12.Left third digit osteomyelitis status post amputation.  13.Tonsillectomy and adenoidectomy.  14.Gallstones.  15.Peripheral vascular disease.  16.History of lipoma in the transverse colon.   SUBJECTIVE:  [name] is a 77 year old female who is seen as return  patient visit today. She asked for a visit today so that [name]  could accompany [name]. [name] is off from school before Easter  break. She states that Pylera made [name] have frequent stools. She was  having 5-6 per day. She did not take the last day and a half of [name]  therapy. It made [name] stools black. She denies any fever or vomiting. She  is having mild nausea. She was having some cramping but not now. [name]  last dose was Saturday. She is having soft bowel movement this morning.  She did eat eggs, ham, and grits this morning for breakfast. She is not  nauseated currently. Currently she is having a little abdominal  discomfort. They are here to talk about the management of [name]  submucosal  lesion. Since [name] last visit on February 7 she has had a CT scan  dedicated to [name] intestines. CT scan on March7 showed no evidence of the  submucosal lesion.  It was done with rectal contrast after barium enema  prep. She is not enthusiastic about having surgery for this submucosal  lesion, but also remained concerned about what this submucosal lesion  may be.   MEDICATIONS:  1. Lasix 20 mg daily.  2. Enalapril.  3. Allopurinol.  4. Cipro.  5. Humalog sliding scale.  6. Lantus 75 units daily.  7. Potassium.  8. Eye drops for glaucoma.   OBJECTIVE:  VITAL SIGNS:  Weight 226 pounds, height 5 feet 6 inches, BMI  36.5 (severely obese), temperature 98.1, blood pressure 128/82, pulse  72.GENERAL:  She is in no apparent distress, alert and oriented x4.  LUNGS:  Clear to auscultation bilaterally.  CARDIOVASCULAR:  Regular rhythm, no murmur, normal S1, S2.  ABDOMEN:  Bowel sounds present, soft, nontender, nondistended. No rebound or  guarding.   ASSESSMENT:  [name] is a 77 year old female with a submucosal lesion in  [name] hepatic flexure. The differential diagnosis includes benign  leiomyoma and a low likelihood of carcinoid. Biopsies of the overlying  mucosa did not reveal etiology for the submucosal lesion. She has  multiple reservations about undergoing surgery. I do not disagree with  [name] hesitation to have surgery for a lesion that is an undetectable on  CT scan. During colonoscopy this lesion did not have characteristics of  a lipoma. However, if she wants to know definitively what this lesion is  the only option for knowing is surgical. She had biopsy-proven  Helicobacter pylori gastritis and took 9 out of 10 days of therapy.   Thank you for allowing me to see [name] in consultation. My  recommendations follow.   RECOMMENDATIONS:  1. I spent 30 minutes speaking with [name] and [name] about      management options for the submucosal lesion. We discussed  the      benefits and risks of each approach. The approaches includes serial      CT scans, repeat colonoscopy, and surgery. After much discussion      they decided that they would like to pursue repeat colonoscopy to      reexamine this area which was tattooed during [name] last colonoscopy.      She will be scheduled for a colonoscopy in late April2008 with a      TriLyte bowel prep.  2. She has a return patient visit to see me in three months.  3. She is asked to call me next week if [name] abdominal discomfort and      nausea do not resolve after bilateral therapy has been      discontinued.      Kassie Mends, M.D.  Electronically Signed     SM/MEDQ  D:  03/11/2007  T:  03/12/2007  Job:  [id number]   cc:   [name], M.D.  Fax: [phone]

## 2011-05-01 NOTE — H&P (Signed)
NAMEMATELYN, [name]                   ACCOUNT NO.:  [id number]   MEDICAL RECORD NO.:  [id number]          PATIENT TYPE:  INP   LOCATION:  IC06                          FACILITY:  [facility]   PHYSICIAN:  Catalina Pizza, M.D.        DATE OF BIRTH:  Oct 14, 1926   DATE OF ADMISSION:  04/12/2007  DATE OF DISCHARGE:  LH                              HISTORY & PHYSICAL   PRIMARY M.D.:  Angus G. Renard Matter, M.D.   NEUROLOGIST:  Kofi A. Gerilyn Pilgrim, M.D.   CARDIOLOGIST:  Jesse Sans. Wall, MD, [facility].   GASTROENTEROLOGIST:  Kassie Mends, M.D.   CHIEF COMPLAINT:  Nausea and vomiting and severe dizziness.   HISTORY OF PRESENT ILLNESS:  [name] is a 77 year old African American  female with multiple medical problems, as outlined below, who apparently  had a recent follow-up colonoscopy with Dr. Cira Servant with polyp removal  approximately 6 days ago. She states that she has not been totally  eating back to normal since then, but woke up approximately 5:00 this  morning, sat up in bed, and has a severe dizziness-type episode, felt  like she was going to black out, and lied back down. Did have a little  nausea at that time, with some abdominal pain. Called her son, who came  over and checked her blood pressure, which was significantly elevated,  per report in the 200s systolically. Throughout the day, then continued  any time with movement of her head to the left or right or sitting up to  have significant dizziness and nausea and vomiting, and had several  episodes of vomiting, last producing greenish-type fluid.  When I  arrived to see her in the emergency department, the patient was sitting  in bed eating mashed potatoes and meatloaf without any problems.  She  denies any specific nausea at this time. She did state that when she  closed her eyes she still did have some dizziness when turning her head  more rapidly.  She was given some Antivert and Phenergan in the  emergency department, which may have relieved some of  her symptoms.  She  will be admitted for further workup of this dizziness and vertigo.   MEDICATIONS:  1. Vasotec 20 mg p.o. daily.  2. Lasix 20 mg p.o. daily.  3. Potassium chloride 10 mEq p.o. daily.  4. Lantus 77 units subcutaneously q.a.m.  5. Humalog sliding scale, unknown exact regimen.  6. Pilocarpine 2%, 1 drop in each eye 4 times a day.  7. Allopurinol 100 mg p.o. daily.  8. Lipitor p.o. daily, which was just restarted approximately 2 weeks      ago.   ALLERGIES:  1. CODEINE.  2. PENICILLIN.  3. SULFA.   PAST MEDICAL HISTORY:  1. Significant for colonoscopy done in January 2008, which showed a      submucosal lesion, tubular adenoma. Had a repeat done which did not      show any significant metaplasia or dysplasia, done approximately 6      days ago.  2. Helicobacter pylori infection which was treated  in January 2008.  3. Left breast cancer, with radical left mastectomy.  4. Diabetes mellitus type 2 insulin dependent. Last A1c 7.3, done 2      weeks ago.  5. Coronary artery disease, status post myocardial infarction.  6. Right fem-pop bypass graft, she states bilaterally.  7. Hypertension.  8. Glaucoma, with detached retina.  9. Multiple urinary tract infections.  10.Degenerative disc disease.  11.Left third digit osteomyelitis, status post amputation.  12.Tonsillectomy and adenoidectomy.  13.History of gallstones.  14.Peripheral vascular disease.  15.History of lipoma in the transverse colon.   REVIEW OF SYSTEMS:  The patient states that she chronically hurts all  over. Has had a mild headaches for the last several days.  Appetite is  not totally back to normal following the colonoscopy. Has not noticed  any weight loss or weight gain.  Denies any chest pain.  No shortness of  breath.  Did have a little bit of epigastric-type pain, but none at this  time.  No change in bowel habits or bladder.  No dysuria.  NEUROLOGIC:  Denies any deficits.  Does have  generalized weakness in her lower  extremities which is chronic.  Is supposed to use a walker, but rarely  does so. States that her vision was checked yesterday by a doctor, and  stated that her glaucoma was doing well. Did not change anything as far  as her medicines were concerned.   FAMILY HISTORY:  Mother died in her 19s of COPD, father in his 91s of  COPD and seizure disorder. History of brother who died of MI in his 66s.   SOCIAL HISTORY:  She is retired from [employer].  Denies any  alcohol or tobacco use. Has multiple children at her bedside at this  time.   PHYSICAL EXAMINATION:  VITAL SIGNS: Blood pressure initially 182/86,  recheck 148/79, pulse was 76, respirations are 20, saturating 100% on  room air.  GENERAL:  This is am obese African American female, sitting up in bed in  no acute distress.  HEENT:  Pupils are equal, round, and reactive to light and  accommodation, but very sluggish in nature.  Oropharynx is clear.  NECK:  I did not appreciate any JVD.  No carotid bruits.  CHEST:  Clear to auscultation bilaterally.  HEART:  Regular rate and rhythm with distant heart sounds.  ABDOMEN:  Protuberant.  Mild epigastric tenderness to palpation, but  positive bowel sounds.  No rebound tenderness or guarding.  No  hepatosplenomegaly.  EXTREMITIES:  Trace lower extremity edema bilaterally which is chronic.  Palpable pulses in all extremities.  SKIN:  Does have erythematous rash on the left shoulder, with  excoriations. Also has signs of psoriasis on her elbows bilaterally. Has  multiple surgical scars on her abdomen and on her legs from previous  vascular surgeries.  NEUROLOGIC:  Cranial nerves II-XII intact.  Does have mild leftward gaze  on left eye when looking straight forward.  Unclear if this is chronic  or not, but very mild. Does have some mild nystagmus on eye movements  with rapid phase going to the left.  Essentially all extraocular movements were  intact.  Does have generalized weakness in all  extremities, which the patient states is her baseline, but able to move  all extremities without deficit. All cranial nerves appear to be intact  at this time.   LABORATORY DATA OBTAINED:  CBC showed white count of 7, hemoglobin 14.4,  platelet count of 171.  CMET shows sodium 139, potassium 3.8, chloride  102, CO2 29, glucose 193, BUN 9, creatinine 0.9, GFR greater than 60,  total bilirubin 0.9, alk phos 90, SGOT 18, SGPT 15, total protein 7.3,  albumin of 3.7, calcium of 9.2.  UA shows specific gravity 1.015, urine  glucose 100, protein of 30, negative nitrites, negative leukocyte.  Urine microscopy only showed 3-6 red blood cells, otherwise is clear.   IMAGES OBTAINED:  Acute abdominal series was done which showed  cardiomegaly and mild interstitial edema, but negative abdomen.  Normal  bowel gas pattern.  Had CT of her head which revealed no acute  intracranial abnormality, stable paraventricular white matter change.  Has an MRI which is pending at this time with MRA.   IMPRESSION:  This is a 77 year old African American lady with multiple  medical problems, who presents with new onset of vertigo with nausea and  vomiting. Has multiple possibilities for causing this vertigo, but will  be admitted and follow up on the MRI as well as get Dr. Gerilyn Pilgrim to see  the patient.   ASSESSMENT AND PLAN:  1. Vertigo.  Will continue with the Phenergan and Antivert as needed      for the nystagmus and nausea and vomiting. Question of neuritis      versus some intracerebral abnormality such as stroke or posterior      vascular problems, given her previous history of multiple vascular      issues.  Question whether this is related to some medicine. Was      recently started on Lipitor, and the patient states that she always      has multiple problems that arise when she starts back on Lipitor.      She also had a colonoscopy and also had her eyes  checked the day      before admission, all of which could have thrown her system off.      Will get Dr. Gerilyn Pilgrim to see the patient for her vertigo,      nystagmus, and awaiting MRI results. Rule out stroke.  We will go      ahead and give her a dose of aspirin now.  Unclear why she is not      on aspirin routinely.  2. Diabetes mellitus type 2.  Will decrease her Lantus down to 25      until noting that she is eating normally. Will continue with      sliding scale insulin as well q.a.c. and at bedtime, with sensitive      sliding scale.  3. Glaucoma.  Will continue with her pilocarpine as previously      prescribed.  4. Severe hypertension.  Unclear of her exact baseline, but did have      significant elevation this morning.  Whether this is related to      anxiety, or whether the high blood pressure is actually causing      some of the vertigo. Will continue on the Lasix and Vasotec for      this. 5. Peripheral vascular disease, coronary artery disease.  She is to      follow up with Dr. Daleen Squibb in approximately 7 days for routine      evaluation on Apr 20, 2007.   DISPOSITION:  The patient will be admitted to telemetry and awaiting MRI  results, and will recheck blood work in the morning, and get input from  Dr. Gerilyn Pilgrim.      Catalina Pizza,  M.D.  Electronically Signed     ZH/MEDQ  D:  04/12/2007  T:  04/13/2007  Job:  [id number]

## 2011-05-01 NOTE — Group Therapy Note (Signed)
NAMESHAUNESSY, DOBRATZ                   ACCOUNT NO.:  [id number]   MEDICAL RECORD NO.:  [id number]          PATIENT TYPE:  INP   LOCATION:  IC06                          FACILITY:  [facility]   PHYSICIAN:  Catalina Pizza, M.D.        DATE OF BIRTH:  04/08/1926   DATE OF PROCEDURE:  04/13/2007  DATE OF DISCHARGE:                                 PROGRESS NOTE   SUBJECTIVE:  Ms. Blassingame is a 77 year old African-American female who was  admitted for vertigo and with nausea and vomiting.  Was given medicines  in the emergency department and improved.  This morning she states she  still has a little dizziness but no more nausea and vomiting.  Has been  eating okay without any difficulty.  She has not noticed any change in  her pain, which has been chronic, and has not had any significant change  focally.  Still awaiting consult from Dr. Gerilyn Pilgrim today.   PHYSICAL EXAMINATION:  VITAL SIGNS:  Temperature is 97.7, blood pressure  is 140/66 but has been trending up to the 160-170s range as well  systolically.  Heart rate is 85.  Saturating 95-98% on room air.  GENERAL:  Obese African-American female lying in bed, in no acute  distress.  HEENT:  Unremarkable.  No significant change in gaze.  CHEST:  Clear to auscultation bilaterally.  CARDIAC:  Regular rate and rhythm.  No murmurs, gallops or rubs.  ABDOMEN:  Protuberant.  Still has some mild epigastric tenderness but  otherwise doing well.  Positive bowel sounds.  EXTREMITIES:  Trace lower extremity edema bilaterally is chronic.  NEUROLOGIC:  The patient continues to have a small amount of nystagmus  to the left but otherwise no specific deficits.  Moving all extremities.  No specific weakness.  No slurred speech.   LABORATORY DATA:  Had a cardiac panel this morning that showed a CK of  183, MB of 4, a relative index of 2.2, troponin I of 0.07, which was  stable from last evening.  Did have elevated D-dimer at 1.44.  A CBC  today showed a white count of 6.2,  hemoglobin of 12.7, platelet count  135.  BMET shows sodium 138, potassium 4.0, chloride 104, CO2 29,  glucose 193, BUN 12, creatinine 1.37, calcium of 8.4.   New images:  A preliminary report on the MR of the brain did not show  any signs of acute intracranial abnormality.  Did show dense white  matter changes.  Did show bilateral parotid lesions.  MRA of brain lists  on preliminary report as occluded right middle cerebral artery as well  as some stenosis in the MPCA branches.   IMPRESSION:  This is a 77 year old African-American female with multiple  medical problems, who presents with vertigo, dizziness and vomiting of  unknown source with multiple issues that could be causing this.   ASSESSMENT AND PLAN:  1. Vertigo.  She states that her dizziness and spinning sensation is      better and has not had any more nausea.  She is on meclizine  and      Phenergan p.r.n.  We will await Dr. Ronal Fear assessment as well.  2. Right middle cerebral artery occlusion.  Unclear if this is new or      old.  She does have significant atherosclerotic disease as outlined      by multiple heart and femoral-popliteal bypass grafts that she has      had previous.  The patient likely will need to be on something to      help out with this such as aspirin or Plavix or Aggrenox.  Will get      Dr. Ronal Fear assessment of this finding.  3. Diabetes mellitus type 2.  Is on much lower dose of Lantus and      sliding scale insulin at this time.  We will continue to monitor      and increase Lantus back to normal as the patient is eating and      drinking.  No further studies needed.  4. Glaucoma.  Continue with the Pilocarpine.  5. Severe hypertension.  Blood pressures have been trending down,      although still elevated from her baseline per her daughter.  6. Peripheral vascular disease, coronary artery disease.  She did have      a slight increase in troponin and mild CK but MB fraction has been       normal.  Has not had any chest pain at this time.  Did review EKG      which, compared to previous EKG done, was still stable.   DISPOSITION:  The patient will be seen by Dr. Gerilyn Pilgrim today to see if  there is anything else that we can offer her as far as treating this  vertigo or whether this is related to findings of the occlusion on her  MCA.  It is very hard to tell.      Catalina Pizza, M.D.  Electronically Signed     ZH/MEDQ  D:  04/13/2007  T:  04/13/2007  Job:  [id number]

## 2011-05-01 NOTE — Discharge Summary (Signed)
[place]  Patient:    [name], [name] Visit Number: [id number] MRN: [id number]          Service Type: SUR Location: [id number] Attending Physician:  [name] Dictated by:   [name], P.A. Admit Date:  04/14/2002 Discharge Date: 04/15/2002   CC:         [name], M.D.  [name], M.D. [facility], [name]   Discharge Summary  DATE OF BIRTH:  1926-04-01  DISCHARGE DIAGNOSIS:  Stenosis of left-femoral-to-distal-posterior-tibial bypass at the level of interposition of vein graft repair site.  (This repair was done postoperative day #1 after placement of left-femoral-to-distal-posterior-tibial graft by [name].  The interposition graft replaced a narrow segment of the original greater saphenous vein bypass.  SECONDARY DIAGNOSES:  1. History of severe bilateral infrainguinal arterial occlusive disease.  2. Hypertension.  3. Atherosclerotic coronary artery disease with history of myocardial     infarction.  4. Type 2 insulin-dependent diabetes mellitus.  5. History of breast cancer status post mastectomy in 1995 with followup     chemotherapy.  6. Obesity.  7. Diabetic neuropathy.  8. Chronic back pain.  9. Dyspepsia. 10. Psychiatric history. 11. Degenerative joint disease. 12. History of osteomyelitis of left third finger. 13. History of urinary tract infections, incontinence. 14. Status post right-femoral-to-peroneal bypass October 07, 1995,     [name], surgeon. 15. Status post left-femoral-to-distal-posterior-tibial bypass     March 27, 1999, [name]. 16. Status post thrombectomy of left-femoral-to-distal-posterior-tibial     bypass with repair of stenotic segment the bypass with right greater     saphenous vein interposition graft March 28, 1999, [name]. 17. Status post angioplasty left lower extremity April 10, 2002. 18. Status post left  mastectomy in 1995 with chemotherapy. 19. History of detached retina status post repair x 3, once on the right,     twice on the left. 20. Tonsillectomy and adenoidectomy. 21. Partial hysterectomy. 22. Status post umbilical herniorrhaphy. 23. Status post appendectomy. 24. History of cystoscopy with dilatation in 1972 and then again in October     2002. 25. Status post mediastinoscopy, lymph node biopsy, and esophageal dilatation. 26. Status post amputation of left third digit.  PROCEDURES:  Apr 14, 2002, vein patch angioplasty of the interposition vein graft at the mid point of the left-femoral-to-distal-posterior bypass.  (The patch was from the lesser saphenous vein.)  [name], surgeon.  DISCHARGE DISPOSITION:   [name] is ready for discharge on postoperative day #1.  She has been afebrile in the postoperative period.  Her mental status has been clear.  Pain is controlled with oral analgesics.  Her incisions are healing nicely.  There was no evidence of erythema, swelling, or drainage. She is ambulating independently.  She has full bowel tract function.  She has remained hemodynamically stable.  Her serum glucose has been under good control.  DISCHARGE MEDICATIONS:  1. Darvocet-N 100 one to two tablets p.o. q.3-4h. p.r.n. pain.  2. Vasotec 20 mg 1 p.o. q.d.  3. Lipitor 10 mg 1 p.o. q.h.s.  4. Hydrochlorothiazide 20 mg 1 p.o. q.d.  5. Cosopt ophthalmic solution 1 drop both eyes twice daily.  6. Lantus 65 units subcutaneously at bedtime.  7. Humalog daily as required per sliding scale.  ACTIVITY:  Walk daily to keep up strength.  DIET:  Low-sodium, low-cholesterol, 1800 calorie ADA diet.  WOUND CARE:  She may shower beginning Monday, May 5.  She is to sponge bathe until then.  FOLLOWUP:  She has an office visit followup with [name] on Wednesday, Apr 19, 2002, at 8:50 in the morning.  BRIEF HISTORY:  [name] is a 77 year old African-American female  with known history of infrainguinal arterial occlusive disease.  She is status post bypasses in both lower extremities.  She has been having regular followup of her bypass grafts at the office of [facility].  Her last ankle brachial index showed decrease on the left from 100% to 83%.  This suggested a possible vein graft stenosis.  Her right lower extremity bypass ankle brachial index was 97%.  The patient underwent arteriogram on April 10, 2002, and this showed a definite stenotic section, possibly at the interposition of vein graft which was placed after thrombectomy of the original left-femoral-to-distal-posterior-tibial bypass by [name].  The patient presents for vein patch angioplasty of the left-femoral-to-distal-posterior-tibial bypass using vein from the lesser saphenous vein.  HOSPITAL COURSE:  As described in the Discharge Disposition. Dictated by:   [name], P.A. Attending Physician:  [name] DD:  04/14/02 TD:  04/17/02 Job: [id number]

## 2011-05-01 NOTE — Discharge Summary (Signed)
[name], [name]                   ACCOUNT NO.:  [id number]   MEDICAL RECORD NO.:  [id number]          PATIENT TYPE:  INP   LOCATION:  A216                          FACILITY:  [facility]   PHYSICIAN:  Angus G. Renard Matter, MD   DATE OF BIRTH:  06-12-1926   DATE OF ADMISSION:  12/10/2007  DATE OF DISCHARGE:  12/29/2008LH                               DISCHARGE SUMMARY   A 77 year old female admitted 12/10/07 and discharged 12/12/07, had 2  days of hospitalization.   DIAGNOSES:  1. Syncopal episode.  2. Cerebral artery disease with total occlusion of right middle      cerebral artery, documented by MRA.  3. Carotid artery disease.  4. Hypertension.  5. Hyperlipidemia.  6. Insulin-dependent diabetes.   CONDITION:  Stable and improved at time of discharge.   HISTORY OF PRESENT ILLNESS:  This 77 year old female was admitted with a  history of documented cerebrovascular disease by MRA in April 2008.  She  was sitting at a table with family members, had 2 syncopal episodes  where she slumped over, did not have any seizure activity, denied any  palpitations or anginal type pain.  She was a little confused  afterwards.  She was admitted to rule out dysrhythmia due to metabolic  abnormalities, ischemia, rule out carotid artery disease.   PHYSICAL EXAMINATION:  VITAL SIGNS:  Blood pressure 132/82, pulse 80,  respirations 18.  HEENT:  Eyes: PERRLA.  TMs negative.  Oropharynx benign.  NECK:  Supple.  No JVD or thyroid abnormalities.  LUNGS:  Clear to P&A.  HEART:  Regular rhythm, no murmurs.  ABDOMEN:  No palpable organs or masses.  No organomegaly.  EXTREMITIES:  Free of edema.  NEUROLOGIC:  No focal deficit.   LABORATORY DATA:  CBC, WBC 6700 with hemoglobin 13.8, hematocrit 40.6.  Chemistries, sodium 139, potassium 3.8, chloride 105, CO2 31, glucose  219, BUN 13, creatinine 1.01.  Subsequent chemistries, on 12/28, sodium  140, potassium 3.4, chloride 106, CO2 26, glucose 172, BUN 13,  creatinine 1.03.   X-RAYS:  CT of the head negative for bleeding or acute intracranial  process, cortical atrophy.  Carotid Doppler ultrasound, mild right  carotid plaque without hemodynamically significant stenosis.   HOSPITAL COURSE:  The patient at the time of her admission was placed on  intravenous fluids, normal saline at 100 mL an hour.  She was continued  on aspirin 81 mg daily and a 2 g sodium diabetic diet.  She was  continued on sliding scale NovoLog moderate, Lasix 20 mg daily was  continued, Lantus insulin 25 units each a.m. and each p.m., enalapril 20  mg daily.  The patient had no further syncopal episode after admission.  Vital signs were monitored.  She had did have a CT of head on admission,  which showed some mild atrophy and no acute intracranial process.  The  carotid Doppler ultrasound did not show significant stenosis.  She did  not show an arrhythmia.  Remained alert and oriented.  Speech remained  clear.  It was unclear whether her syncopal episode was an  episode of  low blood sugar or some other arrhythmia.  She was discharged on the  second hospital day and was asked to continue Lantus insulin 25 units  b.i.d., sliding scale Humalog insulin, Lasix 20 mg daily, K-Dur 20 mEq  b.i.d., enalapril 20 mg daily, pilocarpine hydrochloride drops to eye  four times daily, allopurinol 100 mg daily, aspirin 81 mg daily.      Angus G. Renard Matter, MD  Electronically Signed     AGM/MEDQ  D:  12/20/2007  T:  12/20/2007  Job:  [id number]

## 2011-05-01 NOTE — Group Therapy Note (Signed)
[name]                   ACCOUNT NO.:  [id number]   MEDICAL RECORD NO.:  [id number]          PATIENT TYPE:  INP   LOCATION:  A213                          FACILITY:  [facility]   PHYSICIAN:  Angus G. Renard Matter, MD   DATE OF BIRTH:  1926-04-20   DATE OF PROCEDURE:  06/13/2005  DATE OF DISCHARGE:                                   PROGRESS NOTE   SUBJECTIVE:  This patient was admitted with chief problem being episode of  near syncope for approximately 1 week with left-sided weakness.  She is an  insulin-dependent diabetic and has had some difficulty voiding.  Chest x-ray  showed mild cardiomegaly, mild edema.  CT of the head showed small area of  low density subsequently over left external capsule.  Blood sugars have  ranged from 144/251.   OBJECTIVE:  VITAL SIGNS:  Blood pressure 116/49, respirations 20, pulse 83,  temperature 97.  HEART:  Regular rhythm.  LUNGS:  Diminished breath sounds.  ABDOMEN:  No palpable organs or masses.  NEUROLOGIC:  No focal deficits.   ASSESSMENT:  Patient was admitted with near syncope of undetermined  etiology, does have insulin-dependent diabetes, does have evidence of  pulmonary congestion and cardiomegaly.   PLAN:  Plan to continue current regimen.  We will obtain neurology consult.  Have ordered BNP.  We will start low dose Lasix.       AGM/MEDQ  D:  06/13/2005  T:  06/13/2005  Job:  [id number]

## 2011-05-01 NOTE — Discharge Summary (Signed)
[name], [name]                   ACCOUNT NO.:  [id number]   MEDICAL RECORD NO.:  [id number]          PATIENT TYPE:  INP   LOCATION:  A213                          FACILITY:  [facility]   PHYSICIAN:  Angus G. Renard Matter, MD   DATE OF BIRTH:  07/19/1926   DATE OF ADMISSION:  06/12/2005  DATE OF DISCHARGE:  07/05/2006LH                                 DISCHARGE SUMMARY   A 77 year old African-American female admitted June 12, 2005, and discharged  June 17, 2005, five days' hospitalization.   DIAGNOSES:  1.  Near syncopal episodes of undetermined etiology.  2.  Insulin-dependent diabetes.  3.  Hypoglycemia.  4.  Urinary tract infection.  5.  Previous history of slight stroke.   CONDITION:  Stable and improved at the time of her discharge.   A 77 year old female, chief problem being episodes of near syncope for  approximately one week with left-sided weakness.  The patient stated she is  aware of questions asked of her but cannot respond.  She does not relate  this to low blood sugar, although she is an insulin-dependent diabetic.  Was  admitted for further evaluation.   PHYSICAL EXAMINATION:  GENERAL:  Alert female.  VITAL SIGNS:  Blood pressure 130/80, pulse 60, respirations 18.  HEENT:  Eyes:  PERRLA.  TM:  Negative.  Oropharynx:  Benign.  NECK:  Supple.  No JVD or thyroid abnormalities.  HEART:  Regular rhythm with no murmurs but cardiomegaly.  LUNGS:  Clear to P&A.  BREASTS:  Normal.  No masses.  Left mastectomy scar.  ABDOMEN:  No palpable organs or masses.  NEUROLOGIC:  No focal deficits.   LABORATORY DATA:  Admission CBC:  WBC 7000, hemoglobin 13.1, hematocrit  36.5.  Chemistries:  Sodium 137, potassium 4.2, chloride 102, CO2 29,  glucose 233, BUN 22, creatinine 1.3, calcium 9.6.  Total protein 6.9.  Liver  enzymes:  SGOT 21, SGPT 20, alkaline phosphatase 82.  Urinalysis:  11-20  wbc's.  Chest x-ray:  Mild cardiomegaly.  Diffuse peribronchial thickening  with mild edema.  Head  CT:  Small area of low density in the left external  capsule.  No evidence of hemorrhage or mass.  Ultrasound, carotid Doppler:  Plaque formation bilaterally.  Left ICA falls at lower border of 50-69%.  Echocardiogram:  Normal left ventricular function.  Mild concentric left  ventricular hypertrophy.   HOSPITAL COURSE:  The patient, at the time of her admission, was placed on  1/2 normal saline at [facility] rate.  A 2000-calorie ADA diet.  Vital signs and  neuro checks q.i.d.  Accu-Cheks a.c. and h.s.  Humalog insulin according to  sliding scale.  Lantus insulin 75 units daily.  Vasotec 10 mg daily.  Lipitor 10 mg daily.  Aspirin 81 mg daily.  Lyrica 75 mg b.i.d.  The patient  remained on sliding-scale insulin.  Her Lantus insulin was changed to 65  units daily.  On June 14, 2005, the patient had an episode of hypoglycemia.  The patient was seen in consultation by Dr. Gerilyn Pilgrim.  She had an  echocardiogram  which showed normal left ventricular function and mild  concentric left ventricular hypertrophy.  Dr. Gerilyn Pilgrim noted the CT scan  showed some chronic old findings, a small area of hypodensity in the  external capsule, and the patient was noted to have a 50-69% internal  carotid artery blockage.  She did have an episode of hypoglycemia during her  hospital stay, and her insulin dosage was changed with Lantus insulin  reduced.  She improved during her hospital stay and was able to be  discharged on the fifth hospital day.  She was discharged on Lantus insulin  60 units daily and Cipro 500 mg b.i.d.       AGM/MEDQ  D:  07/15/2005  T:  07/15/2005  Job:  [id number]

## 2011-05-01 NOTE — H&P (Signed)
NAME:  [name], [name]                             ACCOUNT NO.:  [id number]   MEDICAL RECORD NO.:  [id number]                   PATIENT TYPE:  INP   LOCATION:  3735                                 FACILITY:  [facility]   PHYSICIAN:  Creta Levin, M.D. [facility]      DATE OF BIRTH:  1926-01-16   DATE OF ADMISSION:  04/27/2004  DATE OF DISCHARGE:                                HISTORY & PHYSICAL   PRIMARY CARE PHYSICIAN:  Foster Simpson, M.D.   CARDIOLOGIST:  Jesse Sans. Wall, M.D.   CHIEF COMPLAINT:  Chest pain.   HISTORY:  [name] with coronary artery disease,  diabetes, hypertension, hyperlipidemia, and peripheral vascular disease, who  was in her usual state of health until approximately 11 p.m. on Apr 26, 2004  when she awoke in a panic kicking my legs and developed some shortness of  breath and chest discomfort, along with palpitations.  Her symptoms  resolved, and she went back to sleep.  She awoke one more time, however,  with similar symptoms.  At this point, she called her daughter, who drove  her to [facility].   The patient has not had any chest discomfort since coming to the emergency  department.  She says that the pain did go into her neck but not into her  jaw of left upper extremity.  The pain was mild to moderate in intensity,  and it was the shortness of breath that was of greater concern.  She did not  have any presyncope or syncope, but did have the palpitations as mentioned.   PAST MEDICAL HISTORY:  1. Coronary artery disease with a myocardial infarction in the early 1980's.     She says she has had a heart catheterization in 1983 or 1984.  She also     claims to have had a heart catheterization prior to one of her vascular     surgeries.  2. Diabetes.  3. Hypertension.  4. Hyperlipidemia.  5. Peripheral vascular disease, status post peripheral bypass surgery with     revisions on the left femoral-tibial bypass at least once, if not  twice.  6. Chronic back pain.  7. Neuropathy.  8. Hysterectomy.  9. Breast cancer, status post left mastectomy and chemotherapy.   ALLERGIES:  1. PENICILLIN which causes a rash.  2. SULFA which causes GI upset.  3. CODEINE which causes GI upset.   MEDICATIONS:  1. Aspirin 81 mg daily.  2. Trusopt 2%, one OU b.i.d.  3. Vasotec 20 mg daily.  4. Heparin drip coming from [facility].  5. Hydrochlorothiazide 25 mg daily.  6. Lantus 70 units subcutaneous daily.  7. Metoprolol 5 mg IV b.i.d., but no beta blocker at home.  8. Timoptic 0.5% OU b.i.d.  9. Lipitor 10 mg q.h.s.   SOCIAL HISTORY:  [place]  She is retired,  and worked for the  [employer].  She lives at home with her  son.  Her husband died recently, and this has been quite distressful.  She  does not smoke tobacco or drink alcohol.   FAMILY HISTORY:  Noncontributory.   REVIEW OF SYSTEMS:  Positive for some chronic night chills or sweats.  She  also has some right ear discomfort.  CARDIAC AND PULMONARY:  Her cardiac and  pulmonary complaints are as mentioned in the HPI.  At baseline, she has  probably class II angina.  GU:  She has had a recent urinary tract  infection, and was treated with antibiotics.  NEUROPSYCHIATRIC:  She has  some complaints of mood disturbance and depression since losing her husband  2 months ago.  MUSCULOSKELETAL:  She has neck and back pain, as well as  chronic leg pain.  GI:  She has occasional nausea.   PHYSICAL EXAMINATION:  VITAL SIGNS:  She is afebrile.  Her blood pressure is  129/68, heart rate of 80.  She is saturating 98% on room air.  GENERAL:  She is an obese, pleasant, elderly woman in no acute distress.  HEENT:  Mucous membranes are moist.  She is edentulous.  She is anicteric.  NECK:  Supple without thyromegaly, bruits, or JVD.  CARDIOVASCULAR:  Regular rate and rhythm.  Normal S1, normal S2.  No S3, no  S4, no murmur, no rub.  LUNGS:   Clear.  ABDOMEN:  She did have some diffuse tenderness throughout, but this was  distractible.  Her bowel sounds were normal.  She had no masses or bruits.  RECTAL:  Heme negative.  EXTREMITIES:  Trace edema.  The femoral pulses were markedly diminished  without bruit.  The distal pulses were trace.  NEUROLOGIC:  Nonfocal.   Chest x-ray is not available.  ECG revealed normal sinus rhythm at a rate of  77.  She had a first degree AV block, but no evidence of ischemia or  infarction.   LABORATORY DATA:  The CBC was within normal limits, platelet count being  178.  Her Chem-7 was significant for a glucose that was elevated at 225.  Her BUN and creatinine were 20 and 1.3.  Her GI panel was normal.  Her total  CK initially was 268, and then came down to 226.  MB 8.5, down to 6.3.  Troponin 0.7, down to 0.6.  She had a BNP level of less than 30.  PTT was  33, PT 13.2, with an INR of 1.0.   ASSESSMENT AND PLAN:  Chest pain, somewhat atypical.  The patient does have  multiple coronary risk factors, and I would not be surprised if she does  have significant coronary disease; however, I am not sure that it is  explaining her current presentation.  She will be treated with an aspirin,  beta blocker, statin, and ACE inhibitor.  Heparin will be continued while  she is ruled out for myocardial infarction.  We will obtain her medical  records to  see if she has undergone coronary angiography within the past couple of  years.  If this has not been performed, or if she had marginal disease, it  may be beneficial to proceed with cardiac catheterization.  If she had  minimal disease, it may be reasonable to pursue a functional study.  Creta Levin, M.D. [facility]    RPK/MEDQ  D:  04/27/2004  T:  04/27/2004  Job:  [id number]   cc:   Foster Simpson, M.D.   Thomas C. Wall, M.D.

## 2011-05-01 NOTE — Op Note (Signed)
[facility]  Patient:    [name], [name] Visit Number: [id number] MRN: [id number]          Service Type: SUR Location: [id number] Attending Physician:  [name] Dictated by:   [name], M.D. Proc. Date: 04/14/02 Admit Date:  04/14/2002 Discharge Date: 04/15/2002                             Operative Report  PREOPERATIVE DIAGNOSIS:  Vein graft stenosis of left femoral to distal posterior tibial artery bypass graft.  POSTOPERATIVE DIAGNOSIS:  Vein graft stenosis of left femoral to distal posterior tibial artery bypass graft.  PROCEDURE:  Vein patch angioplasty of vein graft stenosis of left femoral to distal posterior tibial artery bypass graft (using left lesser saphenous vein).  SURGEON:  [name], M.D.  ASSISTANT:  Loura Pardon, P.A.  ANESTHESIA:  General.  INDICATION:  This is a 77 year old woman who had undergone a left femoral to distal posterior tibial artery bypass graft in April 2000.  One segment of the vein graft had been small, and she had to be taken back to the operating room on postoperative day #1 for thrombectomy of her graft with replacement of a section of the vein graft which was felt to be small using an interposition reversed saphenous vein graft, which was taken from the right leg.  She had been doing well and came in for a routine graft duplex and was noted to have markedly increased velocity in one focal area of her vein graft on the left side in the midcalf.  Arteriogram confirmed a tight focal stenosis of this, and she was brought in for elective revision.  DESCRIPTION OF PROCEDURE:  The patient was brought to the operating room and received a general anesthetic.  The left lower extremity was prepped and draped in the usual sterile fashion.  Of note, she was originally placed prone and a short segment of lesser saphenous vein was harvested through a longitudinal  incision and gently extended up with papaverine.  It was felt to be of adequate size but was not a real large vein.  Branches were divided between 3-0 silk ties.  This incision was then closed with a deep layer of 3-0 Vicryl and the skin closed with staples.  The patient was then placed supine and the area of the vein graft stenosis was identified using a Doppler.  A longitudinal incision was made over this area and this segment of vein graft was dissected free.  The stenosis appeared to be in one of the venovenous anastomoses.  This was dissected out and then as there was a slight size discrepancy between the vein graft and the lesser saphenous vein which was harvested, I decided to do a vein patch instead of replacing this segment. The area of the stenosis was opened longitudinally, and both limbs of the graft were flushed with heparinized saline.  The area of the stenosis was opened longitudinally, and both ends of the graft were flushed with heparinized saline.  The vein graft was then sewn as a patch using continuous 6-0 Prolene suture.  Prior to completing the patch closure, the vessels were backbled and flushed appropriately and the anastomosis completed.  Flow was re-established through the graft.  There was an excellent posterior tibial signal with the Doppler.  Hemostasis was obtained in the wound, and the wound was closed with a deep layer of  3-0 Vicryl and the skin closed with staples. Sterile dressing was applied.  The patient tolerated the procedure well and was transferred to the recovery room in satisfactory condition.  All needle and sponge counts were correct. Dictated by:   [name] Kindle Edilia [name], M.D. Attending Physician:  [name] DD:  04/14/02 TD:  04/17/02 Job: [id number]

## 2011-05-01 NOTE — Group Therapy Note (Signed)
[name], [name]                   ACCOUNT NO.:  [id number]   MEDICAL RECORD NO.:  [id number]          PATIENT TYPE:  INP   LOCATION:  A314                          FACILITY:  [facility]   PHYSICIAN:  Angus G. Renard Matter, MD   DATE OF BIRTH:  1926-09-18   DATE OF PROCEDURE:  DATE OF DISCHARGE:                                 PROGRESS NOTE   This patient was admitted to the hospital with abdominal pain and  dysphagia.  She does have a history of hypertension, coronary artery  disease, fem-pop bypass surgery in legs, gastroesophageal reflux disease  and dysphagia.  Her condition remains stable although she is continuing  upper abdominal discomfort.  She has been seen by gastroenterologist  service, Dr. Cira Servant, who plans EGD and further evaluation on Thursday.  The patient is to remain off aspirin during this period of time.   OBJECTIVE:  VITAL SIGNS:  Blood pressure 131/74, respirations 20, pulse  82, temp 97.  Blood sugars run 189 to 298.  HEART:  Regular rhythm.  LUNGS:  Clear to percussion and auscultation.  ABDOMEN:  No palpation organs or masses.   ASSESSMENT:  The patient was admitted with the above-stated problems.  __changed________ to soft, solid food.   PLAN:  Proceed with EGD at appropriate time.  Continue current regimen.      Angus G. Renard Matter, MD  Electronically Signed     AGM/MEDQ  D:  12/28/2006  T:  12/28/2006  Job:  [id number]

## 2011-05-01 NOTE — Discharge Summary (Signed)
NAMEJUDAEA, BURGOON                   ACCOUNT NO.:  [id number]   MEDICAL RECORD NO.:  [id number]          PATIENT TYPE:  INP   LOCATION:  A225                          FACILITY:  [facility]   PHYSICIAN:  Catalina Pizza, M.D.        DATE OF BIRTH:  30-Mar-1926   DATE OF ADMISSION:  04/12/2007  DATE OF DISCHARGE:  LH                               DISCHARGE SUMMARY   SUBJECTIVE:  Ms. Gravlin is a 77 year old African American female with  vertigo with significant nausea and vomiting.  She was seen by Dr  Gerilyn Pilgrim, and believes this is vestibular neuritis causing this.  The  patient states that she continues to have some dizziness, but middle to  no nausea at this time.  Discussed with the patient that she does have  signs of arterial occlusion and atherosclerotic-type changes on her MRA,  and she would benefit from being on medicine to help reduce risk for any  further progression, both including cholesterol medication as well as  medications such as Aggrenox.  Dr. Gerilyn Pilgrim agreed and started her on  Aggrenox daily.   PHYSICAL EXAMINATION:  VITAL SIGNS:  Temperature 96.0, blood pressure  140/66, pulse 70, respirations 18, 100% on 2 liters of oxygen. CBGs have  been 265, 379, 304, respectively.  GENERAL:  This is an obese African American female lying in bed in no  acute distress.  HEENT:  Unremarkable.  CHEST:  Clear to auscultation bilaterally.  HEART:  Regular rate and rhythm.  ABDOMEN:  Protuberant, soft, positive bowel sounds.  EXTREMITIES:  Trace lower extremity edema bilaterally which is chronic.  NEUROLOGICAL:  The patient's nystagmus does appear to be improved.  Moving all extremities.  No problems with weakness or slurred speech.   LABORATORY DATA:  B-met relatively normal except for glucose of 258, BUN  14, creatinine 1.09, CBC showed white count 7, hemoglobin 13.1, platelet  count 160.   IMPRESSION:  This is a 77 year old African American female with multiple  medical problems who has  vertigo, likely secondary to vestibular  neuritis.   ASSESSMENT/PLAN:  1. Vertigo with suspected vestibular neuritis.  Was seen by Dr.      Gerilyn Pilgrim and felt continue the Meclizine as previously prescribed      for the dizziness.  Has not had any more of the nausea.  States      this may last up to 3-4 days, routinely but has improved.  2. Right middle cerebral artery occlusion and atherosclerotic changes      in multiple other vessels, seen on MRA.  Will start her on Aggrenox      daily as prescribed by Dr. Gerilyn Pilgrim and then eventually go up to      twice a day.  This may cause headache and will need to monitor      closely for this.  3. Diabetes mellitus type 2.  Her blood pressures have continued to      trend up as her diet is returned back to normal.  Will continue on      the ADA  diet but increase her Lantus up to 50 units this time and      increase her sliding scale up to a moderate scale.  4. Glaucoma.  Continue with the pilocarpine.  5. Hypertension.  Her blood pressures have trended back down and will      resume on all of her medications.  6. Peripheral vascular disease/coronary artery disease.  Appears to be      stable at this time.  No complaints regarding this.   DISPOSITION:  If the patient does well through today, maybe I will  discharge tomorrow or at least over the weekend back to home.      Catalina Pizza, M.D.  Electronically Signed     ZH/MEDQ  D:  04/14/2007  T:  04/14/2007  Job:  [id number]

## 2011-05-01 NOTE — Group Therapy Note (Signed)
[name], [name]                   ACCOUNT NO.:  [id number]   MEDICAL RECORD NO.:  [id number]          PATIENT TYPE:  INP   LOCATION:  A213                          FACILITY:  [facility]   PHYSICIAN:  Angus G. Renard Matter, MD   DATE OF BIRTH:  [birth year]   DATE OF PROCEDURE:  06/14/2005  DATE OF DISCHARGE:                                   PROGRESS NOTE   SUBJECTIVE:  This patient was admitted with chief problem being episodes of  near syncope for approximately 1 week with left-sided weakness.  She is an  insulin-dependent diabetic, has had some difficulty voiding, did have  episode of hypoglycemia and she had another episode of hypoglycemia with  sugar 21.  Her other blood sugars have ranged from 154 to 246.   OBJECTIVE:  LUNGS:  Decreased breath sounds.  HEART:  Regular rhythm.  ABDOMEN:  No palpable organs or masses.   ASSESSMENT:  Near syncope, hypoglycemia.   PLAN:  Adjust the insulin dosage.  Continue current regimen.       AGM/MEDQ  D:  06/14/2005  T:  06/14/2005  Job:  [id number]

## 2011-05-01 NOTE — Group Therapy Note (Signed)
NAMELARANDA, BURKEMPER                   ACCOUNT NO.:  [id number]   MEDICAL RECORD NO.:  [id number]          PATIENT TYPE:  INP   LOCATION:  A225                          FACILITY:  [facility]   PHYSICIAN:  Angus G. Renard Matter, MD   DATE OF BIRTH:  02-10-1926   DATE OF PROCEDURE:  04/15/2007  DATE OF DISCHARGE:                                 PROGRESS NOTE   This patient was admitted with vertigo, nausea, and vomiting.  She was  felt to have a vestibular neuritis. She had an MRI of the brain which  showed extensive white matter changes, but nothing acute. Significant  multivessel intracranial occlusive disease. Symptomatic treatment was  recommended   OBJECTIVE:  VITAL SIGNS:  Blood pressure 153/69, respirations 24, pulse  86, temperature 97.1. Blood sugars have ranged from 250-323.  GENERAL:  Continues to have generalized pain.  HEART:  Regular rhythm.  LUNGS:  Clear to P&A.  ABDOMEN:  No palpable organs or masses.  NEUROLOGIC:  The patient does have nystagmus.   ASSESSMENT:  The patient admitted with vertigo, vestibular neuritis,  history of right middle cerebral artery occlusion, diabetes insulin  dependent, hypertension.   PLAN:  To continue current regimen.  Will attempt to get patient  discharged shortly.      Angus G. Renard Matter, MD  Electronically Signed     AGM/MEDQ  D:  04/15/2007  T:  04/15/2007  Job:  [id number]

## 2011-05-01 NOTE — Op Note (Signed)
[name], [name]                   ACCOUNT NO.:  [id number]   MEDICAL RECORD NO.:  [id number]          PATIENT TYPE:  AMB   LOCATION:  DAY                           FACILITY:  [facility]   PHYSICIAN:  Kassie Mends, M.D.      DATE OF BIRTH:  1926-08-24   DATE OF PROCEDURE:  04/06/2007  DATE OF DISCHARGE:                                PROCEDURE NOTE   REFERRING PHYSICIAN:  Kari Baars, M.D.   PROCEDURE:  Colonoscopy with jumbo foceps biopsy and cold forceps  polypectomy.   ENDOSCOPIST:  Kassie Mends, M.D.   INDICATION FOR EXAM:  [name] is a 77 year old female who had a  submucosal nodule seen on colonoscopy in January 2008.  She returns for  reevaluation of the submucosal nodule which was not able to be  visualized on a dedicated CT scan.  Biopsies showed no etiology for the  submucosal lesion.  She needed re-evaluation of the area prior to  consideration for surgical resection.   FINDINGS:  1. A 1.5 x 2-cm hepatic flexure submucosal nodule which is firm in      character. Biopsies obtained over this lesion using jumbo forceps.      The lesion is best seen in the retroflexed view.  The lesion is      easily localized due to prior [place] ink tattooing.  2. A 4-to 5-mm ascending colon polyp removed via cold forceps      polypectomy with the jumbo forceps.  3. Two sigmoid colon polyps which were approximately 3 mm in size      removed via cold forceps biopsies.  Otherwise, no masses,      inflammatory changes, diverticula or AVMs seen.  4. Normal retroflexed view of the rectum.   RECOMMENDATIONS:  1. Will see [name] in the office in 3 weeks to discuss further      management of her colon nodule and her biopsy results.  2. No aspirin, NSAIDs or anticoagulation for 7 days.  3. She may resume her previous diet.  4. Screening colonoscopy in 5 years if she remains healthy.   MEDICATIONS:  1. Demerol 75 mg IV.  2. Versed 6 mg IV.   PROCEDURE TECHNIQUE:  Physical exam was performed  and informed consent  was obtained from the patient after explaining the benefits, risks and  alternatives to the procedure.  The patient was connected to the monitor  and placed in the left lateral position.  Continuous oxygen was provided  by nasal cannula and IV medicine was administered through an indwelling  cannula.  After administration of sedation and rectal exam, the  patient's rectum  was intubated and the scope was advanced under direct visualization to  the cecum.  The scope was subsequently removed slowly by carefully  examining the color, texture, anatomy and integrity of the mucosa on the  way out.  The patient was recovered in endoscopy and discharged home in  satisfactory condition.      Kassie Mends, M.D.  Electronically Signed     SM/MEDQ  D:  04/06/2007  T:  04/06/2007  Job:  [id number]   cc:   Ramon Dredge L. Juanetta Gosling, M.D.  Fax: [phone]

## 2011-05-01 NOTE — Procedures (Signed)
[name], [name]                   ACCOUNT NO.:  [id number]   MEDICAL RECORD NO.:  [id number]          PATIENT TYPE:  INP   LOCATION:  A213                          FACILITY:  [facility]   PHYSICIAN:  Olga Millers, M.D. [facility] OF BIRTH:  04-02-1926   DATE OF PROCEDURE:  06/15/2005  DATE OF DISCHARGE:                                  ECHOCARDIOGRAM   PROCEDURE:  Transesophageal echocardiogram.   INDICATIONS:  The patient is a 76 year old female with hypertension and  diabetes mellitus.  She had a near-syncopal episode and this procedure is  performed to quantify left ventricular function.   STUDY:  It is technically adequate.  The two-dimensional measurements are as  follows:  1.  Left ventricular end diastolic dimension is 4.3 cm.  2.  Left ventricular end systolic dimension is 2.7 cm.  3.  Left atrium is 4.5 cm.  4.  The aorta is 2.8 cm.  5.  The septum is 2 cm.  6.  Posterior wall is 1.4 cm.   The overall left ventricular function is normal with an estimated ejection  fraction of 55%.  No focal wall motion abnormalities are appreciated.  There  is mild concentric left ventricular hypertrophy with mild to moderate  proximal septal thickening.  There was mild left atrial enlargement.  The  right atrium and right ventricle are normal.  There is no pericardial  effusion.   The aortic valve is trileaflet.  There is mild aortic sclerosis.  There is  no aortic stenosis or aortic insufficiency.  There was mild mitral annular  calcification.  There is mild mitral regurgitation.  There is trace  tricuspid regurgitation.   FINAL INTERPRETATION:  1.  Normal left ventricular function.  2.  Mild concentric left ventricular hypertrophy with proximal septal      thickening.  3.  Mild left atrial enlargement.  4.  Mild mitral regurgitation.       BC/MEDQ  D:  06/15/2005  T:  06/15/2005  Job:  [id number]   cc:   Angus G. Renard Matter, MD  [address]  [place]  [place] [address]  Fax:  [phone]

## 2011-05-01 NOTE — Assessment & Plan Note (Signed)
[facility]                              CARDIOLOGY OFFICE NOTE   NAME:[name], [name] SHERMAN                          MRN:          [id number]  DATE:11/08/2006                            DOB:          August 28, 1926    [name] returns today for further management of her lower extremity edema.  She also has nonobstructive coronary disease with a negative stress Myoview  March 29, 2006, history of severe peripheral vascular disease status post  multiple interventions and bilateral lower extremity surgeries, type 2  diabetes, diabetic neuropathy, hyperlipidemia, and obesity.   She also has nonobstructive carotid disease.  Recent Dopplers were stable.   MEDICATIONS:  1. Lantus 75 units a day.  2. Humalog as directed.  3. Enalapril 20 mg a day.  4. Aspirin 81 mg a day.  5. Allopurinol 100 mg a day.  6. Potassium 20 mEq a day.  7. Furosemide 20 mg a day.  8. Pilocarpine eye drops.  9. Travatan eye drops.  10.Cosopt eye drops.   EXAM:  She is sitting in a wheelchair.  She is very immobile.  Her blood pressure is 142/64, pulse is 82 and regular.  EKG is unremarkable,  except for a first degree AV block and LVH.  Her weight is stable at 228.  She is in no acute distress.  SKIN:  Warm and dry.  HEENT:  Normocephalic, atraumatic.  PERRLA.  Sclerae clear.  Facial symmetry  is normal.  Carotids are full with bilateral bruits.  There is no JVD.  Thyroid is not  enlarged.  Trachea is midline.  HEART:  Regular rate and rhythm without gallop.  ABDOMEN:  Soft with good bowel sounds.  EXTREMITIES:  Reveal 2+ pitting edema pretibially.  Pulses were present, but  markedly reduced.   ASSESSMENT AND PLAN:  [name] continues to have lower extremity edema.  I  have asked her to increase her Furosemide to 40 mg a day and double her  potassium for 2 weeks.  Hopefully, by then, we can cut back to her standard dose.  She will continue  with her other medical program.  I will  plan on seeing her back in 6 months.     Thomas C. Daleen Squibb, MD, [facility]  Electronically Signed    TCW/MedQ  DD: 11/08/2006  DT: 11/08/2006  Job #: [id number]   cc:   Angus G. Renard Matter, MD

## 2011-05-01 NOTE — Op Note (Signed)
[name]. [name]  Patient:    [name], [name] Visit Number: [id number] MRN: [id number]          Service Type: DSU Location: 5700 [id number] Attending Physician:  [name] Dictated by:   [name], D.D.S. Proc. Date: 06/20/02 Admit Date:  06/20/2002 Discharge Date: 06/20/2002   CC:         [name], M.D., [name], [place]   Operative Report  PREOPERATIVE DIAGNOSES:  1. Nonrestorable teeth, #4, 5, 6, 7, 8, 9, 10, 11, 19, 21, 22, 23, 24, 25,     26, 27, and 30.  2. History of poorly controlled diabetes.  3. History of hypertension.  4. History of peripheral vascular insufficiency.  POSTOPERATIVE DIAGNOSES:  1. Nonrestorable teeth, #4, 5, 6, 7, 8, 9, 10, 11, 19, 21, 22, 23, 24 25,     26, 27, and 30.  2. History of poorly controlled diabetes.  3. History of hypertension.  4. History of peripheral vascular insufficiency.  OPERATION/PROCEDURE: Total odontectomy.  SURGEON: [name], D.D.S.  FIRST ASSISTANT: Su Hilt.  ANESTHESIA: General via orotracheal intubation.  ESTIMATED BLOOD LOSS:  Less than 50 cc.  FLUID REPLACEMENT: Approximately 350 cc, crystalloid solution.  COMPLICATIONS: None apparent..  INDICATIONS FOR PROCEDURE: [name] is a 77 year old female who was referred to my office for removal of her remaining teeth.  The patient has suffered chronically from chronic periodontal disease and dental caries.  Due to the number of teeth to be removed, and the patients medical history, it was recommended that the teeth be removed in an operating room setting where the patient could be closely monitored.  DETAILS OF PROCEDURE: On June 20, 2002 [name] was taken to the [name]. [name] main operating suite, where she was placed on the operating room table in a supine position.  Following successful orotracheal intubation and general anesthesia the patients face, neck, and oral cavity were  prepped and draped in the usual sterile operating room fashion.  The hypopharynx was suctioned free of fluids and secretions and a moistened two inch vaginal pack was placed as a throat pack.  Attention was then directed intraorally, where approximately 8 cc of 0.5% Xylocaine containing 1:200,000 epinephrine was infiltrated in the maxillary buccal and palatal soft tissues and the right and left if needed alveolar neurovascular regions.  Attention was then directed toward the maxillary arch, where a #9 periosteal elevator was used to reflect at full-thickness mucoperiosteal flap around the buccal and lingual surfaces of the dentition.  The teeth were then subluxated from the alveolus using an 11-A elevator and removed from the alveolus using a 15 dental forceps. Minimal alveoplasty was then performed using a small osseous file.  The surgical areas were then copiously irrigated with sterile saline irrigating solutions, and suctioned.  The mucoperiosteal margins were then approximated and sutured in an interrupted fashion using 4-0 chromic suture material on an FS-2 needle.  In similar fashion, the mandibular teeth were removed and alveoloplasty performed.  The oral cavity was then thoroughly irrigated and suctioned.  The throat pack was removed and the hypopharynx suctioned free of fluids and secretions.  The patient was then allowed to awaken from the anesthesia and taken to the recovery room, where she tolerated the procedure nicely and without apparent complications. Dictated by:   [name], D.D.S. Attending Physician:  [name] DD:  06/20/02 TD:  06/23/02 Job: [id number]

## 2011-05-01 NOTE — H&P (Signed)
. [facility]  Patient:    [name], [name] Visit Number: [id number] MRN: [id number]          Service Type: SUR Location: 3300 3313 [id number] Attending Physician:  Bennye Alm Dictated by:   Dominica Severin, P.A. Admit Date:  04/14/2002 Discharge Date: 04/15/2002   CC:         Butch Penny, M.D.  Thomas C. Wall, M.D. [facility]  CVTS Office   History and Physical  DATE OF BIRTH:  [birth year]  CHIEF COMPLAINT:  Vein graft stenosis of left femoral to tibial bypass graft.  HISTORY OF PRESENT ILLNESS:  This is a 77 year old African-American female with a known history of peripheral vascular occlusive disease, who is status post bilateral peripheral bypasses in 1996 on the right and in 2000 on the left.  She had been having regular follow-ups of her peripheral vascular occlusive disease at her office per protocol.  She had been doing well, although her last arterial brachial indices showed a decrease in her left to 83% from 100%, which suggested possible vein graft stenosis on the left.  Her right lower extremity was stable at 97%.  The patient had an angiogram done on April 10, 2002, to evaluate this further by Di Kindle. Edilia Bo, M.D., which confirmed the diagnosis and recommended to proceed with revision of her vein graft stenosis repair and revision of the left femoral to tibial bypass graft to be done on Apr 14, 2002.  The patient has a history of chronic back pain.  Therefore, she does have a history of buttock, hip, thigh, calf, and foot pain.  She describes having pain at night before she goes to bed, sometimes having trouble going to sleep.  Denies any slow-healing ulcers since the previous ulcer in her toe, which has since healed after her surgeries. Denies any history of gangrene in her lower extremities.  Denies any ischemic changes.  She occasionally has some peripheral edema in her feet.  She does notice occasional decreased temperature.   She states she is short of breath after walking 100 feet.  She denies any chest pain.  She has not had any since last summer.  Her cardiologist is Maisie Fus C. Wall, M.D.  She does state that she has a history of palpitations in the past, but is not currently being treated for any arrhythmias.  PAST MEDICAL HISTORY:  1. Bilateral peripheral vascular occlusive disease.  2. Hypertension.  3. Coronary artery disease with a history of myocardial infarction.  4. Insulin-dependent diabetes mellitus.  5. History of left breast cancer, status post mastectomy and chemotherapy.  6. Obesity.  7. Diabetic nephropathy.  8. Chronic back pain.  9. Dyspepsia. 10. Chronic nausea and vomiting. 11. Psychiatric history with a history of nervous breakdown. 12. Degenerative joint disease. 13. History of osteomyelitis of the left third finger, status post amputation     of distal phalanx. 14. History of UTIs and incontinence. 15. History of mediastinal adenopathy, status post mediastinoscopy which was     benign.  PAST SURGICAL HISTORY:  1. Status post angiogram on April 10, 2002.  2. On March 27, 1999, left femoral to posterior tibial bypass graft by     Di Kindle. Edilia Bo, M.D.  3. On March 28, 1999, thrombectomy of the left femoral to posterior tibial     bypass graft with vein patch angioplasty of the femoral to femorotibial.  4. Bilateral cataract excisions.  5. Mastectomy, left, in 1995.  6. History of detached retina,  status post repair x 3, one on the right and     two on the left.  7. Tonsillectomy and adenoidectomy.  8. Partial hysterectomy.  9. Umbilical hernia repair. 10. Appendectomy. 11. History of cystoscopy with dilatation in 1972 and in October of 2002 by     Dr. ______ 12. Mediastinoscopy with lymph node biopsies and esophageal dilatation. 13. Amputation of the distal end of the left third digit in 1997.  MEDICATIONS: 1. Lantus 65 units q.h.s. subcu. 2. Humalog sliding scale  insulin p.r.n. 3. Cosopt one drop to each eye q.12h. 4. Travatan one drop to each eye q.p.m. 5. Hydrochlorothiazide 25 mg one tablet p.o. q.d. 6. Lipitor 10 mg one tablet p.o. q.h.s. 7. Vasotec 20 mg one tablet q.d.  ALLERGIES:  The patient states that she is allergic to PENICILLIN which causes a rash and SULFA which causes GI upset.  She is not to take ASPIRIN secondary to her history of detached retina repair.  CODEINE causes GI upset.  REVIEW OF SYSTEMS:  Please see HPI for significant positives.  Otherwise the patient denies any recent fever, chills, weight loss, weight gain, or fatigue. She denies any blurred vision recently or any eyes ears, nose, and throat problems or any dysphagia.  She denies any productive cough or paroxysmal nocturnal dyspnea.  Denies any recent chest pain or arrhythmias.  Denies any GI bleeding or trouble with constipation or diarrhea.  Denies any history of colon cancer.  Denies any history of asthma.  Denies any history of strokes. She states that she has a questionable history of a TIA in the past, but nothing has been confirmed.  She has poorly controlled diabetes mellitus and states that she gets hypoglycemic when her blood sugars are in the 100s. Since her medication has been changed to Lantus, her blood sugars have been running between 200-300.  FAMILY HISTORY:  Her mother is deceased at 33 from emphysema.  Her father is deceased at 59 from emphysema and seizures.  She has other family history of gynecological cancer, diabetes, and COPD.  A brother is deceased at 35 years old from a massive MI, as well as asthma and bronchitis.  SOCIAL HISTORY:  The patient is married for 56 years.  He has three children. He is retired.  He used to work at [employer] years ago.  Denies any alcohol use.  Denies any history of tobacco use.   PHYSICAL EXAMINATION:  VITAL SIGNS:  The blood pressure is 146/82, pulse 80, and respirations 14.  GENERAL  APPEARANCE:  This is a 77 year old African-American female who is in no acute distress.  She is alert and oriented x 3.  HEENT:  The head is normocephalic and atraumatic.  The eyes are PERRLA. Extraocular movements are intact, although decreased.  She does not have any indication of cataracts, glaucoma, or macular degeneration.  NECK:  Supple without JVD, bruits, or lymphadenopathy.  CHEST:  Symmetrical on inspiration.  This demonstrates a well-healed mediastinoscopy scar.  LUNGS:  Without rhonchi, rales, or wheezes.  HEART:  Regular rate and rhythm without murmurs, rubs, or gallops.  ABDOMEN:  Soft and nontender with positive bowel sounds x 4.  Without masses or bruits.  GENITOURINARY:  Deferred.  RECTAL:  Deferred.  EXTREMITIES:  Without clubbing, cyanosis, edema, or ulcerations.  Her left hand demonstrates a distal amputation of her third digit, which is secondary to gangrene and osteomyelitis.  She has well-healed lower extremity incisions from previous bypass graft surgeries on both  lower extremities.  Her temperature is warm.  She has 2+ carotid and 2+ femoral pulses bilaterally. There is a palpable graft pulse on her left and right.  NEUROLOGIC:  There is no focal deficit.  She has a steady gait.  Deep tendon reflexes are 2+.  Muscle strength is 2+ bilaterally.  ASSESSMENT AND PLAN:  Left femoropopliteal occlusive disease for revision of left femorotibial bypass graft on Apr 14, 2002, as well as repair of vein graft stenosis.  Di Kindle. Edilia Bo, M.D., has seen and evaluated the patient prior to admission and has explained the risks and benefits of the procedure and the patient has agreed to continue. Dictated by:   Dominica Severin, P.A. Attending Physician:  Bennye Alm DD:  04/12/02 TD:  04/12/02 Job: [id number]

## 2011-05-01 NOTE — H&P (Signed)
NAME:  [name], [name]                             ACCOUNT NO.:  [id number]   MEDICAL RECORD NO.:  [id number]                   PATIENT TYPE:  INP   LOCATION:  IC07                                 FACILITY:  [facility]   PHYSICIAN:  Mila Homer. Sudie Bailey, M.D.           DATE OF BIRTH:  10/12/1926   DATE OF ADMISSION:  04/27/2004  DATE OF DISCHARGE:                                HISTORY & PHYSICAL   HISTORY OF PRESENT ILLNESS:  This 77 year old developed pain in the chest  and discomfort the night of admission.  She presented to the emergency room.   CURRENT MEDICATIONS:  1. ECASA 81 mg daily.  2. Dilaudid 2 mg p.r.n.  3. HCTZ 25 mg.  4. Triamterene 37.5 mg daily.  5. Lipitor 10 mg daily.   ALLERGIES:  She is allergic to:  1. PENICILLIN.  2. SULFA.  3. CODEINE.   PAST MEDICAL HISTORY:  1. Insulin-dependent diabetes.  2. MI.  3. Neuropathy.  4. Hypertension.  5. Peripheral vascular disease.  She had bilateral peripheral bypasses in     1996 on the right and in 2000 on the left and then repair of both of     these due to restenosis in 2003.  6. Apparently angioplasty.  7. Chronic back pain.  8. She said that she had a cardiac catheterization in 1985 by Dr. Corinda [name]     and then three years ago she had what sounds like an event recorder on     for about a month and had a stress also as well by [name].   SOCIAL HISTORY:  Her husband was quite sick and died three months ago.   PHYSICAL EXAMINATION:  VITAL SIGNS:  She presented to the emergency room  with a temperature of 98.8 degrees and a blood pressure of 190/85.  The BP  gradually came down to 128/60.  The pulse was 79, respiratory rate 24, and  O2 saturation 99-100%.  GENERAL APPEARANCE:  She appeared alert and oriented.  At the time I saw  her, she was doing much better on medications, really symptom-free.  Color  looked good.  HEENT:  Mucous membranes moist.  SKIN:  Turgor normal.  HEART:  Regular rate and rhythm at  about 70.  LUNGS:  Appeared clear.  Moving air well.  ABDOMEN:  Soft and obese without hepatosplenomegaly or mass.  EXTREMITIES:  There was trace edema of the ankles.   LABORATORY TESTS:  The admission CBC was normal.  The MET-7 showed a glucose  of 225.  Her admission CK was 268, MB 8.5, and troponin 0.7.  The PT, PTT,  and INR were normal.  Her beta natruretic peptide was less than 30.  Her D-  dimer, however, was 0.63.  Her repeat cardiac panel showed a CK of 226, an  MB of 6.3, and troponin 0.06.   The EKG showed a  sinus rhythm and the rate was 83.   ADMISSION DIAGNOSES:  1. Acute coronary syndrome.  2. History of myocardial infarction.  3. Peripheral vascular disease, status post bypass to both the left and the     right lower extremities and then reangioplasty for stenosis of both of     these vessels in 2003.  4. Insulin-dependent diabetes mellitus.  5. Obesity.  6. Hypercholesterolemia.  7. Essential hypertension.  8. Status post hysterectomy.  9. Peripheral neuropathy.   PLAN:  Plan of treatment in the hospital includes transfer to [name] to see cardiology as soon as possible.  This was attempted through  our ER, but the cardiac beds were all full.  So she is being treated here  with a heparin drip, a 2 g sodium diet, ECASA 81 mg daily, Dilaudid 2 mg  p.o. p.r.n., HCTZ 25 mg daily, Lipitor 10 mg daily, Lantus 10 mg  subcutaneously daily, Xalatan and Cosopt drips, and Relafen 750 mg b.i.d.  [place] form filled out.     ___________________________________________                                         Mila Homer. Sudie Bailey, M.D.   SDK/MEDQ  D:  04/27/2004  T:  04/27/2004  Job:  [id number]

## 2011-05-01 NOTE — Consult Note (Signed)
[name]                   ACCOUNT NO.:  [id number]   MEDICAL RECORD NO.:  [id number]          PATIENT TYPE:  INP   LOCATION:  A314                          FACILITY:  [facility]   PHYSICIAN:  Kassie Mends, M.D.      DATE OF BIRTH:  23-Apr-1926   DATE OF CONSULTATION:  12/27/2006  DATE OF DISCHARGE:                                 CONSULTATION   REASON FOR CONSULTATION:  Abdominal pain, dysphagia.   HPI:  [name] is a 77 year old female.  She awakened yesterday morning  with severe epigastric pain which radiated to her back.  She describes  the pain as 10/10 pain scale.  She describes the pain as intermittent  gas pains.  She also complains of dysphagia that has lasted several  weeks now.  She feels like food gets stuck in her mid esophagus.  She  has a history of esophageal dysphagia with EGD in the past 4-5 years.  She complains of daily heartburn and indigestion as well as water brash.  She denies any regurgitation, nausea or vomiting.  She complains of  abdominal bloating with severe constipation, usually has a bowel  movement every 5-6 days but does have to take mag citrate to initiate a  bowel movement.  She has noted fecal seepage as well.  She complains of  anorexia.  She is not on PPI currently.  She has a CT scan pending.   PAST MEDICAL SURGICAL HISTORY:  1. Diabetes mellitus.  2. Coronary artery disease status post MI.  3. Peripheral vascular disease.  4. Status post right fem pop bypass grafting.  5. Hypertension.  6. Left breast carcinoma status post mastectomy  7. Hysterectomy.  8. Glaucoma.  9. Detached retina.  10.Multiple cystoscopies with urethral dilatations.  11.Neuropathy.  12.Degenerative disk disease.  13.Left third digit osteomyelitis, status post amputation.  14.Tonsillectomy and adenoidectomy.  15.Umbilical hernia repair.  16.Asymptomatic cholelithiasis.  17.EGD in 1999 with ED which showed gastritis and erosive reflux      esophagitis.  18.Colonoscopy in 1992 which showed a small lipoma in the transverse      colon and rectocele.   MEDICATIONS PRIOR TO ADMISSION:  1. Vasotec 20 mg daily.  2. Lasix 20 mg daily.  3. KCL 10 mEq daily.  4. Lantus 75 units subcu in the morning.  5. Humalog sliding scale p.r.n.  6. Pilocarpine one drop q.4 hours.  7. Cosopt I one drop q.12 hours.   ALLERGIES:  1. PENICILLIN CAUSES RASH.  2. SULFA CAUSES GI UPSET.  3. PATIENT DOES NOT TOLERATE MULTIPLE NARCOTIC PAIN MEDS INCLUDING      CODEINE, TYLOX, PERCOCET, OXYCODONE AND MORPHINE.   FAMILY HISTORY:  Mother deceased in her 61's secondary to COPD.  Father  deceased in his 22's secondary to COPD and seizures.  She lost one  brother due to coronary artery disease, one sister as an infant.   SOCIAL HISTORY:  She is retired from [employer].  Denies any  tobacco, alcohol or drug use.  She is a widow.  She lives with her son.  She has 3 healthy children.   REVIEW OF SYSTEMS:  CONSTITUTIONAL:  Weight is stable.  Denies any fever  but does complain of chills.  CARDIOVASCULAR:  Denies any chest pain or  palpitations.  PULMONARY:  No shortness of breath, dyspnea, cough,  hemoptysis.  GI:  See HPI.  GU:  Denies any dysuria.  She does have  increased urinary frequency.  Has had multiple studies done by Dr.  Rito Ehrlich.   PHYSICAL EXAM:  VITAL SIGNS:  Temp 98.1, pulse 76, respirations 20,  blood pressure 136/68.  GENERAL:  [name] is an alert and oriented African-American female in no  acute distress.  She is obese.  HEENT:  Sclera clear, nonicteric, conjunctiva pink.  Oropharynx pink and  moist without any lesions.  She is edentulous.  NECK:  Supple without masses or thyromegaly.  CHEST/HEART:  Regular rate and rhythm, normal S1, S2.  LUNGS:  Clear to auscultation bilaterally.  ABDOMEN:  Positive bowel sounds x4.  No bruits auscultated.  Soft.  She  does have mild distention.  She also has a firm tenderness and  protrusion, possible  mass to the right mid abdomen just adjacent to the  umbilicus.  There is no rebound tenderness or guarding, no  hepatosplenomegaly.  EXTREMITIES:  Without clubbing or edema bilaterally.   LABORATORY STUDIES:  From January 13:  WBCs 5.8, hemoglobin 13.4,  hematocrit 40.2 and platelets 194,000.  Calcium 9.4, sodium 139,  potassium 4.3, chloride 104, CO2 30, BUN 15, creatinine 1.08, glucose  163, total bilirubin 1, direct 0.2, indirect 0.8, alkaline phosphatase  71, AST 16, ALT 14, total protein 5.9, albumin 2.9, amylase 55, lipase  20, myoglobin 233.   IMPRESSION:  [name] is a 77 year old female with severe epigastric  pain which radiates to her mid back and entire abdomen, began yesterday  morning.  She also complains of daily heartburn, indigestion and  dysphagia.  She has a history of chronic severe constipation.  She has a  CT pending, done yesterday, known history of cholelithiasis.  She has a  palpable right protrusion, possible mass in the right mid abdomen  adjacent to the umbilicus.   PLAN:  1. Review CT results with radiologist.  2. If negative, plan for EGD in the ED and eventual colonoscopy for      evaluation of symptoms.  I suspect erosive esophagitis with      complicated GERD with web, ring or stricture, but this does not      explain her abdominal pain      and lesion found on exam.  3. Agree with PPI.   We would like to thank Dr. Juanetta Gosling for allowing [place] to participate in the  care of [name].      Nicholas Lose, N.P.      Kassie Mends, M.D.  Electronically Signed    KC/MEDQ  D:  12/27/2006  T:  12/27/2006  Job:  [id number]   cc:   Ramon Dredge L. Juanetta Gosling, M.D.  Fax: [phone]

## 2011-05-01 NOTE — H&P (Signed)
[name], [name]                   ACCOUNT NO.:  [id number]   MEDICAL RECORD NO.:  [id number]          PATIENT TYPE:  INP   LOCATION:  A213                          FACILITY:  [facility]   PHYSICIAN:  Angus G. Renard Matter, MD   DATE OF BIRTH:  Jul 13, 1926   DATE OF ADMISSION:  06/12/2005  DATE OF DISCHARGE:  LH                                HISTORY & PHYSICAL   A 77 year old Afro-American female admitted with chief problem being  episodes of near syncope for approximately one week with left sided  weakness.  The patient states that at times she is aware of questions asked  of her, but she can not respond.  Does not relate this to a low blood sugar,  although she is an insulin-dependent diabetic.  She is admitted for further  evaluation.   SOCIAL HISTORY:  The patient does not smoke or drink alcohol.   PAST MEDICAL HISTORY:   SURGERY:  1.  The patient has had a mastectomy on the left for breast cancer.  2.  History of hysterectomy.  3.  Peripheral vascular insufficiency of the legs.  4.  Amputated finger on the left hand.  5.  Has had lymph node biopsy previously.   MEDICAL ILLNESSES:  1.  The patient does have insulin-dependent diabetes.  2.  Has had a detached retina.  3.  Does have previous MI.  4.  Urethral stenosis.   ALLERGIES:  1.  PENICILLIN.  2.  SULFA.  3.  CODEINE.   1.  Humalog 5 units daily p.r.n.  2.  Vasotec 10 mg daily.  3.  Lipitor 10 mg daily.  4.  Aspirin 81 mg.  5.  Lyrica 75 mg b.i.d.   REVIEW OF SYSTEMS:  HEENT:  Negative.  CARDIOPULMONARY:  The patient has had  __________  slight dysuria, difficulty urinating.  The patient has to strain  to urinate.   PHYSICAL EXAMINATION:  VITAL SIGNS:  Her blood pressure 130/80, pulse 60,  respirations 18.  HEENT:  Eyes, PERRLA.  TMs negative.  Oropharynx benign.  NECK:  Supple.  No JVD or thyroid abnormalities.  HEART:  Regular rhythm.  No murmurs.  No cardiomegaly.  LUNGS:  Clear to P&A.  BREASTS:  Right breast  no masses.  The patient has a left mastectomy scar.  ABDOMEN:  No palpable organs or masses.  No hepatosplenomegaly.   1.  Insulin-dependent diabetes.  2.  History of coronary artery disease.  3.  Glaucoma.  4.  Urethral stenosis.  5.  Dyslipidemia.       AGM/MEDQ  D:  06/12/2005  T:  06/12/2005  Job:  [id number]

## 2011-05-01 NOTE — Group Therapy Note (Signed)
NAMEMAUDRY, ZEIDAN                   ACCOUNT NO.:  [id number]   MEDICAL RECORD NO.:  [id number]          PATIENT TYPE:  INP   LOCATION:  A314                          FACILITY:  [facility]   PHYSICIAN:  Angus G. Renard Matter, MD   DATE OF BIRTH:  [birth year]   DATE OF PROCEDURE:  12/27/2006  DATE OF DISCHARGE:                                 PROGRESS NOTE   SUBJECTIVE:  This patient was admitted with abdominal pain and  dysphagia. She does have additional history of hypertension, coronary  artery disease, fem-pop bypass surgery to the legs, coronary artery  disease, gastroesophageal reflux disease, and dysphagia.   OBJECTIVE:  VITAL SIGNS:  Blood pressure 125/65, respirations 20, pulse  82, temperature 98.1. Blood sugars have ranged from 161-220. Chemistries  are otherwise negative.  GENERAL:  The patient has generalized weakness.  HEART:  Regular rhythm.  ABDOMEN:  No palpable organs or masses.  LUNGS:  Clear.   ASSESSMENT:  The patient was admitted with dysphagia and abdominal pain.  She does have an underlying problem with coronary artery disease,  previous peripheral vascular disease, and hypertension.   PLAN:  To proceed with and continue with current medications. To obtain  GI consult.      Angus G. Renard Matter, MD  Electronically Signed     AGM/MEDQ  D:  12/27/2006  T:  12/27/2006  Job:  [id number]

## 2011-05-01 NOTE — Consult Note (Signed)
NAME:  [name], [name]                   ACCOUNT NO.:  [id number]   MEDICAL RECORD NO.:  [id number]          PATIENT TYPE:  INP   LOCATION:  A225                          FACILITY:  [facility]   PHYSICIAN:  Kofi A. Gerilyn Pilgrim, M.D. DATE OF BIRTH:  1926-06-20   DATE OF CONSULTATION:  04/14/2007  DATE OF DISCHARGE:                                 CONSULTATION   REASON FOR CONSULTATION:  Dizziness.   HISTORY OF PRESENT ILLNESS:  This is a 77 year old black female who  presents with vertiginous symptoms.  She indicated that about a week or  more ago she had dizziness described as lightheadedness, spinning, and  the sensation as if she is being thrown backwards.  She reports that  this spell lasted for a brief time, albeit several minutes, but  resolved.  She had another event yesterday which lasted several hours  and was associated with an elevated blood pressure of 200.  She had  intense nausea and vomiting with these event.  She denies any focal  neurological symptoms such as dysarthria, diplopia, hemiparesis or  hemisensory symptoms.  The patient complains of having left ear  discomfort described as fullness or something crawling in her ear.  She  apparently had her primary care physician take a look at it 3 weeks ago  and this was fine, but she apparently continues to have this recurrent  complaint.  The patient was recently scoped and had a polypectomy with  was taken off antiplatelet agents.  She has had some problems tolerating  Lipitor, and this was discontinued.   ALLERGIES:  1. CODEINE.  2. PENICILLIN.  3. SULFA.   PAST MEDICAL HISTORY:  1. Left breast cancer, status post left radical mastectomy.  2. Type 2 diabetes.  3. Helicobacter pylori infection.  4. Coronary artery disease.  5. Peripheral arterial disease status post right femoral-popliteal      bypass.  6. Hypertension.  7. Glaucoma with detached retina and so does have poor vision.  8. Multiple urinary tract infections.  9. Degenerative joint disease.  10.Tonsillectomy and adenoidectomy.  11.Gallstones.  12.Peripheral neuropathy.   REVIEW OF SYSTEMS:  As seen in the history of present illness.  She does  have some numbness in the distal legs and feet.  She also has had  problems ambulating due to the leg weakness.   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  No alcohol or tobacco use.  She has several children who are very  supportive.   PHYSICAL EXAMINATION:  GENERAL:  A pleasant, mildly overweight lady in  no acute distress.  VITAL SIGNS:  Temperature 97.2, pulse 70, respirations 18, blood  pressure 148/66.  HEENT:  Neck is supple.  Head is normocephalic and atraumatic.  ABDOMEN:  Obese but soft.  EXTREMITIES:  No significant edema noted at this time.  NEUROLOGIC:  Mentation - the patient is awake, alert.  She could easily  recognize me from previous encounters.  She is lucid and follows  commands well.  No dysarthria or aphagia is noted.  Cranial nerve  evaluation - both pupils are 5 mm and iridectomized.  They are  unresponsive to light.  She does have poor vision, especially  peripherally.  No clear visual field cuts are observed.  She does have a  left exotropia, but has full extraocular movements.  No nystagmus is  noted.  Facial muscle strength is symmetric.  Tongue is midline.  Uvula  is midline.  Shoulder shrug is normal.  Motor examination shows normal  tone, bulk and strength.  Reflexes have markedly diminished reaction in  the legs and diminished in the upper extremities.  Plantar reflexes are  both downgoing.  Sensation shows significantly impaired sensation to  light touch and temperature involving the feet and distal legs,  especially on the left side.  Coordination shows no dysmetria.  There is  no past pointing or ataxia noted.  No tremors or parkinsonism noted.   MRI of the brain shows extensive white matter chronic changes.  Nothing  acute is  noted, however; particularly nothing in the posterior fossa.  She does have significant findings on MRA with occluded right MCA,  attenuation and stenosis of the distal branches of the PCA.  There is  also stenosis of the left MCA.   IMPRESSION:  1. Vertiginous symptoms with intense nausea, vomiting and nonfocal      examination and negative imaging for acute infarction.  The most      likely explanation is from acute vestibuloneuronitis.  2. Significant multivessel intracranial occlusive disease.  3. Hypertension.  4. Diabetes.  5. Dyslipidemia.   RECOMMENDATIONS:  1. Continue with symptomatic treatment for the patient including gait      and treatment for nausea and vomiting.  2. The patient is to be restarted on antiplatelet agents.  Given      extensive disease, will go ahead and actually increase her to      Aggrenox once a day for 5 days and then b.i.d. dosing afterwards.  3. I think she should be on some statin to help treat her multivessel      intracranial stenosis.  At this point in time, I would not treat      her with intervention, as she is relatively asymptomatic.   Thank you for the consultation.      Kofi A. Gerilyn Pilgrim, M.D.  Electronically Signed     KAD/MEDQ  D:  04/14/2007  T:  04/14/2007  Job:  [id number]

## 2011-05-01 NOTE — Discharge Summary (Signed)
NAME:  [name], [name]                             ACCOUNT NO.:  [id number]   MEDICAL RECORD NO.:  [id number]                   PATIENT TYPE:  INP   LOCATION:  3735                                 FACILITY:  [facility]   PHYSICIAN:  Thomas C. Wall, M.D.                DATE OF BIRTH:  11-06-1926   DATE OF ADMISSION:  04/27/2004  DATE OF DISCHARGE:  04/30/2004                           DISCHARGE SUMMARY - REFERRING   SUMMARY OF HISTORY:  Ms. Norris is a 77 year old African-American female who  presented on May 14 at approximately 11 p.m. when she awoke in a panic and  stated that she was kicking her legs.  She developed some shortness of  breath and chest discomfort along with palpitations.  These symptoms  resolved, and she returned to sleep; however, she awoke again with similar  symptoms.  She called her daughter who drove her to [facility].  Since presenting to [facility], she has not had any further chest  discomfort.  However, given her history, they transferred her to [facility] for  further evaluation.  The patient noted that the discomfort was going to her  neck, described as moderate in intensity, with shortness of breath which was  what concerned her.   Her history is notable for a cardiac catheterization in 1985 with  nonobstructive LAD disease.  She has a history of diabetes, hypertension,  hyperlipidemia, peripheral vascular disease, and left femoral-tibial bypass,  chronic back pain, neuropathy, hysterectomy, breast cancer status post left  mastectomy, and chemotherapy.  She also probably had some recent depression  secondary to the recent death of her husband.   LABORATORY AND X-RAY DATA:  Chest x-ray on May 15 shows some cardiomegaly,  mild right basilar atelectasis.   Cardiolite imaging showed an EF of 57%, no ischemia or evidence of scarring.   At [facility], hemoglobin was 13.8, hematocrit 39.5, normal  indices, platelets 178, WBC 6.5.  Subsequent  hematology did not show any  change.  Admission PT was 13.2, PTT 33.  Sodium 136, potassium 3.7, BUN 20,  creatinine 1.3, glucose 225.  Subsequent chemistries were not performed.  Admission total CK-MB was 268 with 8.5, relative index 3.2, and troponin  0.07.  Second set at [facility] showed CK total MB 226 and 6.3 with a  relative index of 2.8 and troponin 0.6.  Subsequent CK-MBs and troponin at  [facility] were declining in nature.  Total cholesterol 142,  triglycerides 98, HDL 48, LDL 74.   EKG showed normal sinus rhythm, left axis deviation, delayed R waves,  nonspecific ST-T wave changes.   HOSPITAL COURSE:  She was transferred from [facility] to [facility] for further cardiac evaluation.  She was admitted initially by Dr.  __________, a [facility] fellow covering our practice.  It was felt that she  should undergo Cardiolite testing to  evaluate her discomfort.  This was  ordered by Dr. Myrtis Ser on May 16.  Office records were also obtained for  clarification.  As noted, on May 16  adenosine Cardiolite was performed  without difficulty.  Imaging was unremarkable as previously mentioned.  Findings were discussed with Dr. Tenny Craw and the patient. Dr. Tenny Craw felt, with  her elevated CK, total MB, relative index, troponin, and her history of  nonobstructive coronary artery disease by catheterization in 1985 with  multiple risk factors, she should remain overnight and testing reviewed with  Wall and possible consideration of cardiac catheterization.  The patient was  agreeable to this.   On May 17,  Dr. Daleen Squibb saw the patient.  The patient expressed continued  shortness of breath and that she felt very depressed.  Dr. Daleen Squibb added Imdur  to her medical regimen and discontinued heparin.  He felt she should follow  up with Dr. Renard Matter in regards to her depression. Cardiac rehab assisted  with eructation and ambulation.  On May 18, Dr. Daleen Squibb felt the patient could  be discharged  home with the diagnosis of non-Q-wave myocardial infarction  and depression, history as previously.   DISPOSITION:  Dr. Daleen Squibb filled out the pink sheet.  Her medications are as  follows.   DISCHARGE MEDICATIONS:  1. Aspirin 81 mg daily.  2. Trusopt eye drops.  3. Insulin as previously.  4. Lipitor 10 mg q.h.s.  5. Triamterene/HCTZ 3.75/25 mg daily.  6. Imdur 30 mg daily.  7. Vasotec daily.  8. Lipitor.   DIET:  She was asked to maintain low-fat, low-salt, and low-cholesterol ADA  diet.   FOLLOW UP:  She will arrange a followup appointment with Dr. Renard Matter, and  she will see Dr. Daleen Squibb in [place] office on Wednesday, May 14, 2004, at  1:45 p.m.      Joellyn Rued, P.A. [facility]                    Thomas C. Wall, M.D.    EW/MEDQ  D:  04/30/2004  T:  04/30/2004  Job:  [id number]   cc:   Jesse Sans. Wall, M.D.   Angus G. Renard Matter, M.D.  [address]  [place]  [place] [address]  Fax: [phone]

## 2011-05-01 NOTE — Op Note (Signed)
[place]  Patient:    KEYA, WYNES Visit Number: [id number] MRN: [id number]          Service Type: DSU Location: [facility] 2876 [id number] Attending Physician:  Bennye Alm Dictated by:   Di Kindle Edilia Bo, M.D. Proc. Date: 04/10/02 Admit Date:  04/10/2002 Discharge Date: 04/10/2002   CC:         Peripheral Vascular Lab  Butch Penny, M.D.   Operative Report  PREOPERATIVE DIAGNOSIS:  Vein graft stenosis of the left femoral-posterior tibial bypass graft.  POSTOPERATIVE DIAGNOSIS:  Vein graft stenosis of the left femoral-posterior tibial bypass graft.  OPERATION PERFORMED: 1. Aortogram. 2. Bilateral iliac arteriogram. 3. Bilateral lower extremity run-off.  SURGEON:  Di Kindle. Edilia Bo, M.D.  ANESTHESIA:  Local with sedation.  INDICATIONS FOR PROCEDURE:  The patient is a 77 year old woman who has undergone a previous right proximal superficial femoral artery to peroneal artery bypass graft by Dr. Hart Rochester in 1996.  She has subsequently had a left femoral to posterior tibial artery bypass graft by myself in 2000.  On a routine follow-up protocol, she was found to have an area of vein graft stenosis in her bypass graft on the left leg below the knee.  She was brought in for diagnostic arteriography.  DESCRIPTION OF PROCEDURE:  The patient was taken to the cath lab and sedated with 1 mg of Versed and 50 mcg of fentanyl.  Both groins were prepped and draped in the usual sterile fashion.  After the skin was anesthetized with 1% lidocaine, the right common femoral artery was cannulated and a guide wire introduced into the infrarenal aorta under fluoroscopic control.  A pigtail catheter was then passed over the wire and a flush aortogram obtained.  The catheter was repositioned above the aortic bifurcation and iliac arteriogram obtained.  The pigtail catheter was then exchanged for an IMA catheter which was positioned into the  proximal left common iliac artery and then an angled glide wire was passed down into the common femoral artery and superficial femoral artery.  I then exchanged the IMA catheter for an end-hole catheter an d positioned this in the common femoral artery above the bifurcation.  Left lower extremity run-off film was obtained.  Then the end-hole catheter was removed and right lower extremity run-off films were obtained through the right femoral feed.  FINDINGS:  There are two renal arteries on both sides with no significant renal artery stenosis identified.  There is no significant aortoiliac occlusive disease.  The inferior mesenteric artery is patent.  Both common iliac, external iliac and hypogastric arteries were widely patent bilaterally.  On the left side the vein graft appears to originate from the superficial femoral artery.  There is significant stenosis in the superficial femoral artery just below the origin of the vein graft.  The proximal superficial femoral artery is patent with no significant stenosis identified.  The deep femoral artery is patent.  There is no stenosis of the proximal anastomosis. Proximal vein graft is widely patent with no areas of significant stenosis identified.  There is one focal stenosis which is identified approximately 12 cm distal to the distal patella.  This was a focal stenosis.  The vein below this looks widely patent and is anastomosed to a small posterior tibial artery which is diseased distally with poor run-off into the foot.  The anterior tibial and peroneal arteries were occluded.  The superficial femoral artery has the proximal stenosis just below the origin of the  vein graft and there is moderate diffuse disease throughout to the level of the popliteal artery which had a tight stenosis proximally that is in a blind popliteal segment and occlusion distally.  On the right side, the vein graft originates from the superficial femoral artery.   There is no significant stenosis within the common femoral, deep femoral or proximal superficial femoral artery.  The vein graft stenosis is widely patent and is anastomosed to the peroneal artery with a single vessel run-off via the peroneal artery on the right.  No vein graft stenosis is identified on the right.  The superficial femoral artery was occluded at the adductor canal and there is severe popliteal and tibial occlusive disease.  CONCLUSIONS:  Vein graft stenosis on the left is identified.  No significant vein graft stenosis on the right.  Severe superficial femoral artery and tibial occlusive disease bilaterally. Dictated by:   Di Kindle Edilia Bo, M.D. Attending Physician:  Bennye Alm DD:  04/10/02 TD:  04/10/02 Job: [id number]

## 2011-05-01 NOTE — Group Therapy Note (Signed)
NAMESANTITA, [name]                   ACCOUNT NO.:  [id number]   MEDICAL RECORD NO.:  [id number]          PATIENT TYPE:  INP   LOCATION:  A213                          FACILITY:  [facility]   PHYSICIAN:  Angus G. Renard Matter, MD   DATE OF BIRTH:  October 05, 1926   DATE OF PROCEDURE:  06/16/2005  DATE OF DISCHARGE:                                   PROGRESS NOTE   This patient was admitted with the chief problem being near syncope.  She  does have insulin-dependent diabetes and has had episodes of hypoglycemia  since admission.  Does have apparently a urinary tract infection as well.   OBJECTIVE:  VITAL SIGNS:  Blood pressure 123/58, respirations 20, pulse 71,  temp 97.  Blood sugars have ranged from 144-276.  LUNGS:  Clear.  HEART:  Regular rhythm.  ABDOMEN:  No palpable organs or masses.   The patient had carotid Doppler ultrasound which revealed a plaque formation  bilaterally, 50% to 69% ICA.   ASSESSMENT:  Patient admitted with syncope, hypoglycemia.  She does have  urinary tract infection.   PLAN:  To continue current regimen.  We will add an antibiotic, continue to  monitor.       AGM/MEDQ  D:  06/16/2005  T:  06/16/2005  Job:  [id number]

## 2011-05-01 NOTE — Consult Note (Signed)
[name], [name]                   ACCOUNT NO.:  [id number]   MEDICAL RECORD NO.:  [id number]          PATIENT TYPE:  INP   LOCATION:  A213                          FACILITY:  [facility]   PHYSICIAN:  Kofi A. Gerilyn Pilgrim, M.D. DATE OF BIRTH:  15-Aug-1926   DATE OF CONSULTATION:  DATE OF DISCHARGE:                                   CONSULTATION   IMPRESSION:  Recurrent bouts of unresponsiveness and alteration of  consciousness.   These spells are stereotyped, which raises the possibility of nonconvulsive  seizures.  Other possibilities include sleep attack and cardiac events.  She  does have a history of diabetes and is prone to have diabetic autonomic  neuropathy, although the spells seem not postural related, and this makes  this unlikely.  It maybe useful to do orthostatics anyway.   RECOMMENDATIONS:  I think she should have an EEG.  This is not available at  this time at this hospital.  We will therefore have her come back to our  clinic and get this done along with a sleep study.  I am also recommending  orthostatics while she is here.   HISTORY:  This is a 77 year old lady who has a baseline history of insulin-  dependent diabetes mellitus, polyneuropathies and significant lumbar disk  disease with left leg weakness.  We have seen her in the past for chronic  low back pain and left leg weakness.  The patient apparently developed  spells of unresponsive lasting for a couple of minutes with really no other  associated symptoms.  She does report a sensation of stroking but no clonic  activity and no clear loss of consciousness.  The spells occur repeatedly.  She has had her blood sugars checked several times during this spell, and  her sugars have been in the 100-200 range.  She actually had one in a  [facility] clinic when she went to see the ophthalmologist up there.  Blood  sugars were about 200.  Blood pressure is also fine.  These spells occur  while she is sitting mostly and does not  appear to have a postural  component.  No clear dizziness, shortness of breath, or chest pain is  reported.   PAST MEDICAL HISTORY:  Again, myocardial infarction, coronary disease,  peripheral vascular disease, hypertension, diabetes, severe multi-level  facet disease and degenerative disk disease with left leg weakness that has  been going on for several months.  Also, a history of diabetic neuropathy.   SOCIAL HISTORY:  Essentially unremarkable.   REVIEW OF SYSTEMS:  As in the history of present illness.  The patient's  family does endorse a history of significant snoring.  She does have a lot  of problems with frequent nocturia, waking up every hour to use the  bathroom.   PHYSICAL EXAMINATION:  VITAL SIGNS:  Temperature 98.2, pulse 71,  respirations 20, blood pressure 93/41.  GENERAL:  An obese, pleasant lady in no acute distress.  HEENT:  Head is normocephalic and atraumatic.  NECK:  Large but supple.  NEUROLOGIC:  Mentation:  She is awake  and alert.  She converses well.  Speech is normal.  Language and cognition are both normal.  She follows  commands bilaterally.  She is lucid.  Cranial nerves evaluation shows pupils  are both iridectomized, large and irregular, slightly more large on the  right.  They are both unreactive.  Visual fields are intact by  confrontational testing.  Extraocular movements are full.  Facial movements  are symmetric.  Tongue is midline.  Uvula is midline.  She does have  significant crowding of the posterior speech.  Motor examination shows 4+/5  venous flow in the left leg.  Other extremities show relatively normal  strength.  Does have some diminished bulk in the hands, especially the  intrinsic hand muscles.  Coordination is normal.  Reflexes are diminished or  absent bilaterally.  Plantar reflexes are both downgoing.   Carotid Dopplers show a left axial velocity of 120.  Right is 64.  Essentially 50% stenosis on the left.  The right is  unremarkable.   Her CT scan shows some chronic old findings.  A little area of hypodensity  associated in the left internal carotid, external capsule, and the corona  radiatum.  There are mild chronic ischemic changes.   Thanks for this consultation.      Kofi A. Gerilyn Pilgrim, M.D.  Electronically Signed     KAD/MEDQ  D:  06/15/2005  T:  06/15/2005  Job:  [id number]

## 2011-05-06 NOTE — Procedures (Unsigned)
BYPASS GRAFT EVALUATION  INDICATION:  Follow up right fem-peroneal graft.  HISTORY: Diabetes:  Yes. Cardiac:  Yes. Hypertension:  Yes. Smoking:  No. Previous Surgery:  Fem-pop graft, 10/07/95; left AKA 12/12/2009.  SINGLE LEVEL ARTERIAL EXAM                              RIGHT              LEFT Brachial: Anterior tibial: Posterior tibial: Peroneal: Ankle/brachial index:  PREVIOUS ABI:  Date: 12/24/2010  RIGHT:  1.02  (likely inaccurate) LEFT:  LOWER EXTREMITY BYPASS GRAFT DUPLEX EXAM:  DUPLEX: 1. Essentially patent right fem-pop graft with evidence of distal     anastomosis occlusion in the native artery. 2. The posterior tibial and peroneal arteries appear to be fed by     collaterals while the anterior tibial artery could not be     identified. 3. The graft shows evidence of degradation.  IMPRESSION:  Patent graft with occlusion of native outflow artery, as described above.  ___________________________________________ Di Kindle. Edilia Bo, M.D.  LT/MEDQ  D:  04/29/2011  T:  04/29/2011  Job:  [id number]

## 2011-05-20 ENCOUNTER — Ambulatory Visit: Payer: Medicare Other

## 2011-05-27 ENCOUNTER — Ambulatory Visit (INDEPENDENT_AMBULATORY_CARE_PROVIDER_SITE_OTHER): Payer: Medicare Other | Admitting: Vascular Surgery

## 2011-05-27 DIAGNOSIS — I70219 Atherosclerosis of native arteries of extremities with intermittent claudication, unspecified extremity: Secondary | ICD-10-CM

## 2011-05-28 NOTE — Assessment & Plan Note (Signed)
OFFICE VISIT  [name], [name] L DOB:  1926/05/25                                       05/27/2011 EAVWU#:[id number]  I saw this lady in the office today for continued follow-up of her right great toe wound.  She has some dry gangrene along the lateral aspect of her right great toe.  She has had a previous left above-the-knee amputation.  She has a functioning fem-tibial bypass graft with severe tibial occlusive disease and is not felt to have any further options for revascularization.  She had been on Keflex but has completed this course.  She states she has not had any significant pain with the toe and the wound has been relatively stable.  She is doing dressing changes with Silvadene.  REVIEW OF SYSTEMS:  She has had no fever or chills.  PHYSICAL EXAMINATION:  This is a pleasant 77 year old woman who appears her stated age.  Her blood pressure is 165/72, heart rate is 82, temperature is 98.  The toe wound is stable with dry gangrene of the lateral aspect of her right fifth toe, some mild cellulitis in the forefoot but no drainage.  We will have to continue her dressing changes with Silvadene.  I have written her prescription for Keflex.  I plan on seeing her back in 6 weeks.  If this wound progresses, she would likely require above-the- knee amputation on the right.  The only other consideration would be to do an arteriogram to see if there was any potential option for endovascular approach, although I think this is very unlikely.    Di Kindle. Edilia Bo, M.D. Electronically Signed  CSD/MEDQ  D:  05/27/2011  T:  05/28/2011  Job:  [id number]

## 2011-05-29 ENCOUNTER — Encounter: Payer: Self-pay | Admitting: Vascular Surgery

## 2011-06-08 ENCOUNTER — Ambulatory Visit (INDEPENDENT_AMBULATORY_CARE_PROVIDER_SITE_OTHER): Payer: Medicare Other | Admitting: Surgery

## 2011-06-08 DIAGNOSIS — I70269 Atherosclerosis of native arteries of extremities with gangrene, unspecified extremity: Secondary | ICD-10-CM

## 2011-06-09 ENCOUNTER — Inpatient Hospital Stay (HOSPITAL_COMMUNITY)
Admission: RE | Admit: 2011-06-09 | Discharge: 2011-06-11 | DRG: 257 | Disposition: A | Discharge status: Home Health | Payer: Medicare Other | Source: Ambulatory Visit | Attending: Surgery | Admitting: Surgery

## 2011-06-09 ENCOUNTER — Other Ambulatory Visit: Payer: Self-pay | Admitting: Surgery

## 2011-06-09 ENCOUNTER — Inpatient Hospital Stay (HOSPITAL_COMMUNITY): Payer: Medicare Other

## 2011-06-09 DIAGNOSIS — M549 Dorsalgia, unspecified: Secondary | ICD-10-CM | POA: Diagnosis present

## 2011-06-09 DIAGNOSIS — Z8614 Personal history of Methicillin resistant Staphylococcus aureus infection: Secondary | ICD-10-CM

## 2011-06-09 DIAGNOSIS — I739 Peripheral vascular disease, unspecified: Secondary | ICD-10-CM

## 2011-06-09 DIAGNOSIS — I70209 Unspecified atherosclerosis of native arteries of extremities, unspecified extremity: Principal | ICD-10-CM | POA: Diagnosis present

## 2011-06-09 DIAGNOSIS — I70269 Atherosclerosis of native arteries of extremities with gangrene, unspecified extremity: Secondary | ICD-10-CM

## 2011-06-09 DIAGNOSIS — E119 Type 2 diabetes mellitus without complications: Secondary | ICD-10-CM | POA: Diagnosis present

## 2011-06-09 DIAGNOSIS — S78119A Complete traumatic amputation at level between unspecified hip and knee, initial encounter: Secondary | ICD-10-CM

## 2011-06-09 DIAGNOSIS — Z8673 Personal history of transient ischemic attack (TIA), and cerebral infarction without residual deficits: Secondary | ICD-10-CM

## 2011-06-09 DIAGNOSIS — I252 Old myocardial infarction: Secondary | ICD-10-CM

## 2011-06-09 LAB — BASIC METABOLIC PANEL
BUN: 15 mg/dL (ref 6–23)
Calcium: 8.8 mg/dL (ref 8.4–10.5)
Creatinine, Ser: 1.24 mg/dL — ABNORMAL HIGH (ref 0.50–1.10)
GFR calc Af Amer: 50 mL/min — ABNORMAL LOW (ref >60)
GFR calc non Af Amer: 41 mL/min — ABNORMAL LOW (ref >60)

## 2011-06-09 LAB — GLUCOSE, CAPILLARY
Glucose-Capillary: 195 mg/dL — ABNORMAL HIGH (ref 70–99)
Glucose-Capillary: 227 mg/dL — ABNORMAL HIGH (ref 70–99)

## 2011-06-09 LAB — CBC
HCT: 32.8 % — ABNORMAL LOW (ref 36.0–46.0)
Hemoglobin: 11.5 g/dL — ABNORMAL LOW (ref 12.0–15.0)
MCV: 86.1 fL (ref 78.0–100.0)
Platelets: 250 10*3/uL (ref 150–400)
RBC: 3.81 MIL/uL — ABNORMAL LOW (ref 3.87–5.11)
WBC: 9.2 10*3/uL (ref 4.0–10.5)

## 2011-06-09 LAB — SURGICAL PCR SCREEN: MRSA, PCR: NEGATIVE

## 2011-06-09 NOTE — Assessment & Plan Note (Signed)
OFFICE VISIT  [name], [name] DOB:  May 09, 1926                                       06/08/2011 [id number]#:[id number]  HISTORY:  This is an 77 year old female followed by Dr. Edilia Bo in the office.  She is status post left leg amputation.  She had a right femoral peroneal bypass by Dr. Hart Rochester in the '90s.  Her bypass has distal anastomotic occlusion with filling of collaterals.  Her ABI was 1.02 with thought to be elevated due to calcified vessels.  She has no options for further revascularization.  She has had an ingrown toenail removed.  She has developed an eschar over this area that has failed nonoperative therapy.  She has developed cellulitis and is on antibiotics, recently switched to Cipro for a UTI from Keflex.  PAST MEDICAL HISTORY:  Peripheral vascular disease, hypertension, coronary artery disease, diabetes, breast cancer, obesity, diabetic nephropathy, degenerative joint disease, osteomyelitis.  MEDICATIONS:  Please see medical record.  ALLERGIES:  Sulfa, penicillin, aspirin and codeine.  PHYSICAL EXAM:  Vital signs:  Heart rate 76, blood pressure 126/70, temperature is 97.8.  General:  She is resting comfortably. Respirations:  Nonlabored.  Extremities:  The right great toe has necrotic medial aspect to the nail bed.  There is surrounding erythema going onto the plantar and dorsum of the foot which does blanch with palpation.  Pulses are not palpable.  I discussed with the patient that this is a limb threatening situation. She does not have options for revascularization.  I offered her angiogram but she refused because of a previous experience.  I have recommended proceeding with a toe amputation understanding that this may not heal and she may ultimately require more proximal amputation.  Dr. Hart Rochester had done her original operation.  He was available for second opinion at the patient's request and he agreed in proceeding with great toe  amputation.  Family understands that we may need to leave the wound open and that it may not be able to be closed.  I will plan on admitting her after the operation to follow her wound very closely as well as to give her IV antibiotics to see if this needs more proximal revision.    Jorge Ny, MD Electronically Signed  VWB/MEDQ  D:  06/08/2011  T:  06/09/2011  Job:  [id number]  cc:   B. Theola Sequin, MD Dr Tanda Rockers

## 2011-06-10 LAB — CBC
Hemoglobin: 10.8 g/dL — ABNORMAL LOW (ref 12.0–15.0)
Platelets: 225 10*3/uL (ref 150–400)
RBC: 3.74 MIL/uL — ABNORMAL LOW (ref 3.87–5.11)

## 2011-06-10 LAB — BASIC METABOLIC PANEL
CO2: 27 mEq/L (ref 19–32)
Calcium: 8.4 mg/dL (ref 8.4–10.5)
GFR calc non Af Amer: 42 mL/min — ABNORMAL LOW (ref >60)
Glucose, Bld: 294 mg/dL — ABNORMAL HIGH (ref 70–99)
Potassium: 4.4 mEq/L (ref 3.5–5.1)
Sodium: 135 mEq/L (ref 135–145)

## 2011-06-10 LAB — GLUCOSE, CAPILLARY
Glucose-Capillary: 288 mg/dL — ABNORMAL HIGH (ref 70–99)
Glucose-Capillary: 310 mg/dL — ABNORMAL HIGH (ref 70–99)
Glucose-Capillary: 195 mg/dL — ABNORMAL HIGH (ref 70–99)
Glucose-Capillary: 262 mg/dL — ABNORMAL HIGH (ref 70–99)

## 2011-06-11 LAB — GLUCOSE, CAPILLARY
Glucose-Capillary: 224 mg/dL — ABNORMAL HIGH (ref 70–99)
Glucose-Capillary: 223 mg/dL — ABNORMAL HIGH (ref 70–99)

## 2011-06-16 ENCOUNTER — Ambulatory Visit (INDEPENDENT_AMBULATORY_CARE_PROVIDER_SITE_OTHER): Payer: Medicare Other | Admitting: Vascular Surgery

## 2011-06-16 DIAGNOSIS — I70269 Atherosclerosis of native arteries of extremities with gangrene, unspecified extremity: Secondary | ICD-10-CM

## 2011-06-16 NOTE — H&P (Signed)
HISTORY AND PHYSICAL EXAMINATION  June 16, 2011  Re:  [name], [name]             DOB:  06-03-1926  Date of admission will be July 6.  CHIEF COMPLAINT:  Gangrene of right first toe amputation site with severe peripheral vascular disease.  HISTORY OF PRESENT ILLNESS:  This 77 year old female has had multiple vascular procedures in the past by Dr. Edilia Bo on the left leg and by Dr. Hart Rochester on the right leg.  She has previously had left above knee amputation which did have some postoperative complications requiring mechanical ventilation.  She recently had attempt at a right first toe amputation by Dr. Myra Gianotti for an ischemic right first toe realizing that this was a last ditch effort for limb salvage.  Today she now has a nonhealing of that with gangrene of the skin flaps and is scheduled for right above knee amputation.  PAST MEDICAL HISTORY: 1. Hypertension. 2. Coronary artery disease. 3. Diabetes. 4. History of breast cancer. 5. Obesity. 6. Diabetic neuropathy. 7. Degenerative joint disease  SOCIAL HISTORY:  She is widowed, has 3 children, does not use tobacco or alcohol.  FAMILY HISTORY:  Positive for coronary artery disease in her father.  REVIEW OF SYSTEMS:  Positive for loss of appetite, fever, dizziness, decreased hearing, arthritis joint pain, dyspnea on exertion, depression, anxiety, reflux esophagitis, constipation, dysuria, urinary frequency.  All other systems are negative.  PHYSICAL EXAM:  Vital signs:  Blood pressure 150/59, heart rate 83, temperature 99.1.  General:  She is a chronically ill-appearing female in no apparent distress, alert and oriented x3.  HEENT:  Exam normal for age.  EOMs intact.  Lungs:  Clear to auscultation.  No rhonchi or wheezing.  Cardiovascular:  Regular rhythm.  No murmurs.  Carotid pulses 3+.  No bruits.  Abdomen:  Soft, nontender with no masses.  She has left above knee amputation well-healed.  Right leg has a  recent first toe amputation with gangrene in the flaps and some mild cellulitis proximally.  She has a 2+ femoral and graft pulse in the medial aspect of the right leg.  IMPRESSION:  Gangrene right first toe amputation site with severe peripheral vascular disease.  PLAN:  Right above knee amputation by Dr. Arbie Cookey on Friday July 6.  Risks and benefits have been thoroughly with the patient and her daughter and they would like to proceed.    Quita Skye Hart Rochester, M.D. Electronically Signed  JDL/MEDQ  D:  06/16/2011  T:  06/16/2011  Job:  [id number]

## 2011-06-18 ENCOUNTER — Telehealth: Payer: Self-pay | Admitting: Cardiology

## 2011-06-18 NOTE — Telephone Encounter (Signed)
Faxed LOV, EKG & CT to [name] ([phone]).

## 2011-06-19 ENCOUNTER — Other Ambulatory Visit: Payer: Self-pay | Admitting: Vascular Surgery

## 2011-06-19 ENCOUNTER — Inpatient Hospital Stay (HOSPITAL_COMMUNITY)
Admission: RE | Admit: 2011-06-19 | Discharge: 2011-06-24 | DRG: 475 | Disposition: A | Discharge status: Rehabilitation Facility | Payer: Medicare Other | Source: Ambulatory Visit | Attending: Vascular Surgery | Admitting: Vascular Surgery

## 2011-06-19 DIAGNOSIS — I10 Essential (primary) hypertension: Secondary | ICD-10-CM | POA: Diagnosis present

## 2011-06-19 DIAGNOSIS — Z951 Presence of aortocoronary bypass graft: Secondary | ICD-10-CM

## 2011-06-19 DIAGNOSIS — I2510 Atherosclerotic heart disease of native coronary artery without angina pectoris: Secondary | ICD-10-CM | POA: Diagnosis present

## 2011-06-19 DIAGNOSIS — L408 Other psoriasis: Secondary | ICD-10-CM | POA: Diagnosis present

## 2011-06-19 DIAGNOSIS — Y835 Amputation of limb(s) as the cause of abnormal reaction of the patient, or of later complication, without mention of misadventure at the time of the procedure: Secondary | ICD-10-CM | POA: Diagnosis present

## 2011-06-19 DIAGNOSIS — R32 Unspecified urinary incontinence: Secondary | ICD-10-CM | POA: Diagnosis present

## 2011-06-19 DIAGNOSIS — E1142 Type 2 diabetes mellitus with diabetic polyneuropathy: Secondary | ICD-10-CM | POA: Diagnosis present

## 2011-06-19 DIAGNOSIS — Z79899 Other long term (current) drug therapy: Secondary | ICD-10-CM

## 2011-06-19 DIAGNOSIS — I96 Gangrene, not elsewhere classified: Secondary | ICD-10-CM | POA: Diagnosis present

## 2011-06-19 DIAGNOSIS — Z7901 Long term (current) use of anticoagulants: Secondary | ICD-10-CM

## 2011-06-19 DIAGNOSIS — T8789 Other complications of amputation stump: Principal | ICD-10-CM | POA: Diagnosis present

## 2011-06-19 DIAGNOSIS — Z9010 Acquired absence of unspecified breast and nipple: Secondary | ICD-10-CM

## 2011-06-19 DIAGNOSIS — I739 Peripheral vascular disease, unspecified: Secondary | ICD-10-CM | POA: Diagnosis present

## 2011-06-19 DIAGNOSIS — I252 Old myocardial infarction: Secondary | ICD-10-CM

## 2011-06-19 DIAGNOSIS — S78119A Complete traumatic amputation at level between unspecified hip and knee, initial encounter: Secondary | ICD-10-CM

## 2011-06-19 DIAGNOSIS — H409 Unspecified glaucoma: Secondary | ICD-10-CM | POA: Diagnosis present

## 2011-06-19 DIAGNOSIS — E1159 Type 2 diabetes mellitus with other circulatory complications: Secondary | ICD-10-CM | POA: Diagnosis present

## 2011-06-19 DIAGNOSIS — Z794 Long term (current) use of insulin: Secondary | ICD-10-CM

## 2011-06-19 DIAGNOSIS — Z853 Personal history of malignant neoplasm of breast: Secondary | ICD-10-CM

## 2011-06-19 DIAGNOSIS — Z6833 Body mass index (BMI) 33.0-33.9, adult: Secondary | ICD-10-CM

## 2011-06-19 DIAGNOSIS — K219 Gastro-esophageal reflux disease without esophagitis: Secondary | ICD-10-CM | POA: Diagnosis present

## 2011-06-19 DIAGNOSIS — E669 Obesity, unspecified: Secondary | ICD-10-CM | POA: Diagnosis present

## 2011-06-19 DIAGNOSIS — I70269 Atherosclerosis of native arteries of extremities with gangrene, unspecified extremity: Secondary | ICD-10-CM

## 2011-06-19 DIAGNOSIS — Y92009 Unspecified place in unspecified non-institutional (private) residence as the place of occurrence of the external cause: Secondary | ICD-10-CM

## 2011-06-19 DIAGNOSIS — E1149 Type 2 diabetes mellitus with other diabetic neurological complication: Secondary | ICD-10-CM | POA: Diagnosis present

## 2011-06-19 LAB — BASIC METABOLIC PANEL
BUN: 20 mg/dL (ref 6–23)
CO2: 28 mEq/L (ref 19–32)
Chloride: 99 mEq/L (ref 96–112)
Glucose, Bld: 152 mg/dL — ABNORMAL HIGH (ref 70–99)
Potassium: 4.2 mEq/L (ref 3.5–5.1)
Sodium: 137 mEq/L (ref 135–145)

## 2011-06-19 LAB — DIFFERENTIAL
Basophils Absolute: 0 10*3/uL (ref 0.0–0.1)
Eosinophils Relative: 2 % (ref 0–5)
Lymphocytes Relative: 18 % (ref 12–46)
Lymphs Abs: 2 10*3/uL (ref 0.7–4.0)
Neutro Abs: 8.4 10*3/uL — ABNORMAL HIGH (ref 1.7–7.7)

## 2011-06-19 LAB — CBC
HCT: 31.6 % — ABNORMAL LOW (ref 36.0–46.0)
Hemoglobin: 10.7 g/dL — ABNORMAL LOW (ref 12.0–15.0)
MCV: 86.1 fL (ref 78.0–100.0)
WBC: 11.4 10*3/uL — ABNORMAL HIGH (ref 4.0–10.5)

## 2011-06-19 LAB — GLUCOSE, CAPILLARY
Glucose-Capillary: 149 mg/dL — ABNORMAL HIGH (ref 70–99)
Glucose-Capillary: 166 mg/dL — ABNORMAL HIGH (ref 70–99)

## 2011-06-20 LAB — HEMOGLOBIN A1C: Mean Plasma Glucose: 203 mg/dL — ABNORMAL HIGH (ref <117)

## 2011-06-20 LAB — CBC
MCV: 86.7 fL (ref 78.0–100.0)
Platelets: 257 10*3/uL (ref 150–400)
RBC: 3.47 MIL/uL — ABNORMAL LOW (ref 3.87–5.11)
RDW: 12.4 % (ref 11.5–15.5)
WBC: 8.9 10*3/uL (ref 4.0–10.5)

## 2011-06-20 LAB — BASIC METABOLIC PANEL
BUN: 16 mg/dL (ref 6–23)
Calcium: 9.1 mg/dL (ref 8.4–10.5)
Creatinine, Ser: 1.36 mg/dL — ABNORMAL HIGH (ref 0.50–1.10)
GFR calc Af Amer: 45 mL/min — ABNORMAL LOW (ref >60)
GFR calc non Af Amer: 37 mL/min — ABNORMAL LOW (ref >60)

## 2011-06-20 LAB — GLUCOSE, CAPILLARY
Glucose-Capillary: 262 mg/dL — ABNORMAL HIGH (ref 70–99)
Glucose-Capillary: 220 mg/dL — ABNORMAL HIGH (ref 70–99)

## 2011-06-21 LAB — GLUCOSE, CAPILLARY
Glucose-Capillary: 272 mg/dL — ABNORMAL HIGH (ref 70–99)
Glucose-Capillary: 230 mg/dL — ABNORMAL HIGH (ref 70–99)
Glucose-Capillary: 269 mg/dL — ABNORMAL HIGH (ref 70–99)
Glucose-Capillary: 234 mg/dL — ABNORMAL HIGH (ref 70–99)

## 2011-06-22 DIAGNOSIS — L98499 Non-pressure chronic ulcer of skin of other sites with unspecified severity: Secondary | ICD-10-CM

## 2011-06-22 DIAGNOSIS — I739 Peripheral vascular disease, unspecified: Secondary | ICD-10-CM

## 2011-06-22 DIAGNOSIS — S78119A Complete traumatic amputation at level between unspecified hip and knee, initial encounter: Secondary | ICD-10-CM

## 2011-06-22 LAB — GLUCOSE, CAPILLARY
Glucose-Capillary: 137 mg/dL — ABNORMAL HIGH (ref 70–99)
Glucose-Capillary: 92 mg/dL (ref 70–99)

## 2011-06-23 LAB — GLUCOSE, CAPILLARY
Glucose-Capillary: 87 mg/dL (ref 70–99)
Glucose-Capillary: 103 mg/dL — ABNORMAL HIGH (ref 70–99)
Glucose-Capillary: 250 mg/dL — ABNORMAL HIGH (ref 70–99)
Glucose-Capillary: 154 mg/dL — ABNORMAL HIGH (ref 70–99)

## 2011-06-24 ENCOUNTER — Inpatient Hospital Stay (HOSPITAL_COMMUNITY)
Admission: RE | Admit: 2011-06-24 | Discharge: 2011-07-02 | DRG: 945 | Disposition: A | Discharge status: Home Health | Payer: Medicare Other | Source: Ambulatory Visit | Attending: Physical Medicine & Rehabilitation | Admitting: Physical Medicine & Rehabilitation

## 2011-06-24 DIAGNOSIS — I96 Gangrene, not elsewhere classified: Secondary | ICD-10-CM

## 2011-06-24 DIAGNOSIS — F411 Generalized anxiety disorder: Secondary | ICD-10-CM

## 2011-06-24 DIAGNOSIS — N390 Urinary tract infection, site not specified: Secondary | ICD-10-CM

## 2011-06-24 DIAGNOSIS — E119 Type 2 diabetes mellitus without complications: Secondary | ICD-10-CM

## 2011-06-24 DIAGNOSIS — S78119A Complete traumatic amputation at level between unspecified hip and knee, initial encounter: Secondary | ICD-10-CM

## 2011-06-24 DIAGNOSIS — Z79899 Other long term (current) drug therapy: Secondary | ICD-10-CM

## 2011-06-24 DIAGNOSIS — N189 Chronic kidney disease, unspecified: Secondary | ICD-10-CM

## 2011-06-24 DIAGNOSIS — I739 Peripheral vascular disease, unspecified: Secondary | ICD-10-CM

## 2011-06-24 DIAGNOSIS — I10 Essential (primary) hypertension: Secondary | ICD-10-CM

## 2011-06-24 DIAGNOSIS — D649 Anemia, unspecified: Secondary | ICD-10-CM

## 2011-06-24 DIAGNOSIS — Z853 Personal history of malignant neoplasm of breast: Secondary | ICD-10-CM

## 2011-06-24 DIAGNOSIS — R339 Retention of urine, unspecified: Secondary | ICD-10-CM

## 2011-06-24 DIAGNOSIS — M109 Gout, unspecified: Secondary | ICD-10-CM

## 2011-06-24 DIAGNOSIS — L98499 Non-pressure chronic ulcer of skin of other sites with unspecified severity: Secondary | ICD-10-CM

## 2011-06-24 DIAGNOSIS — Z5189 Encounter for other specified aftercare: Secondary | ICD-10-CM

## 2011-06-24 DIAGNOSIS — Z794 Long term (current) use of insulin: Secondary | ICD-10-CM

## 2011-06-24 DIAGNOSIS — E1165 Type 2 diabetes mellitus with hyperglycemia: Secondary | ICD-10-CM

## 2011-06-24 DIAGNOSIS — H409 Unspecified glaucoma: Secondary | ICD-10-CM

## 2011-06-24 DIAGNOSIS — I129 Hypertensive chronic kidney disease with stage 1 through stage 4 chronic kidney disease, or unspecified chronic kidney disease: Secondary | ICD-10-CM

## 2011-06-24 DIAGNOSIS — I2510 Atherosclerotic heart disease of native coronary artery without angina pectoris: Secondary | ICD-10-CM

## 2011-06-24 LAB — BASIC METABOLIC PANEL
GFR calc Af Amer: 38 mL/min — ABNORMAL LOW (ref >60)
GFR calc non Af Amer: 31 mL/min — ABNORMAL LOW (ref >60)
Glucose, Bld: 163 mg/dL — ABNORMAL HIGH (ref 70–99)
Potassium: 4.3 mEq/L (ref 3.5–5.1)
Sodium: 135 mEq/L (ref 135–145)

## 2011-06-24 LAB — GLUCOSE, CAPILLARY
Glucose-Capillary: 139 mg/dL — ABNORMAL HIGH (ref 70–99)
Glucose-Capillary: 151 mg/dL — ABNORMAL HIGH (ref 70–99)

## 2011-06-24 LAB — CBC
Hemoglobin: 9.5 g/dL — ABNORMAL LOW (ref 12.0–15.0)
MCV: 86.4 fL (ref 78.0–100.0)
Platelets: 292 10*3/uL (ref 150–400)
RBC: 3.3 MIL/uL — ABNORMAL LOW (ref 3.87–5.11)
WBC: 8.3 10*3/uL (ref 4.0–10.5)

## 2011-06-25 DIAGNOSIS — Z5189 Encounter for other specified aftercare: Secondary | ICD-10-CM

## 2011-06-25 DIAGNOSIS — E1165 Type 2 diabetes mellitus with hyperglycemia: Secondary | ICD-10-CM

## 2011-06-25 DIAGNOSIS — L98499 Non-pressure chronic ulcer of skin of other sites with unspecified severity: Secondary | ICD-10-CM

## 2011-06-25 DIAGNOSIS — I10 Essential (primary) hypertension: Secondary | ICD-10-CM

## 2011-06-25 DIAGNOSIS — I739 Peripheral vascular disease, unspecified: Secondary | ICD-10-CM

## 2011-06-25 DIAGNOSIS — S78119A Complete traumatic amputation at level between unspecified hip and knee, initial encounter: Secondary | ICD-10-CM

## 2011-06-25 LAB — COMPREHENSIVE METABOLIC PANEL
ALT: 11 U/L (ref 0–35)
AST: 14 U/L (ref 0–37)
Albumin: 2.4 g/dL — ABNORMAL LOW (ref 3.5–5.2)
Alkaline Phosphatase: 116 U/L (ref 39–117)
Potassium: 4.2 mEq/L (ref 3.5–5.1)
Sodium: 138 mEq/L (ref 135–145)
Total Protein: 7.3 g/dL (ref 6.0–8.3)

## 2011-06-25 LAB — GLUCOSE, CAPILLARY
Glucose-Capillary: 140 mg/dL — ABNORMAL HIGH (ref 70–99)
Glucose-Capillary: 172 mg/dL — ABNORMAL HIGH (ref 70–99)
Glucose-Capillary: 177 mg/dL — ABNORMAL HIGH (ref 70–99)

## 2011-06-25 LAB — URINE MICROSCOPIC-ADD ON

## 2011-06-25 LAB — URINALYSIS, ROUTINE W REFLEX MICROSCOPIC
Glucose, UA: NEGATIVE mg/dL
Specific Gravity, Urine: 1.016 (ref 1.005–1.030)

## 2011-06-25 LAB — CBC
HCT: 31 % — ABNORMAL LOW (ref 36.0–46.0)
Hemoglobin: 10.6 g/dL — ABNORMAL LOW (ref 12.0–15.0)
MCHC: 34.2 g/dL (ref 30.0–36.0)
MCV: 85.4 fL (ref 78.0–100.0)
WBC: 8.4 10*3/uL (ref 4.0–10.5)

## 2011-06-25 LAB — DIFFERENTIAL
Basophils Absolute: 0 10*3/uL (ref 0.0–0.1)
Lymphocytes Relative: 23 % (ref 12–46)
Lymphs Abs: 1.9 10*3/uL (ref 0.7–4.0)
Monocytes Absolute: 0.6 10*3/uL (ref 0.1–1.0)
Neutro Abs: 5.6 10*3/uL (ref 1.7–7.7)

## 2011-06-26 LAB — URINE CULTURE
Colony Count: NO GROWTH
Culture: NO GROWTH

## 2011-06-26 LAB — GLUCOSE, CAPILLARY: Glucose-Capillary: 204 mg/dL — ABNORMAL HIGH (ref 70–99)

## 2011-06-27 LAB — GLUCOSE, CAPILLARY
Glucose-Capillary: 173 mg/dL — ABNORMAL HIGH (ref 70–99)
Glucose-Capillary: 100 mg/dL — ABNORMAL HIGH (ref 70–99)

## 2011-06-28 LAB — GLUCOSE, CAPILLARY
Glucose-Capillary: 163 mg/dL — ABNORMAL HIGH (ref 70–99)
Glucose-Capillary: 121 mg/dL — ABNORMAL HIGH (ref 70–99)

## 2011-06-28 NOTE — Discharge Summary (Signed)
[name], [name]                   ACCOUNT NO.:  [id number]  MEDICAL RECORD NO.:  [id number]  LOCATION:  2040                         FACILITY:  [facility]  PHYSICIAN:  Juleen China IV, MDDATE OF BIRTH:  May 02, 1926  DATE OF ADMISSION:  06/19/2011 DATE OF DISCHARGE:                              DISCHARGE SUMMARY   ADMITTING DIAGNOSIS:  Ischemic infected right great toe.  PAST MEDICAL HISTORY/DISCHARGE DIAGNOSES: 1. Infected ischemic right great toe status post right great toe     amputation including metatarsal head. 2. Carotid artery disease. 3. Coronary artery disease. 4. Peripheral vascular disease. 5. Hypertension. 6. Breast cancer. 7. Gallstones. 8. Glaucoma. 9. Helicobacter pylori gastritis. 10.Diabetes mellitus. 11.Psoriasis. 12.History of right femoral-popliteal bypass. 13.Status post hysterectomy with umbilical hernia repair. 14.Status post left mastectomy. 15.Retinal detachment. 16.Left third toe osteomyelitis status post amputation. 17.Tonsillectomy and adenoidectomy. 18.Tubular adenoma removal. 19.Appendectomy. 20.Left lower extremity amputation.  BRIEF HISTORY:  The patient is an 77 year old female who has previously undergone a left leg amputation.  She had also had a right peroneal bypass graft.  The patient presented to the office and was noted to have an ischemic great toe on the right foot.  She was not a candidate for further revascularization.  Dr. Myra Gianotti discussed with the patient and her family that the only options were to do a toe amputation and see if it heals versus proceeding with below-the-knee amputation.  The patient's family decided to proceed with the toe amputation in hopes that will heal.  HOSPITAL COURSE:  The patient was admitted and taken to the OR on June 09, 2011 for right great toe amputation including metatarsal head.  The patient tolerated the procedure well and was hemodynamically stable immediately postoperatively.  She was  transferred from the OR to the Postanesthesia Care Unit in stable condition.  The patient's postoperative course progressed as expected.  She remained afebrile with stable vital signs throughout the hospital course.  A DARCO shoe was obtained for the patient and Physical Therapy and Occupational Therapy both recommended Home Health Services for the patient.    On June 11, 2011 the patient was without complaint.  She was afebrile with stable vital signs.  The right toe amputation was clean with bloody drainage.  The sutures were intact and there was minimal erythema on the dorsum of the foot.  The patient was in stable condition and felt ready for discharge home at that time with Home Health RN, PT, and OT.  The patient received specific written discharge instructions regarding diet, activity, and wound care.  She is to continue working with Physical Therapy and Occupational Therapy.  The patient is to follow up with Dr. Myra Gianotti in 2 weeks.  DISCHARGE MEDICATIONS: 1. Cipro 500 mg 1 p.o. b.i.d. 2. Pilocarpine ophthalmic 1 drop both eyes b.i.d. 3. Pantoprazole 40 mg daily. 4. Multivitamin daily. 5. Lasix 20 mg daily. 6. Lantus 55 units subcu q.a.m. 7. Humalog 2-8 units subcu t.i.d. 8. Cosopt ophthalmic 1 drop both eyes b.i.d. 9. Aspirin 81 mg daily. 10.Amlodipine 10 mg daily. 11.Allopurinol 100 mg daily. 12.Acetaminophen 500 mg p.o. q.6 p.r.n.     Pecola Leisure, PA  ______________________________ V. Charlena Cross, MD    AY/MEDQ  D:  06/22/2011  T:  06/23/2011  Job:  [id number]  Electronically Signed by Pecola Leisure PA on 06/26/2011 01:28:37 PM Electronically Signed by Arelia Longest IV MD on 06/28/2011 11:04:27 PM

## 2011-06-28 NOTE — Op Note (Signed)
  [name], [name]                   ACCOUNT NO.:  [id number]  MEDICAL RECORD NO.:  [id number]  LOCATION:  2014                         FACILITY:  [facility]  PHYSICIAN:  Juleen China IV, MDDATE OF BIRTH:  10/11/1926  DATE OF PROCEDURE:  06/09/2011 DATE OF DISCHARGE:                              OPERATIVE REPORT   PREOPERATIVE DIAGNOSIS:  Infected/ischemic right great toe.  POSTOPERATIVE DIAGNOSIS:  Infected/ischemic right great toe.  PROCEDURE PERFORMED:  Right great toe amputation including metatarsal head.  SURGEON: 1. Durene Cal IV, MD  ANESTHESIA:  Ankle block.  SPECIMENS:  Right great toe.  FINDINGS:  No gross purulence.  Moderate bleeding, healthy bone.  INDICATION:  [name] is an 77 year old female who had previously undergone left leg amputation.  She also has a right peroneal bypass graft.  She is not a candidate for further revascularization.  She had an infected ischemic great toe yesterday in the office.  I scheduled her for amputation today.  I discussed extensively with the family that she is not a candidate for further revascularization.  Our options at this time are to do a toe amputation and see if it heals versus proceeding with the below-knee amputation.  They wished to see if we can get a toe amputation to heal.  PROCEDURE:  The patient was identified in the holding area and taken to room #8, placed supine on the table.  Ankle block was placed in the holding area.  Sedation was given.  She was prepped and draped in the usual fashion.  A time-out was called.  Antibiotics were given.  A tennis racket-type incision was made around the great toe.  A #10 blade was used to cut the skin down to the bone.  Bone cutters were used to transect the bone.  The bone was healthy.  There was no gross purulence. There was a moderate amount of bleeding in this area.  The subcutaneous tissue was not grossly necrotic.  This process was limited to the skin. I then used  rongeurs to debride back including the metatarsal head which was healthy bone at this level.  Because there was no gross purulence, I elected to close the wound loosely.  The wound was irrigated and closed with 3 interrupted mattress sutures of 3-0 nylon.  Sterile dressings were applied.  The patient tolerated the procedure well.  No complications.     Jorge Ny, MD     VWB/MEDQ  D:  06/09/2011  T:  06/10/2011  Job:  [id number]  Electronically Signed by Arelia Longest IV MD on 06/28/2011 11:04:20 PM

## 2011-06-29 DIAGNOSIS — I739 Peripheral vascular disease, unspecified: Secondary | ICD-10-CM

## 2011-06-29 DIAGNOSIS — L98499 Non-pressure chronic ulcer of skin of other sites with unspecified severity: Secondary | ICD-10-CM

## 2011-06-29 DIAGNOSIS — E1165 Type 2 diabetes mellitus with hyperglycemia: Secondary | ICD-10-CM

## 2011-06-29 DIAGNOSIS — S78119A Complete traumatic amputation at level between unspecified hip and knee, initial encounter: Secondary | ICD-10-CM

## 2011-06-29 DIAGNOSIS — I10 Essential (primary) hypertension: Secondary | ICD-10-CM

## 2011-06-29 DIAGNOSIS — Z5189 Encounter for other specified aftercare: Secondary | ICD-10-CM

## 2011-06-29 HISTORY — PX: ABOVE KNEE LEG AMPUTATION: SUR20

## 2011-06-29 LAB — GLUCOSE, CAPILLARY: Glucose-Capillary: 167 mg/dL — ABNORMAL HIGH (ref 70–99)

## 2011-06-30 LAB — GLUCOSE, CAPILLARY
Glucose-Capillary: 169 mg/dL — ABNORMAL HIGH (ref 70–99)
Glucose-Capillary: 259 mg/dL — ABNORMAL HIGH (ref 70–99)

## 2011-07-01 ENCOUNTER — Ambulatory Visit: Payer: Medicare Other | Admitting: Vascular Surgery

## 2011-07-01 DIAGNOSIS — Z5189 Encounter for other specified aftercare: Secondary | ICD-10-CM

## 2011-07-01 DIAGNOSIS — E1165 Type 2 diabetes mellitus with hyperglycemia: Secondary | ICD-10-CM

## 2011-07-01 DIAGNOSIS — I739 Peripheral vascular disease, unspecified: Secondary | ICD-10-CM

## 2011-07-01 DIAGNOSIS — F341 Dysthymic disorder: Secondary | ICD-10-CM

## 2011-07-01 DIAGNOSIS — S78119A Complete traumatic amputation at level between unspecified hip and knee, initial encounter: Secondary | ICD-10-CM

## 2011-07-01 DIAGNOSIS — L98499 Non-pressure chronic ulcer of skin of other sites with unspecified severity: Secondary | ICD-10-CM

## 2011-07-01 DIAGNOSIS — I10 Essential (primary) hypertension: Secondary | ICD-10-CM

## 2011-07-01 LAB — URINALYSIS, ROUTINE W REFLEX MICROSCOPIC
Glucose, UA: NEGATIVE mg/dL
Protein, ur: 100 mg/dL — AB
Urobilinogen, UA: 0.2 mg/dL (ref 0.0–1.0)

## 2011-07-01 LAB — URINE MICROSCOPIC-ADD ON

## 2011-07-01 LAB — GLUCOSE, CAPILLARY: Glucose-Capillary: 179 mg/dL — ABNORMAL HIGH (ref 70–99)

## 2011-07-02 LAB — GLUCOSE, CAPILLARY: Glucose-Capillary: 113 mg/dL — ABNORMAL HIGH (ref 70–99)

## 2011-07-02 NOTE — Discharge Summary (Signed)
[name], [name] NO.:  [id number]  MEDICAL RECORD NO.:  [id number]  LOCATION:  2040                         FACILITY:  [facility]  PHYSICIAN:  Larina Earthly, M.D.    DATE OF BIRTH:  January 25, 1926  DATE OF ADMISSION:  06/19/2011 DATE OF DISCHARGE:                        DISCHARGE SUMMARY - REFERRING   ADMIT DIAGNOSIS:  Gangrene with nonhealing of a right first toe amputation site.  PAST MEDICAL HISTORY AND DISCHARGE DIAGNOSES: 1. Nonhealing right great toe amputation site, status post right above-     knee amputation. 2. Hypertension. 3. Coronary artery disease. 4. Diabetes mellitus. 5. History of breast cancer. 6. Obesity. 7. Diabetic neuropathy. 8. Degenerative joint disease.  BRIEF HISTORY:  The patient is an 77 year old female who has had multiple vascular procedures in the past including a left above-knee amputation.  The patient recently underwent a right great toe amputation secondary to ischemia.  This was performed by Dr. Myra Gianotti and the patient understood that this was a last ditch effort for limb salvage. She presented to the office for evaluation on June 16, 2011 with a nonhealing wound with gangrene of the skin flaps.  Dr. Hart Rochester evaluated the patient and secondary to these findings and the patient's severe peripheral vascular disease was scheduled for a right above knee amputation.  HOSPITAL COURSE:  The patient was admitted and taken to the OR on June 19, 2011 for a right above-the-knee amputation.  The patient tolerated the procedure well and was hemodynamically stable immediately postoperatively.  She was transferred from the OR to the Post Anesthesia Care Unit in stable condition.  She was extubated without complication and woke up from anesthesia neurologically intact.  The patient's postoperative course progressed as expected.  She initially had low urine output and she is incontinent.  This was difficult to assess, therefore, a Foley  catheter was placed.  The patient's urine output has been adequate since that time.  Physical Therapy has worked with the patient and she is progressing nicely.  She was also evaluated by the Inpatient Rehab Service and felt to be a good candidate.   On June 24, 2011, the patient is without complaint.  Her Foley catheter is still in place and this was discontinued today.  She is afebrile with stable vital signs.  PHYSICAL EXAMINATION:  The right above-the-knee amputation is healing well.  The incision is clean, dry and intact and there is good movement with no evidence of contracture.  The patient is in stable condition and doing well with physical therapy. She will transfer to rehab either today or tomorrow pending bed availability.  LABORATORY DATA:  CBC and BMP on June 24, 2011, white count 8.3, hemoglobin 9.5, hematocrit 28.5 and platelets 292.  Sodium 135, potassium 4.3, BUN 21 and creatinine 1.58.  DISCHARGE INSTRUCTIONS:  The patient will follow up with Dr. Arbie Cookey approximately 4 weeks after procedure for staple removal and evaluation. The office will contact the patient and her family with a date and time of that appointment.  Physical Therapy will be continued per rehab doctor instructions.  The right above-the-knee amputation should be cleaned daily with soap and water and a loose  Ace wrap maybe applied as necessary.  DISCHARGE MEDICATIONS: 1. Citalopram 100 mg p.o. daily. 2. Amlodipine 10 mg daily. 3. Colace 100 mg p.o. daily. 4. Trusopt 1 drop b.i.d. 5. Lovenox 40 mg subcu q.24. 6. Lasix 20 mg daily. 7. Insulin. 8. NovoLog 15 units t.i.d. 9. Multivitamin daily. 10.Protonix 40 mg daily. 11.Pilocarpine 1 drop right eye b.i.d. 12.Timolol 1 drop O.P. b.i.d. 13.Tylenol 325 mg 1-2 q.4-6 h. p.r.n. pain. 14.Oxycodone 5 mg 1-2 p.o. q.4-6 h. p.r.n. pain. 15.Ambien 5 mg p.o. nightly p.r.n.     Pecola Leisure, PA   ______________________________ Larina Earthly,  M.D.    AY/MEDQ  D:  06/24/2011  T:  06/24/2011  Job:  [id number]  Electronically Signed by Pecola Leisure PA on 06/26/2011 01:31:02 PM Electronically Signed by Tawanna Cooler Elianie Hubers M.D. on 07/02/2011 07:46:52 AM

## 2011-07-02 NOTE — Op Note (Signed)
  NAMEFRANCESCA, [name] NO.:  [id number]  MEDICAL RECORD NO.:  [id number]  LOCATION:  2040                         FACILITY:  [facility]  PHYSICIAN:  Larina Earthly, M.D.    DATE OF BIRTH:  January 25, 1926  DATE OF PROCEDURE:  06/19/2011 DATE OF DISCHARGE:                              OPERATIVE REPORT   PREOPERATIVE DIAGNOSIS:  Gangrene, right foot.  POSTOPERATIVE DIAGNOSIS:  Gangrene, right foot.  PROCEDURE:  Right above-knee amputation.  SURGEON:  Larina Earthly, MD  ASSISTANT:  Newton Pigg, PA-C  ANESTHESIA:  Attempted spinal followed by general endotracheal.  COMPLICATIONS:  None.  DISPOSITION:  To recovery room, stable.  PROCEDURE IN DETAIL:  The patient was taken to the operating room and placed supine on the operating table.  The right leg was prepped and draped in the usual sterile fashion.  A fishmouth incision was made distally just above the knee and carried down through the subcutaneous fat and fascia and muscle down to the level of the femur.  The patient had a patent vein and peroneal bypass.  This was identified, was doubly ligated proximally and distally, and divided.  The neurovascular bundle was identified.  The superficial femoral artery was chronically occluded.  It was ligated and divided.  Superficial femoral veins were also ligated and divided.  The periosteum was elevated off the femur and the femur was divided with a giggly saw proximal to the skin incision. The bone edges were smoothed with a bone rasp.  The fascia was passed off the field.  The wound was copiously irrigated with saline. Hemostasis was obtained with electrocautery.  The posterior fascia was anastomosed to the anterior fascia with 0 Vicryl figure-of-eight sutures.  The skin was closed with skin clips.  Sterile dressing was applied.  The patient was taken to the recovery room in stable condition.    Larina Earthly, M.D.    TFE/MEDQ  D:  06/19/2011  T:   06/20/2011  Job:  [id number]  cc:   Quita Skye. Hart Rochester, M.D.  Electronically Signed by Tawanna Cooler EARLY M.D. on 07/02/2011 07:46:49 AM

## 2011-07-03 LAB — URINE CULTURE

## 2011-07-06 ENCOUNTER — Ambulatory Visit: Payer: Medicare Other | Admitting: Vascular Surgery

## 2011-07-16 NOTE — Progress Notes (Deleted)
Subjective:     Patient ID: [name], female   DOB: 03/01/1926, 77 y.o.   MRN: [id number]  HPI   Review of Systems  Constitutional: Negative for fever, chills and appetite change.  Respiratory: Negative for cough, chest tightness, shortness of breath and wheezing.   Cardiovascular: Negative for chest pain, palpitations and leg swelling.  Gastrointestinal: Negative for nausea, vomiting, diarrhea, constipation and blood in stool.  Genitourinary: Negative for dysuria, frequency and hematuria.  Musculoskeletal: Negative for myalgias and arthralgias.  Skin: Negative for rash and wound.  Neurological: Negative for dizziness, seizures, speech difficulty, weakness, numbness and headaches.  Hematological: Does not bruise/bleed easily.  Psychiatric/Behavioral: Negative for confusion.       Objective:   Physical Exam  Constitutional: She is oriented to person, place, and time. She appears well-developed and well-nourished.  HENT:  Head: Normocephalic and atraumatic.  Neck: Neck supple. No JVD present. No thyromegaly present.  Cardiovascular: Normal rate, regular rhythm and normal heart sounds.  Exam reveals no friction rub.   No murmur heard. Pulmonary/Chest: Breath sounds normal. She has no wheezes. She has no rales.  Abdominal: Soft. Bowel sounds are normal. There is no tenderness.       I do not palpate an aneurysm.  Musculoskeletal: Normal range of motion. She exhibits no edema.  Lymphadenopathy:    She has no cervical adenopathy.  Neurological: She is alert and oriented to person, place, and time. She has normal strength. No sensory deficit.  Skin: No lesion and no rash noted.  Psychiatric: She has a normal mood and affect.       Assessment:     ***    Plan:     ***

## 2011-07-16 NOTE — Progress Notes (Deleted)
Subjective:     Patient ID: [name], female   DOB: [birth year], 77 y.o.   MRN: [id number]  HPI  Past Medical History  Diagnosis Date  . Diabetes mellitus   . Hypertension   . Hyperlipidemia   . GERD (gastroesophageal reflux disease)   . Arthritis   . Anxiety   . Depression   . Psoriasis   . CHF (congestive heart failure)   . CAD (coronary artery disease)   . Cancer     Left Breast  . Myocardial infarction 1985  . History of detached retina repair     Bilateral    No family history on file.  History  Substance Use Topics  . Smoking status: Never Smoker   . Smokeless tobacco: Not on file  . Alcohol Use: No    Allergies  Allergen Reactions  . Aspirin   . Atorvastatin   . Codeine   . Nitrofurantoin     REACTION: Trouble breathing, and swallowing.  Marland Kitchen Penicillins   . Rosuvastatin   . Sulfonamide Derivatives     Current Outpatient Prescriptions  Medication Sig Dispense Refill  . allopurinol (ZYLOPRIM) 100 MG tablet Take 100 mg by mouth daily.        Marland Kitchen amLODipine (NORVASC) 10 MG tablet Take 10 mg by mouth daily.        . Calcium Carbonate Antacid (ANTACID PO) Take by mouth.        . dorzolamide-timolol (COSOPT) 22.3-6.8 MG/ML ophthalmic solution Apply 1 drop to eye 2 (two) times daily.        . furosemide (LASIX) 20 MG tablet Take 20 mg by mouth daily.        . insulin glargine (LANTUS) 100 UNIT/ML injection Inject into the skin at bedtime.        . insulin lispro (HUMALOG) 100 UNIT/ML injection Inject into the skin as directed.        . Multiple Vitamin (MULTIVITAMIN) tablet Take 1 tablet by mouth daily.        . pantoprazole (PROTONIX) 40 MG tablet Take 40 mg by mouth daily.        . pilocarpine (PILOCAR) 0.5 % ophthalmic solution Apply 1-2 drops to eye 4 (four) times daily.        Bertram Gala Glycol-Propyl Glycol (SYSTANE OP) Apply to eye as needed.        . travoprost, benzalkonium, (TRAVATAN) 0.004 % ophthalmic solution Apply 1 drop to eye at bedtime.           Review of Systems  Constitutional: Negative for fever and chills.  Respiratory: Negative for chest tightness and shortness of breath.    Past Medical History  Diagnosis Date  . Diabetes mellitus   . Hypertension   . Hyperlipidemia   . GERD (gastroesophageal reflux disease)   . Arthritis   . Anxiety   . Depression   . Psoriasis   . CHF (congestive heart failure)   . CAD (coronary artery disease)   . Cancer     Left Breast  . Myocardial infarction 1985  . History of detached retina repair     Bilateral    History  Substance Use Topics  . Smoking status: Never Smoker   . Smokeless tobacco: Not on file  . Alcohol Use: No    No family history on file.  Allergies  Allergen Reactions  . Aspirin   . Atorvastatin   . Codeine   . Nitrofurantoin     REACTION: Trouble  breathing, and swallowing.  Marland Kitchen Penicillins   . Rosuvastatin   . Sulfonamide Derivatives     Current outpatient prescriptions:allopurinol (ZYLOPRIM) 100 MG tablet, Take 100 mg by mouth daily.  , Disp: , Rfl: ;  amLODipine (NORVASC) 10 MG tablet, Take 10 mg by mouth daily.  , Disp: , Rfl: ;  Calcium Carbonate Antacid (ANTACID PO), Take by mouth.  , Disp: , Rfl: ;  dorzolamide-timolol (COSOPT) 22.3-6.8 MG/ML ophthalmic solution, Apply 1 drop to eye 2 (two) times daily.  , Disp: , Rfl:  furosemide (LASIX) 20 MG tablet, Take 20 mg by mouth daily.  , Disp: , Rfl: ;  insulin glargine (LANTUS) 100 UNIT/ML injection, Inject into the skin at bedtime.  , Disp: , Rfl: ;  insulin lispro (HUMALOG) 100 UNIT/ML injection, Inject into the skin as directed.  , Disp: , Rfl: ;  Multiple Vitamin (MULTIVITAMIN) tablet, Take 1 tablet by mouth daily.  , Disp: , Rfl: ;  pantoprazole (PROTONIX) 40 MG tablet, Take 40 mg by mouth daily.  , Disp: , Rfl:  pilocarpine (PILOCAR) 0.5 % ophthalmic solution, Apply 1-2 drops to eye 4 (four) times daily.  , Disp: , Rfl: ;  Polyethyl Glycol-Propyl Glycol (SYSTANE OP), Apply to eye as needed.  , Disp:  , Rfl: ;  travoprost, benzalkonium, (TRAVATAN) 0.004 % ophthalmic solution, Apply 1 drop to eye at bedtime.  , Disp: , Rfl:   There were no vitals filed for this visit.  There is no height or weight on file to calculate BMI.           Objective:   Physical Exam  Constitutional: She is oriented to person, place, and time. She appears well-developed and well-nourished.  HENT:  Head: Normocephalic and atraumatic.  Neck: Neck supple. No JVD present. No thyromegaly present.  Cardiovascular: Normal rate, regular rhythm and normal heart sounds.  Exam reveals no friction rub.   No murmur heard. Pulmonary/Chest: Breath sounds normal. She has no wheezes. She has no rales.  Abdominal: Soft. Bowel sounds are normal. There is no tenderness.  Musculoskeletal: She exhibits no edema.  Lymphadenopathy:    She has no cervical adenopathy.  Neurological: She is alert and oriented to person, place, and time.  Skin: No rash noted.  Psychiatric: She has a normal mood and affect.   The patient appears their stated age.   There were no vitals filed for this visit.  There is no height or weight on file to calculate BMI.       Assessment:     ***    Plan:     ***

## 2011-07-18 ENCOUNTER — Encounter: Payer: Self-pay | Admitting: Vascular Surgery

## 2011-07-18 NOTE — Progress Notes (Deleted)
Subjective:     Patient ID: [name], female   DOB: 09-03-1926, 77 y.o.   MRN: [id number]  HPI    Past Medical History  Diagnosis Date  . Diabetes mellitus   . Hypertension   . Hyperlipidemia   . GERD (gastroesophageal reflux disease)   . Arthritis   . Anxiety   . Depression   . Psoriasis   . CHF (congestive heart failure)   . CAD (coronary artery disease)   . Cancer     Left Breast  . History of detached retina repair     Bilateral    No family history on file.  History  Substance Use Topics  . Smoking status: Never Smoker   . Smokeless tobacco: Not on file  . Alcohol Use: No    Allergies  Allergen Reactions  . Aspirin   . Atorvastatin   . Codeine   . Nitrofurantoin     REACTION: Trouble breathing, and swallowing.  Marland Kitchen Penicillins   . Rosuvastatin   . Sulfonamide Derivatives     Current Outpatient Prescriptions  Medication Sig Dispense Refill  . allopurinol (ZYLOPRIM) 100 MG tablet Take 100 mg by mouth daily.        Marland Kitchen amLODipine (NORVASC) 10 MG tablet Take 10 mg by mouth daily.        . Calcium Carbonate Antacid (ANTACID PO) Take by mouth.        . dorzolamide-timolol (COSOPT) 22.3-6.8 MG/ML ophthalmic solution Apply 1 drop to eye 2 (two) times daily.        . furosemide (LASIX) 20 MG tablet Take 20 mg by mouth daily.        . insulin glargine (LANTUS) 100 UNIT/ML injection Inject into the skin at bedtime.        . insulin lispro (HUMALOG) 100 UNIT/ML injection Inject into the skin as directed.        . Multiple Vitamin (MULTIVITAMIN) tablet Take 1 tablet by mouth daily.        . pantoprazole (PROTONIX) 40 MG tablet Take 40 mg by mouth daily.        . pilocarpine (PILOCAR) 0.5 % ophthalmic solution Apply 1-2 drops to eye 4 (four) times daily.        Bertram Gala Glycol-Propyl Glycol (SYSTANE OP) Apply to eye as needed.        . travoprost, benzalkonium, (TRAVATAN) 0.004 % ophthalmic solution Apply 1 drop to eye at bedtime.          Review of Systems    Objective:   Physical Exam  Constitutional: She is oriented to person, place, and time.  Neck: Neck supple. No JVD present. No thyromegaly present.  Cardiovascular: Normal rate, regular rhythm and normal heart sounds.  Exam reveals no friction rub.   No murmur heard. Pulmonary/Chest: Breath sounds normal. She has no wheezes. She has no rales.  Abdominal: Soft. Bowel sounds are normal. There is no tenderness.       No palpable aneurysm detected.  Musculoskeletal: She exhibits no edema.  Lymphadenopathy:    She has no cervical adenopathy.  Neurological: She is alert and oriented to person, place, and time. She has normal strength. No sensory deficit.  Skin: No lesion and no rash noted.   There were no vitals filed for this visit.  There is no height or weight on file to calculate BMI.       Assessment:     ***    Plan:     ***

## 2011-07-19 NOTE — Progress Notes (Deleted)
Subjective:     Patient ID: [name], female   DOB: Jan 14, 1926, 76 y.o.   MRN: [id number]  HPI  History  Substance Use Topics  . Smoking status: Never Smoker   . Smokeless tobacco: Not on file  . Alcohol Use: No    Review of Systems     Objective:     Physical Exam There were no vitals filed for this visit.  There is no height or weight on file to calculate BMI.       Assessment:     ***    Plan:     ***

## 2011-07-22 ENCOUNTER — Encounter: Payer: Self-pay | Admitting: Vascular Surgery

## 2011-07-22 ENCOUNTER — Ambulatory Visit (INDEPENDENT_AMBULATORY_CARE_PROVIDER_SITE_OTHER): Payer: Medicare Other | Admitting: Vascular Surgery

## 2011-07-22 VITALS — BP 150/72 | HR 80 | Temp 97.8°F | Resp 22

## 2011-07-22 DIAGNOSIS — Z89619 Acquired absence of unspecified leg above knee: Secondary | ICD-10-CM

## 2011-07-22 DIAGNOSIS — S78119A Complete traumatic amputation at level between unspecified hip and knee, initial encounter: Secondary | ICD-10-CM

## 2011-07-22 NOTE — Progress Notes (Signed)
[name] is a 77 y.o. female patient. 1. History of above knee amputation    Past Medical History  Diagnosis Date  . Diabetes mellitus   . Hypertension   . Hyperlipidemia   . GERD (gastroesophageal reflux disease)   . Arthritis   . Anxiety   . Depression   . Psoriasis   . CHF (congestive heart failure)   . CAD (coronary artery disease)   . Cancer     Left Breast  . History of detached retina repair     Bilateral   Current Outpatient Prescriptions  Medication Sig Dispense Refill  . allopurinol (ZYLOPRIM) 100 MG tablet Take 100 mg by mouth daily.        Marland Kitchen amLODipine (NORVASC) 10 MG tablet Take 10 mg by mouth daily.        . Calcium Carbonate Antacid (ANTACID PO) Take by mouth.        . dorzolamide-timolol (COSOPT) 22.3-6.8 MG/ML ophthalmic solution Apply 1 drop to eye 2 (two) times daily.        . furosemide (LASIX) 20 MG tablet Take 20 mg by mouth daily.        . insulin glargine (LANTUS) 100 UNIT/ML injection Inject into the skin at bedtime.        . insulin lispro (HUMALOG) 100 UNIT/ML injection Inject into the skin as directed.        . Multiple Vitamin (MULTIVITAMIN) tablet Take 1 tablet by mouth daily.        . pantoprazole (PROTONIX) 40 MG tablet Take 40 mg by mouth daily.        . pilocarpine (PILOCAR) 0.5 % ophthalmic solution Apply 1-2 drops to eye 4 (four) times daily.        Bertram Gala Glycol-Propyl Glycol (SYSTANE OP) Apply to eye as needed.        . travoprost, benzalkonium, (TRAVATAN) 0.004 % ophthalmic solution Apply 1 drop to eye at bedtime.         Allergies  Allergen Reactions  . Aspirin   . Atorvastatin   . Codeine   . Nitrofurantoin     REACTION: Trouble breathing, and swallowing.  Marland Kitchen Penicillins   . Rosuvastatin   . Sulfonamide Derivatives    Active Problems:  * No active hospital problems. *   Blood pressure 150/72, pulse 80, temperature 97.8 F (36.6 C), temperature source Oral, resp. rate 22.  Subjective Objective Assessment &  Plan  Sibyl Mikula S 07/22/2011

## 2011-07-22 NOTE — Discharge Summary (Signed)
[name], [name] NO.:  [id number]  MEDICAL RECORD NO.:  [id number]  LOCATION:  4005                         FACILITY:  [facility]  PHYSICIAN:  Ranelle Oyster, M.D.DATE OF BIRTH:  [birth year]  DATE OF ADMISSION:  06/24/2011 DATE OF DISCHARGE:  07/02/2011                              DISCHARGE SUMMARY   DISCHARGE DIAGNOSES: 1. Right above-knee amputation, June 19, 2011, secondary to gangrenous     changes. 2. Subcutaneous Lovenox for deep vein thrombosis prophylaxis. 3. Pain management. 4. History of left above-knee amputation, December 2010. 5. Anxiety. 6. Anemia. 7. Urinary tract infection. 8. Insulin-dependent diabetes mellitus. 9. Hypertension. 10.Gout. 11.Glaucoma. 12.Chronic renal insufficiency with creatinine of 1.4.  This is an 77 year old black female with history of left above-knee amputation in December 2010, discharged home, admitted on June 19, 2011, with gangrenous right foot with noted history of peripheral vascular disease and multiple revascularization procedures.  Underwent right above-knee amputation on June 19, 2011, per Dr. Arbie Cookey.  Subcutaneous Lovenox for deep vein thrombosis prophylaxis.  Bouts of urinary retention with Foley catheter tube removed on June 21, 2011.  Bouts of anxiety, needed much emotional support.  Postoperative anemia 9.5 and monitored.  Placed empirically on Levaquin on June 19, 2011, for lung congestion and slow wean of her oxygen.  She was total assist for transfers.  She was admitted for comprehensive rehab program.  PAST MEDICAL HISTORY:  See discharge diagnoses.  No alcohol or tobacco.  ALLERGIES:  SULFA, PENICILLIN, CODEINE, ASPIRIN, OXYCODONE, MORPHINE SULFATE, and DARVOCET.  SOCIAL HISTORY:  He lives with family, assistance as needed, one-level home, no steps to entry.  FUNCTIONAL HISTORY:  Prior to admission was independent with a wheelchair.  She did not have a prosthesis.  Functional status  upon admission to rehab services was moderate assist bed mobility, total assist transfers, occupational therapy was not tested.  MEDICATIONS PRIOR TO ADMISSION: 1. Allopurinol daily. 2. Multiple eye drops. 3. Norvasc 10 mg daily. 4. Humalog insulin three times daily. 5. Lantus insulin 55 units daily. 6. Lasix 20 mg daily. 7. Protonix 40 mg daily.  PHYSICAL EXAMINATION:  VITAL SIGNS:  Blood pressure 130/46, pulse 76, temperature 98.5, respirations 20. GENERAL:  This is an alert female, anxious, oriented x3.  Deep tendon reflexes hypoactive.  Sensation decreased to light touch distally.  Left above-knee amputation site well healed.  Clips in place to right above- knee amputation site with dry dressing. LUNGS:  Clear to auscultation. CARDIAC:  Regular rate and rhythm. ABDOMEN:  Soft, nontender.  Good bowel sounds.  REHABILITATION HOSPITAL COURSE:  The patient was admitted to inpatient rehab services with therapies initiated on a 3-hour daily basis consisting of physical therapy, occupational therapy, and rehabilitation nursing.  The following issues were addressed during the patient's rehabilitation stay.  Pertaining to [name]'s right above-knee amputation on June 19, 2011, secondary to gangrenous changes, surgical site healing nicely, staples intact, she would follow up with Dr. Tawanna Cooler early of Vascular Surgery.  She had undergone a recent left above-knee amputation in December 2010, site was well healed.  She did not have a prosthesis for this.  She will be seen at  her discretion in [facility].  She remained on subcutaneous Lovenox for deep vein thrombosis prophylaxis throughout her rehab course.  Pain management with the use of hydrocodone limited monitoring of mental status and any lethargy.  Ongoing bouts of anxiety with the addition of Xanax and good results.  She did have some initial urinary retention.  She was voiding better as her mobility improved.  A  urinalysis study was completed on June 25, 2011, showing no growth, repeated on July 01, 2011, showing positive nitrite, 21-50 wbc's.  She had been placed empirically on Cipro on June 30, 2011, and monitored.  She remained afebrile.  Her diabetes mellitus with hemoglobin A1c of 8.7 and monitored, her NovoLog insulin had been resumed.  Blood pressure with no orthostatic changes.  She remained on Norvasc as well as Lasix.  Chronic renal insufficiency with monitoring of input and output.  Latest BUN of 1.29 with baseline of 1.4.  The patient received weekly collaborative interdisciplinary team conferences to discuss estimated length of stay, family teaching, and any barriers to discharge.  She was needing ongoing cues for her therapy and mobility.  She was wheelchair to bed transfers total assist, total assist for anterior-posterior technique of transfers.  The patient was participating 60% but she did recover to progress to minimal assist of one.  She was receiving full family teaching, home health therapies would be ongoing.  Her strength and endurance did improve throughout her rehab course but she did remain easily fatigued.  LATEST LABORATORY DATA:  Sodium 138, potassium 4.2, BUN 21, creatinine 1.29.  Hemoglobin of 10.6, hematocrit 31, platelets 306,000.  DISCHARGE MEDICATIONS: 1. Allopurinol 100 mg daily. 2. Norvasc 10 mg daily. 3. Cipro 250 mg twice daily x7 days with urinalysis sensitivities     pending true. 4. Trusopt ophthalmic solution 1 drop both eyes twice daily. 5. Lasix 20 mg daily. 6. NovoLog insulin 5 units three times daily. 7. Lantus insulin 55 units subcutaneously daily. 8. Multivitamin daily. 9. Protonix 40 mg daily. 10.Pilocarpine ophthalmic solution 1 drop both eyes twice daily. 11.Timoptic ophthalmic solution 1 drop both eyes twice daily. 12.Xanax 0.25 mg three times daily as needed. 13.Hydrocodone 0.5-1 tablet every 6 hours as needed pain.  Her diet was a  diabetic diet.  SPECIAL INSTRUCTIONS:  Follow up with Dr. Arbie Cookey for removal of staples in 2 weeks.  Home health therapies as recommended per rehab services. She would follow up with Dr. Faith Rogue at the outpatient rehab service office as needed.     Mariam Dollar, P.A.   ______________________________ Ranelle Oyster, M.D.    DA/MEDQ  D:  07/01/2011  T:  07/01/2011  Job:  [id number]  cc:   Angus G. Renard Matter, MD Larina Earthly, M.D.  Electronically Signed by Mariam Dollar P.A. on 07/01/2011 02:45:32 PM Electronically Signed by Faith Rogue M.D. on 07/22/2011 09:45:40 AM

## 2011-07-22 NOTE — Progress Notes (Signed)
Subjective:     Patient ID: [name], female   DOB: 09/15/1926, 76 y.o.   MRN: [id number]  HPI  This patient had a right above-the-knee amputation by Dr. Arbie Cookey on 06/19/2011. She comes in for a one-month followup visit. She has had some phantom pain but otherwise has been doing well. She has had no fever or chills.  Review of Systems  Constitutional: Negative for fever and chills.  Respiratory: Negative for chest tightness and shortness of breath.        Objective:   Physical Exam  Cardiovascular: Normal rate, regular rhythm and normal pulses.  Exam reveals no friction rub.   No murmur heard. Pulmonary/Chest: She has no wheezes. She has no rales.   right above-the-knee amputation site is healing nicely. There is no erythema or drainage.     Assessment:     This patient is status post right above-the-knee amputation and is now 1 month postop.    Plan:     We will remove her staples in the office today. I will see her back when necessary.

## 2011-07-22 NOTE — H&P (Signed)
NAMEFRANCIES, INCH NO.:  [id number]  MEDICAL RECORD NO.:  [id number]  LOCATION:                                 FACILITY:  PHYSICIAN:  Ranelle Oyster, M.D.DATE OF BIRTH:  1926-05-11  DATE OF ADMISSION:  06/24/2011 DATE OF DISCHARGE:                             HISTORY & PHYSICAL   PRIMARY CARE PHYSICIAN:  Angus G. McInnis, MD  HISTORY OF PRESENT ILLNESS:  This is an 77 year old African American female with history of left above-knee amputation in 2010, was admitted on June 19, 2011, after continued problems with right lower extremity. The leg could not be salvaged.  She developed gangrenous changes and ultimately underwent a right above-knee amputation on June 19, 2011, by Dr. Gretta Began.  She has been on subcu Lovenox for DVT prophylaxis.  She has had problems with urinary retention.  She had a Foley tube in until June 21, 2011.  She has had problems with anxiety.  She has had postoperative anemia, monitored only for now.  She is on empiric Levaquin for lung congestion with slow wean of oxygen.  Rehab was asked to evaluate the patient.  I saw her on June 22, 2011, and felt she could benefit from inpatient rehab stay.  REVIEW OF SYSTEMS:  Notable for the above.  She has had pain in the right leg.  She uses hydrocodone for pain, but does not tolerate very high doses of medication.  Full 12-point review is in the written H and P.  PAST MEDICAL HISTORY:  Positive for hypertension, PVD, CAD, insulin- requiring diabetes, breast cancer, gout, left above-knee amputation December 2010, chronic renal insufficiency, creatinine 1.45 at baseline, glaucoma, obesity.  FAMILY HISTORY:  Positive for CAD and diabetes.  SOCIAL HISTORY:  The patient does not drink or smoke.  She lives with family who can assist her as needed.  She has several involved members. He lives in a 1-level house with no steps to enter.  ALLERGIES:  Positive for SULFA, PENICILLIN, CODEINE,  ASPIRIN, OXYCODONE, MORPHINE, DARVOCET which are more intolerances.  The pain medications tend to cause confusion easily for her.  HOME MEDICATIONS: 1. Allopurinol. 2. Cosopt. 3. Norvasc. 4. Humalog. 5. Lantus. 6. Lasix. 7. Pilocarpine ophthalmic drops. 8. Protonix. 9. Multivitamin.  LABORATORY DATA:  Hemoglobin 9.5, BUN 21, creatinine 1.58.  PHYSICAL EXAMINATION:  VITAL SIGNS:  Blood pressure 130/46, pulse 76, respiratory rate 20, temperature 98.5. GENERAL:  The patient is generally pleasant, a bit anxious, obese. HEENT:  Pupils equally round, reactive to light.  She is edentulous, but mucosa is pink and moist. NECK:  Supple without JVD or lymphadenopathy. CHEST:  Clear to auscultation bilaterally without wheezes, rales or rhonchi. HEART:  Regular rate and rhythm without murmur, rub, or gallops. EXTREMITIES:  Showed no clubbing or cyanosis.  The patient had mild edema around the right leg.  The wound itself was intact with staples, and minimal-to-no drainage is seen.  Wound was well approximated. ABDOMEN:  Soft, nontender.  Bowel sounds are positive. NEUROLOGIC:  Cranial nerves II-XII are grossly intact.  Reflexes 1+. Sensation generally intact in all fours.  Did not notice any diminishment of her  fine touch in her hands.  Strength is 4/5 upper extremities.  She is a little bit weaker with handgrip in the 3-3+/5 range.  Right shoulder was slightly limited due to some chronic arthritis there.  Lower extremity strength is 3-4/5 hip flexion and extension on the left, and 1-2/5 on the right hip with pain inhibition noted.  Right shoulder was limited to about 80 degrees of passive abduction due to chronic shoulder changes noted.  The patient did have some pain with rotational movements in rotator cuff maneuvers.  POSTADMISSION PHYSICIAN EVALUATION: 1. Functional deficit secondary to new right above-knee amputation in     the setting of prior left above-knee amputation. 2. The  patient is admitted to receive collaborative interdisciplinary     care between the physiatrist rehab nursing staff therapy team. 3. The patient's level of medical complexity and substantial therapy     needs in context of that medical necessity cannot be provided at     lesser intensity of care. 4. The patient has experienced substantial functional loss from her     baseline.  Premorbidly, she was independent with a wheelchair.  She     was not using a prosthesis.  Currently, mod assist bed mobility     75%, total assist transfers, and OT has not evaluated the patient     yet.  Judging by the patient's diagnosis, physical exam and     functional history, she has potential for functional progress which     will result in measurable gains while inpatient rehab.  Gains will     be of substantial and practical use upon discharge to home in     facilitating mobility and self care.  Interim changes since my     consult are detailed above. 5. Physiatrist will provide 24-hour management and medical needs as     well as oversight of the therapy, plans/treatment, provide guidance     as appropriate guarding interaction of two.  Medical problem list     and plan are below. 6. A 24-rehab nursing team will assist in manage the patient's skin     care needs as well as bowel and bladder function, safety awareness,     psychological support, integration of therapy concepts and     techniques. 7. PT will assess and treat for lower extremity strength, range of     motion, functional mobility, transfer techniques, family and     patient education, posture, conservation measures, etc., goals min     assist. 8. OT will assess and treat for upper extremity use ADLs, adaptive     techniques, equipment, functional mobility, safety, upper extremity     strength, range of motion with goals modified independent to min     assist. 9. Case management social worker will assess and treat for     psychosocial  issues and discharge planning. 10.Team conference will be held weekly to assess progress towards     goals and to determine barriers at discharge. 11.The patient demonstrated sufficient medical stability and exercise     capacity to tolerate at least hours therapy per day at least 5 days     per week. 12.Estimated length of stay is 2 weeks.  Focus will be transferring     and family education.  The patient will reassurance at times due to     anxiety.  She does seem motivated overall.  MEDICAL PROBLEM LIST AND PLAN: 1. DVT prophylaxis:  Subcu Lovenox.  We will follow platelets as well     as blood gas.  No active signs of bleeding at this point. 2. Pain management:  We will utilize Tylenol generally as well as some     modalities if able.  Limit hydrocodone due to the patient's     cognitive side effects. 3. Mood:  Team will provide ego support.  I tried to reassure the     patient today and that we will work with her on specific needs.     She seemed to feel better after our discussion today.  We will use     Xanax p.r.n. for anxiety as needed. 4. Anemia:  We will check CBC on admission.  No need for transfusion     at this point.  No active signs of bleeding. 5. Urine retention:  Check urinalysis and culture on admit.  Check     PVRs after each void.  The patient on empiric Levaquin at this     point.  If urine is clean, we will discontinue Levaquin. 6. Insulin-requiring diabetes:  Lantus insulin 55 units daily as well     as sliding scale insulin on board.  Her hemoglobin A1c is 8.7, so     obviously sugars were not under good control prior to arrival. 7. Blood pressure control:  Norvasc and Lasix.  We will follow with     increased activity as well as with pain levels. 8. Gout:  Continue allopurinol. 9. History of glaucoma. 10.History of chronic renal insufficiency.  We will check I's and O's     as well as lytes, weights, etc.     Ranelle Oyster, M.D.     ZTS/MEDQ   D:  06/24/2011  T:  06/24/2011  Job:  [id number]  cc:   Angus G. Renard Matter, MD Larina Earthly, M.D.  Electronically Signed by Faith Rogue M.D. on 07/22/2011 09:46:15 AM

## 2011-08-13 NOTE — Discharge Summary (Signed)
  NAME:  [name], [name]                   ACCOUNT NO.:  [id number]  MEDICAL RECORD NO.:  [id number]  LOCATION:                                 FACILITY:  PHYSICIAN:  Juleen China IV, MDDATE OF BIRTH:  31-Jul-1926  DATE OF ADMISSION: DATE OF DISCHARGE:                              DISCHARGE SUMMARY   ADMITTING DIAGNOSIS:  Nonhealing wound of the right great toe.  PAST MEDICAL HISTORY AND DISCHARGE DIAGNOSES: 1. Carotid artery disease. 2. Cerebral artery occlusion. 3. Coronary artery disease 4. Peripheral vascular disease. 5. Hypertension. 6. Breast cancer. 7. Edema. 8. Gallstones. 9. History of urinary tract infection. 10.Glaucoma. 11.Helicobacter pylori. 12.Diabetes mellitus. 13.Psoriasis. 14.Nonhealing wound of the right great toe, status post right great     toe amputation including metatarsal head.  BRIEF HISTORY:  The patient is an 77 year old female who had previously undergone left leg amputation.  She has also had a right peroneal bypass graft.  The patient is not a candidate for further revascularization and she had an infected ischemic great toe.  The patient was subsequently scheduled for amputation.  The family was aware that the patient was not a candidate for further revascularization due to her diffuse vascular disease.  They wanted to see if the toe amp would heal prior to proceeding with possible below-the-knee amputation.  HOSPITAL COURSE:  The patient was admitted and taken to the OR on June 09, 2011 for right great toe amputation including the metatarsal head. The patient tolerated the procedure well and was hemodynamically stable immediately postoperatively.  She was transferred from OR to Postanesthesia Care Unit in stable condition.  The patient was then transferred to the floor without difficulty.  The patient's postoperative course progressed as expected.  She worked with physical therapy and occupational therapy without difficulty. Weightbearing  was on the right heel only.  A Darco shoe was provided for the patient.  On postop #2, she was without complaint.  The right toe amputation site was clean with minimal bloody drainage.  The sutures were intact.  The foot was warm and there was minimal erythema on the dorsum of the foot.  The patient was felt ready for discharge home with home health RN, PT, and OT.  The patient was given specific written instructions regarding diet, activity, and would care.  She is to follow up with Dr. Myra Gianotti in approximately 2 weeks.  MEDICATIONS: 1. Levaquin 500 mg daily. 2. Ultram 50 mg 1-2 q.4-6 h. p.r.n. pain. 3. Allopurinol 100 mg daily. 4. Acetaminophen 500 mg 1 q.6 p.r.n. 5. Amlodipine 10 mg daily. 6. Aspirin 81 mg daily. 7. Cosopt 1 drop both eyes b.i.d. 8. Humalog sliding scale t.i.d. 9. Lantus 55 units subcu q.a.m. 10.Lasix 20 mg daily. 11.Multivitamin daily. 12.Pantoprazole 40 mg daily. 13.Pilocarpine 1 drop both eyes b.i.d.     Pecola Leisure, PA   ______________________________ V. Charlena Cross, MD    AY/MEDQ  D:  07/28/2011  T:  07/28/2011  Job:  [id number]  Electronically Signed by Pecola Leisure PA on 07/30/2011 01:27:54 PM Electronically Signed by Arelia Longest IV MD on 08/13/2011 12:02:39 AM

## 2011-09-08 LAB — DIFFERENTIAL
Eosinophils Absolute: 0.3
Eosinophils Relative: 4
Lymphocytes Relative: 27
Lymphs Abs: 2.1
Monocytes Relative: 9

## 2011-09-08 LAB — CBC
HCT: 37.7
MCV: 88.5
RBC: 4.26
WBC: 7.9

## 2011-09-08 LAB — BASIC METABOLIC PANEL
Chloride: 101
GFR calc Af Amer: >60
Potassium: 3.7
Sodium: 135

## 2011-09-08 LAB — APTT: aPTT: 29

## 2011-09-08 LAB — PROTIME-INR: Prothrombin Time: 13

## 2011-09-18 LAB — DIFFERENTIAL
Basophils Relative: 0
Eosinophils Absolute: 0.4
Eosinophils Relative: 6 — ABNORMAL HIGH
Lymphocytes Relative: 28
Neutrophils Relative %: 59

## 2011-09-18 LAB — BASIC METABOLIC PANEL
CO2: 28
CO2: 31
Calcium: 8.9
Calcium: 9.3
Chloride: 105
Chloride: 106
GFR calc Af Amer: >60
GFR calc Af Amer: >60
Sodium: 139
Sodium: 140

## 2011-09-18 LAB — CBC
Hemoglobin: 13.8
MCHC: 34
RBC: 4.63
WBC: 6.7

## 2011-09-18 LAB — MAGNESIUM: Magnesium: 1.9

## 2011-09-24 LAB — URINE CULTURE: Colony Count: >=100000

## 2011-09-24 LAB — URINE MICROSCOPIC-ADD ON

## 2011-09-24 LAB — URINALYSIS, ROUTINE W REFLEX MICROSCOPIC
Protein, ur: 100 — AB
Specific Gravity, Urine: 1.015
Urobilinogen, UA: 0.2

## 2011-11-04 IMAGING — CR DG CHEST 1V
1 series · 1 of 1 positions shown · non-contrast
Comparison: Chest 12/18/2009.

CLINICAL DATA: Status post fall.

CHEST - 1 VIEW

[view not recorded]
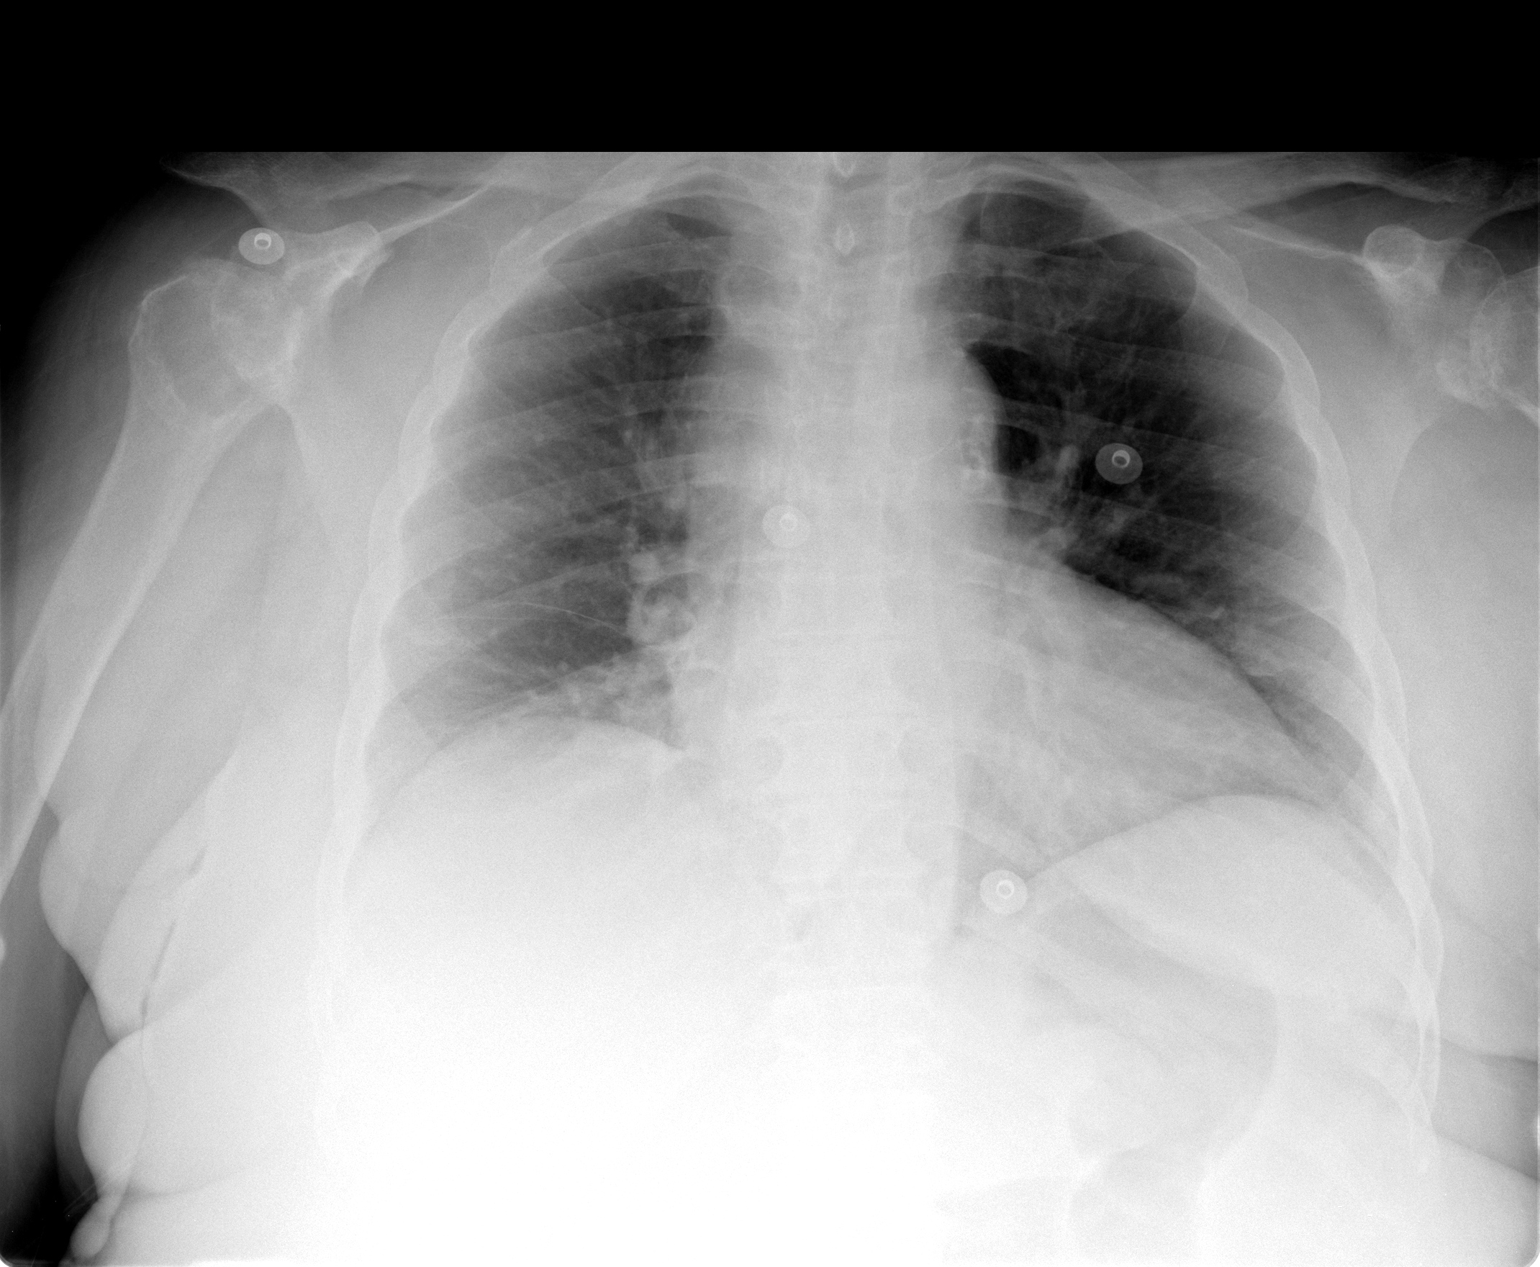

[1 of 1 positions shown; findings below may reference images not displayed]

FINDINGS: There is cardiomegaly but no pulmonary edema.  Mild right
basilar atelectasis noted.  No effusion.
IMPRESSION: Cardiomegaly without acute disease.

## 2011-11-04 IMAGING — CR DG KNEE COMPLETE 4+V*R*
4 series · 4 of 4 positions shown · non-contrast
Comparison: None.

CLINICAL DATA: Pain.

RIGHT KNEE - COMPLETE 4+ VIEW

[view not recorded (1 of 4)]
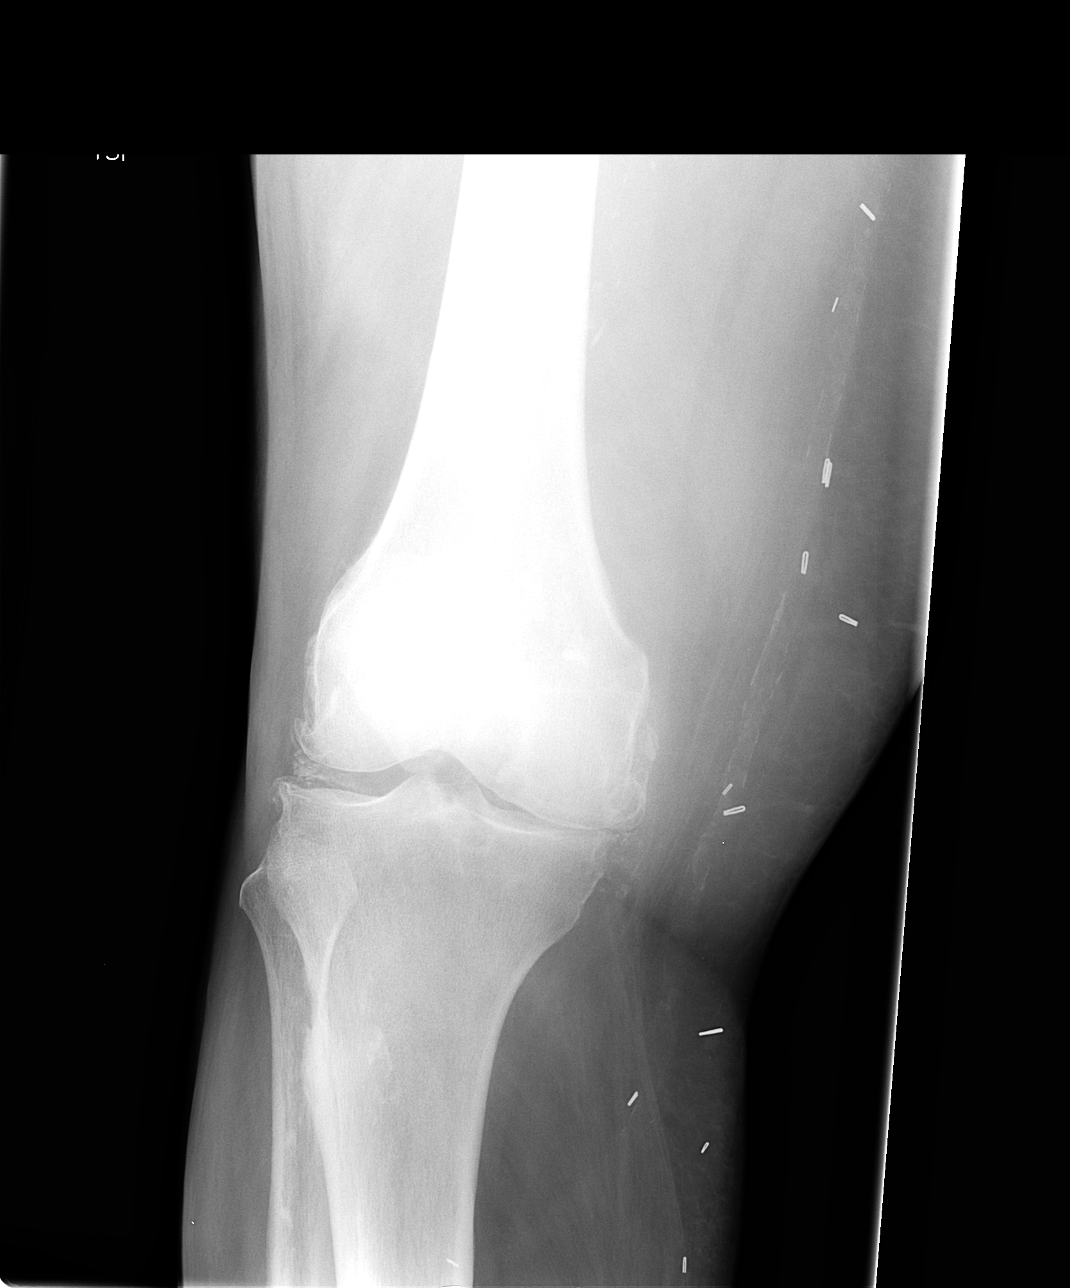

[view not recorded (2 of 4)]
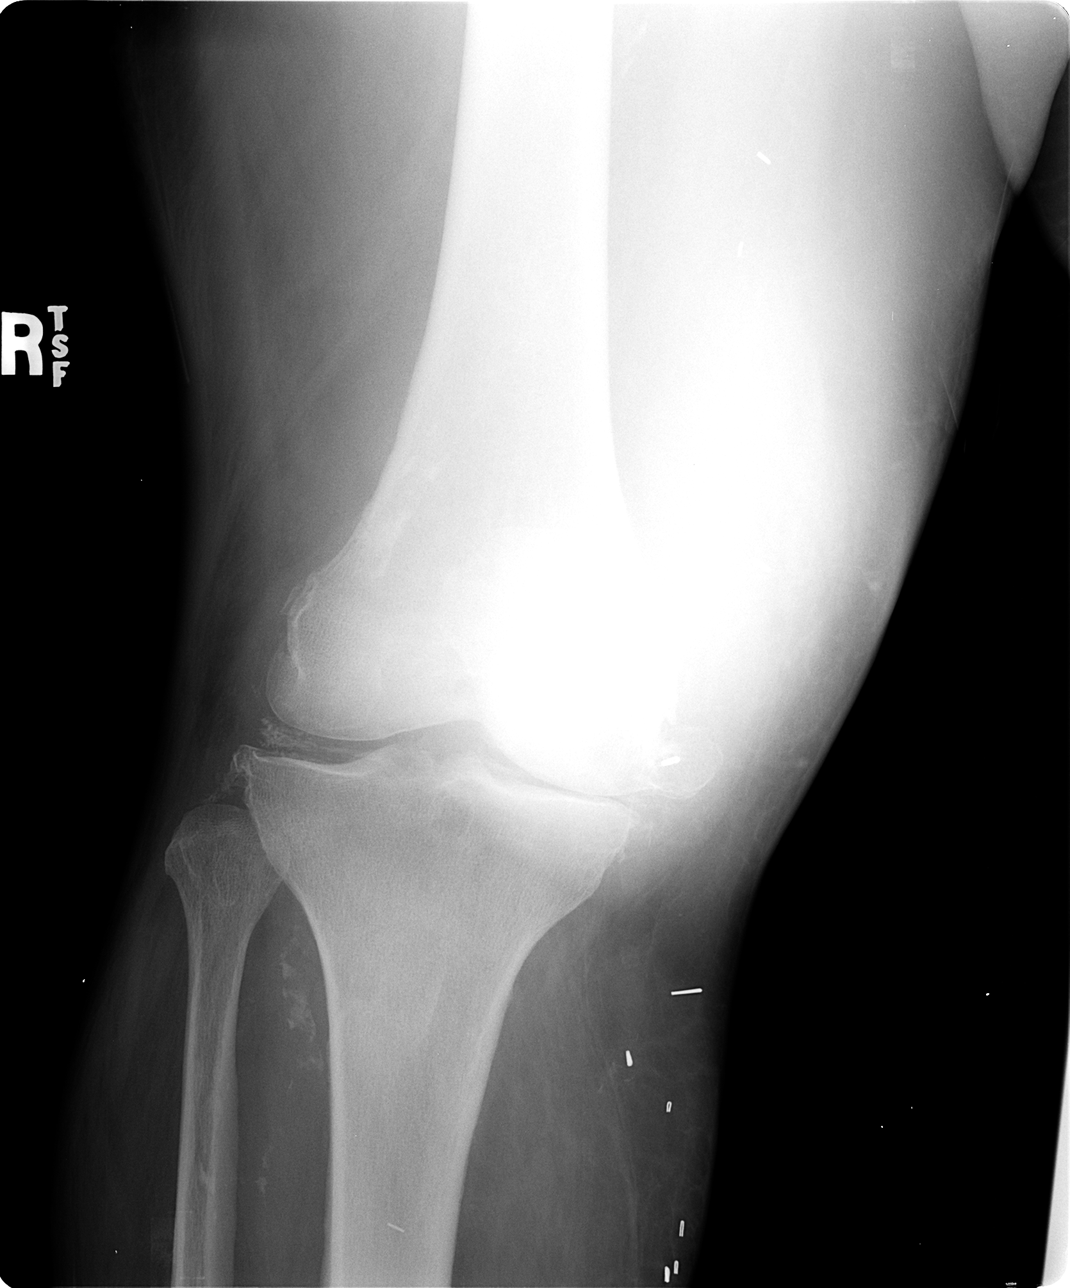

[view not recorded (3 of 4)]
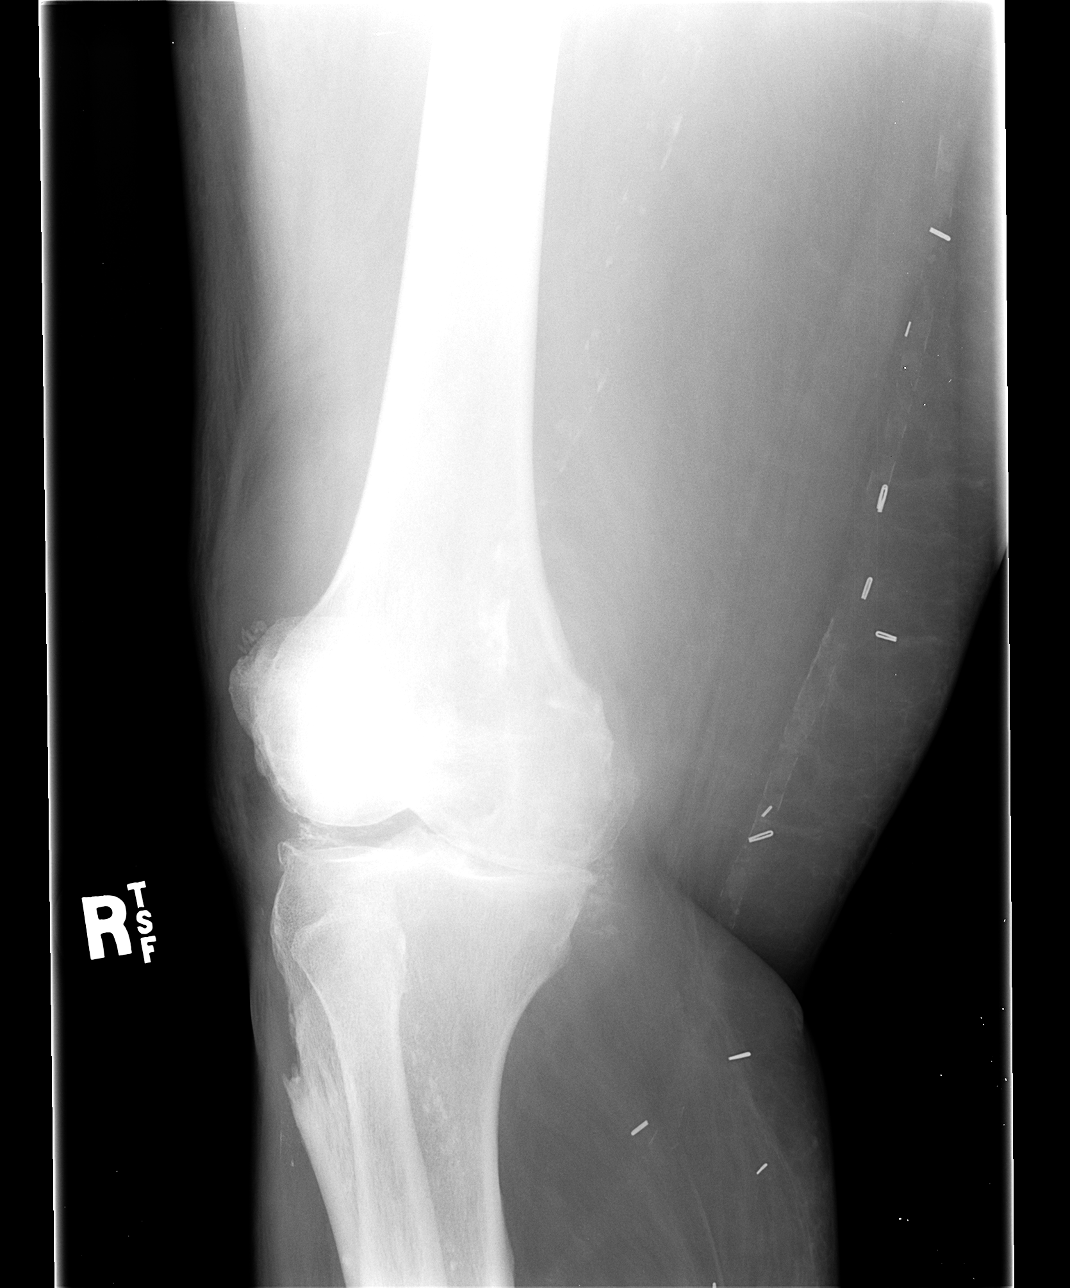

[view not recorded (4 of 4)]
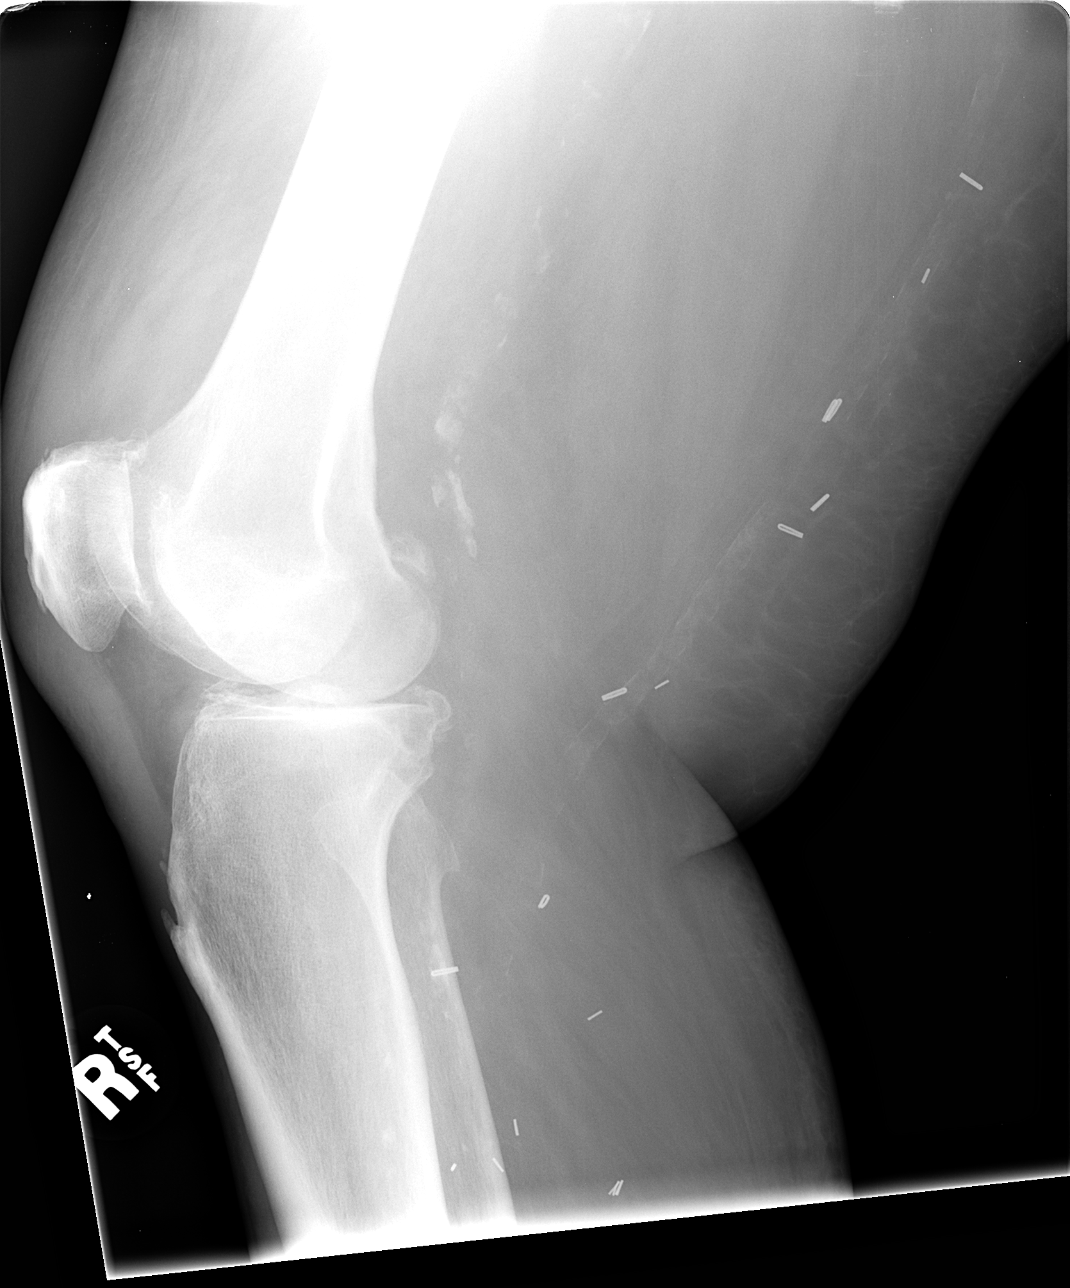

[4 of 4 positions shown; findings below may reference images not displayed]

FINDINGS: There is no acute bony or joint abnormality.  The patient
has advanced tricompartmental degenerative disease with bone-on-
bone joint space narrowing in the medial compartment.
Chondrocalcinosis is seen.  The patient has a joint effusion.
IMPRESSION: 1.  No acute finding.
2.  Advanced degenerative disease.

## 2011-11-27 ENCOUNTER — Encounter: Payer: Self-pay | Admitting: Gastroenterology

## 2011-12-30 ENCOUNTER — Encounter

## 2012-01-28 ENCOUNTER — Ambulatory Visit (INDEPENDENT_AMBULATORY_CARE_PROVIDER_SITE_OTHER): Payer: Medicare Other | Admitting: Cardiology

## 2012-01-28 ENCOUNTER — Encounter: Payer: Self-pay | Admitting: Cardiology

## 2012-01-28 VITALS — BP 151/72 | HR 76

## 2012-01-28 DIAGNOSIS — I2510 Atherosclerotic heart disease of native coronary artery without angina pectoris: Secondary | ICD-10-CM

## 2012-01-28 DIAGNOSIS — I10 Essential (primary) hypertension: Secondary | ICD-10-CM

## 2012-01-28 DIAGNOSIS — I739 Peripheral vascular disease, unspecified: Secondary | ICD-10-CM

## 2012-01-28 NOTE — Patient Instructions (Signed)
Your physician wants you to follow-up in:  12 months.  You will receive a reminder letter in the mail two months in advance. If you don't receive a letter, please call our office to schedule the follow-up appointment.   

## 2012-01-28 NOTE — Progress Notes (Signed)
HPI [name] comes in today with her daughter for followup of her history of coronary disease and peripheral vascular disease. This past summer she had additional amputation of her right lower extremity. She is still able to live at home with the help of her children.  She's always been noncompliant. Because she wasn't sleeping well, she stopped fenofibrate. She also takes aspirin when she thinks about it. She did have some bleeding from retinopathy in the past.  She is wheelchair confined. She denies any angina or chest pain. She does have some palpitations off and on during the night. She does not want to wear a monitor. In the past, she broke out severely from the patches.She denies orthopnea, or PND.  Past Medical History  Diagnosis Date  . Diabetes mellitus   . Hypertension   . Hyperlipidemia   . GERD (gastroesophageal reflux disease)   . Arthritis   . Anxiety   . Depression   . Psoriasis   . CHF (congestive heart failure)   . CAD (coronary artery disease)   . Cancer     Left Breast  . History of detached retina repair     Bilateral    Current Outpatient Prescriptions  Medication Sig Dispense Refill  . allopurinol (ZYLOPRIM) 100 MG tablet Take 100 mg by mouth daily.        Marland Kitchen amLODipine (NORVASC) 10 MG tablet Take 10 mg by mouth daily.        . Choline Fenofibrate (TRILIPIX) 135 MG capsule Take 135 mg by mouth daily.      Tery Sanfilippo Calcium (STOOL SOFTENER PO) Take by mouth as needed.      . dorzolamide-timolol (COSOPT) 22.3-6.8 MG/ML ophthalmic solution Apply 1 drop to eye 2 (two) times daily.        . furosemide (LASIX) 20 MG tablet Take 20 mg by mouth daily.        . insulin glargine (LANTUS) 100 UNIT/ML injection Inject into the skin at bedtime.        . insulin lispro (HUMALOG) 100 UNIT/ML injection Inject into the skin as directed.        . Multiple Vitamin (MULTIVITAMIN) tablet Take 1 tablet by mouth daily.        . pantoprazole (PROTONIX) 40 MG tablet Take 40 mg by  mouth daily.        . pilocarpine (PILOCAR) 0.5 % ophthalmic solution Apply 1-2 drops to eye 4 (four) times daily.        Bertram Gala Glycol-Propyl Glycol (SYSTANE OP) Apply to eye as needed.        . travoprost, benzalkonium, (TRAVATAN) 0.004 % ophthalmic solution Apply 1 drop to eye at bedtime.          Allergies  Allergen Reactions  . Aspirin   . Atorvastatin   . Codeine   . Nitrofurantoin     REACTION: Trouble breathing, and swallowing.  Marland Kitchen Penicillins   . Rosuvastatin   . Sulfonamide Derivatives     No family history on file.  History   Social History  . Marital Status: Widowed    Spouse Name: N/A    Number of Children: N/A  . Years of Education: N/A   Occupational History  . Not on file.   Social History Main Topics  . Smoking status: Never Smoker   . Smokeless tobacco: Not on file  . Alcohol Use: No  . Drug Use: No  . Sexually Active:    Other Topics Concern  .  Not on file   Social History Narrative  . No narrative on file    ROS ALL NEGATIVE EXCEPT THOSE NOTED IN HPI  PE  General Appearance: well developed, well nourished in no acute distress, obese, sitting in a wheelchair HEENT: symmetrical face, PERRLA, good dentition  Neck: no JVD, thyromegaly, or adenopathy, trachea midline Chest: symmetric without deformity Cardiac: PMI non-displaced, RRR, normal S1, S2, no gallop or murmur Lung: clear to ausculation and percussion Vascular: all pulses full without bruits  Abdominal: nondistended, nontender, good bowel sounds, no HSM, no bruits Extremities: Bilateral amputations Skin: normal color, no rashes Neuro: alert and oriented x 3, non-focal Pysch: normal affect  EKG  BMET    Component Value Date/Time   NA 138 06/25/2011 0620   K 4.2 06/25/2011 0620   CL 99 06/25/2011 0620   CO2 27 06/25/2011 0620   GLUCOSE 149* 06/25/2011 0620   BUN 21 06/25/2011 0620   CREATININE 1.29* 06/25/2011 0620   CALCIUM 9.4 06/25/2011 0620   GFRNONAA 39* 06/25/2011 0620     GFRAA 48* 06/25/2011 0620    Lipid Panel  No results found for this basename: chol, trig, hdl, cholhdl, vldl, ldlcalc    CBC    Component Value Date/Time   WBC 8.4 06/25/2011 0620   RBC 3.63* 06/25/2011 0620   HGB 10.6* 06/25/2011 0620   HCT 31.0* 06/25/2011 0620   PLT 306 06/25/2011 0620   MCV 85.4 06/25/2011 0620   MCH 29.2 06/25/2011 0620   MCHC 34.2 06/25/2011 0620   RDW 12.4 06/25/2011 0620   LYMPHSABS 1.9 06/25/2011 0620   MONOABS 0.6 06/25/2011 0620   EOSABS 0.3 06/25/2011 0620   BASOSABS 0.0 06/25/2011 4098

## 2012-01-28 NOTE — Assessment & Plan Note (Signed)
Stable clinically. No change in medications strongly encouraged her to continue her fenofibrate as well as her aspirin. She is very noncompliant and takes her medicines when she wants to.

## 2012-01-28 NOTE — Assessment & Plan Note (Signed)
Stable. Encouraged to take her aspirin and fenofibrate.

## 2012-07-27 ENCOUNTER — Other Ambulatory Visit: Payer: Self-pay | Admitting: Ophthalmology

## 2012-07-28 ENCOUNTER — Encounter (HOSPITAL_COMMUNITY)
Admission: RE | Admit: 2012-07-28 | Discharge: 2012-07-28 | Disposition: A | Discharge status: Home / Self Care | Payer: Medicare Other | Source: Ambulatory Visit | Attending: Ophthalmology | Admitting: Ophthalmology

## 2012-07-28 ENCOUNTER — Encounter (HOSPITAL_COMMUNITY): Payer: Self-pay

## 2012-07-28 ENCOUNTER — Encounter (HOSPITAL_COMMUNITY): Payer: Self-pay | Admitting: Respiratory Therapy

## 2012-07-28 HISTORY — DX: Personal history of other medical treatment: Z92.89

## 2012-07-28 HISTORY — DX: Other complications of anesthesia, initial encounter: T88.59XA

## 2012-07-28 HISTORY — DX: Sleep apnea, unspecified: G47.30

## 2012-07-28 HISTORY — DX: Other specified postprocedural states: Z98.890

## 2012-07-28 HISTORY — DX: Adverse effect of unspecified anesthetic, initial encounter: T41.45XA

## 2012-07-28 HISTORY — DX: Peripheral vascular disease, unspecified: I73.9

## 2012-07-28 HISTORY — DX: Other specified postprocedural states: R11.2

## 2012-07-28 HISTORY — DX: Transient cerebral ischemic attack, unspecified: G45.9

## 2012-07-28 LAB — BASIC METABOLIC PANEL
BUN: 14 mg/dL (ref 6–23)
Calcium: 9.5 mg/dL (ref 8.4–10.5)
Creatinine, Ser: 1.16 mg/dL — ABNORMAL HIGH (ref 0.50–1.10)
GFR calc Af Amer: 49 mL/min — ABNORMAL LOW (ref >90)
GFR calc non Af Amer: 42 mL/min — ABNORMAL LOW (ref >90)

## 2012-07-28 LAB — CBC
MCHC: 35 g/dL (ref 30.0–36.0)
Platelets: 188 10*3/uL (ref 150–400)
RDW: 12.5 % (ref 11.5–15.5)
WBC: 7.2 10*3/uL (ref 4.0–10.5)

## 2012-07-28 LAB — SURGICAL PCR SCREEN
MRSA, PCR: POSITIVE — AB
Staphylococcus aureus: POSITIVE — AB

## 2012-07-28 NOTE — Progress Notes (Signed)
Revonda Standard, [place] notified of history and request for anesthesia consult

## 2012-07-28 NOTE — Pre-Procedure Instructions (Signed)
[name]  07/28/2012   Your procedure is scheduled on:  August 16  Report to [facility] at 10:45 AM.  Call this number if you have problems the morning of surgery: [phone]   Remember:   Do not eat or drink:After Midnight.  Take these medicines the morning of surgery with A SIP OF WATER: Amlodipine, Protonix, Eye drops   Do not wear jewelry, make-up or nail polish.  Do not wear lotions, powders, or perfumes. You may wear deodorant.  Do not shave 48 hours prior to surgery. Men may shave face and neck.  Do not bring valuables to the hospital.  Contacts, dentures or bridgework may not be worn into surgery.  Leave suitcase in the car. After surgery it may be brought to your room.  For patients admitted to the hospital, checkout time is 11:00 AM the day of discharge.   Patients discharged the day of surgery will not be allowed to drive home.  Name and phone number of your driver: Beaulah Corin [phone]  Special Instructions: CHG Shower Use Special Wash: 1/2 bottle night before surgery and 1/2 bottle morning of surgery.   Please read over the following fact sheets that you were given: Pain Booklet, Coughing and Deep Breathing and Surgical Site Infection Prevention

## 2012-07-28 NOTE — Consult Note (Signed)
Anesthesia consult: Patient is an 78 year old black female scheduled for right eye vitrectomy, panretinal laser for glaucoma and vitreous hemorrhage by Dr. Luciana Axe on 07/29/2012. Her PAT appointment was 07/28/2012. She was just seen by Dr. Luciana Axe yesterday for progressive right eye pain, and visual loss. She is already blinded in her left side from a detached retina x 2 with "scar tissue."  She is an extensive medical history including coronary artery disease with myocardial infarction in the 1980s without previous intervention, congestive heart failure, diabetes mellitus type II, hypertension, hyperlipidemia, GERD, arthritis, anxiety, depression, psoriasis, obstructive sleep apnea without CPAP use, breast cancer, status post mastectomy, obesity, PAD s/p LE bypass and eventual bilateral AKAs (last on 06/19/11).  (She was noted to have a right paratracheal soft tissue enlargement on a chest CT from 04/24/10 and CXR from 06/09/11.  Patient reports this was biopsied and showed "inflammation".  I can't find any specific Op note or pathology report to confirm this, however.  She has a chronic intermittent dry cough, but no hemoptysis or significant SOB.)  PCP is Dr. Megan Mans is [place].      Her Anesthesia history includes post-operative nausea and vomiting, post-operative confusion, and post-operative respiratory failure requiring re-intubation following her first AKA on 12/13/09 (see copy of discharge summary).  Of note, due to her history a spinal was attempted for her right AKA on 06/19/11, but it was unsuccessful.  She ultimately had the procedure under [place], and said she did well following that procedure (Anesthesia notes on chart for review).    Her cardiologist is Dr. Valera Castle. She saw her around February of this year. Overall, he felt she was stable from a cardiac standpoint. He did mention that she was sometimes noncompliant and has stopped taking aspirin Fenfibrate. She's not been on beta blocker therapy for  several years now due to bradycardia and AV block. She was seen by EP cardiologist, Dr. Johney Frame in late 2011 for pacemaker evaluation, but by notes, he did not feel she needed one at that time.  Echo on 12/11/09 showed: - Left ventricle: The cavity size was normal. There was moderate concentric hypertrophy. Systolic function was normal. The estimated ejection fraction was in the range of 55% to 60%. - Left atrium: The atrium was moderately dilated. - Pulmonary arteries: PA peak pressure: 43mm Hg (S).  Her last stress test was on 04/28/04 and showed: 1. No evidence of ischemia or infarct.  2. Ejection fraction 57%.   EKG on 07/28/12 showed SR with first degree AVB, LAD, anterior infarct (age undetermined), non-specific lateral ST abnormality.  She has had numerous EKGs in the past (see Muse).  Overall, her EKG was felt stable since last year.  She presents with her daughter.  Patient is in a wheelchair.  She is obese.  She is alert and appropriate.  No conversational dyspnea.  She denies chest pain.  She has occasional SOB but this is chronic.  She gets occasional palpitations which are also chronic.  She feels at her baseline.  Her heart has a RRR, no significant murmur noted.  No carotid bruits.  Lungs were diminished at bases (R > L), but otherwise clear.  VSS.    CXR on 07/28/12: Stable low lung volumes and mild bibasilar atelectasis. No acute findings.  BMET and CBC noted.  History and EKG reviewed with Anesthesiologist Dr. Chaney Malling.  Patient feels at her baseline and is without chest pain or signs of acute CHF.  She will be evaluated by her assigned  Anesthesiologist on the day of surgery, but if no significant change in her status then anticipate she can proceed as planned.  Shonna Chock, PA-C

## 2012-07-29 ENCOUNTER — Ambulatory Visit (HOSPITAL_COMMUNITY)
Admission: RE | Admit: 2012-07-29 | Discharge: 2012-07-30 | Disposition: A | Discharge status: Home / Self Care | Payer: Medicare Other | Source: Ambulatory Visit | Attending: Internal Medicine | Admitting: Internal Medicine

## 2012-07-29 ENCOUNTER — Encounter (HOSPITAL_COMMUNITY)
Admission: RE | Disposition: A | Discharge status: Home / Self Care | Payer: Self-pay | Source: Ambulatory Visit | Attending: Internal Medicine

## 2012-07-29 ENCOUNTER — Ambulatory Visit (HOSPITAL_COMMUNITY): Payer: Medicare Other | Admitting: Vascular Surgery

## 2012-07-29 ENCOUNTER — Encounter (HOSPITAL_COMMUNITY): Payer: Self-pay | Admitting: Vascular Surgery

## 2012-07-29 ENCOUNTER — Encounter (HOSPITAL_COMMUNITY): Payer: Self-pay | Admitting: *Deleted

## 2012-07-29 DIAGNOSIS — H4089 Other specified glaucoma: Secondary | ICD-10-CM | POA: Insufficient documentation

## 2012-07-29 DIAGNOSIS — H4311 Vitreous hemorrhage, right eye: Secondary | ICD-10-CM

## 2012-07-29 DIAGNOSIS — H409 Unspecified glaucoma: Secondary | ICD-10-CM

## 2012-07-29 DIAGNOSIS — Z01812 Encounter for preprocedural laboratory examination: Secondary | ICD-10-CM | POA: Insufficient documentation

## 2012-07-29 DIAGNOSIS — H4310 Vitreous hemorrhage, unspecified eye: Secondary | ICD-10-CM | POA: Insufficient documentation

## 2012-07-29 DIAGNOSIS — R4182 Altered mental status, unspecified: Secondary | ICD-10-CM

## 2012-07-29 DIAGNOSIS — Z01810 Encounter for preprocedural cardiovascular examination: Secondary | ICD-10-CM | POA: Insufficient documentation

## 2012-07-29 DIAGNOSIS — E1165 Type 2 diabetes mellitus with hyperglycemia: Secondary | ICD-10-CM

## 2012-07-29 DIAGNOSIS — S88911A Complete traumatic amputation of right lower leg, level unspecified, initial encounter: Secondary | ICD-10-CM | POA: Diagnosis present

## 2012-07-29 DIAGNOSIS — H16009 Unspecified corneal ulcer, unspecified eye: Secondary | ICD-10-CM | POA: Insufficient documentation

## 2012-07-29 DIAGNOSIS — Z01818 Encounter for other preprocedural examination: Secondary | ICD-10-CM | POA: Insufficient documentation

## 2012-07-29 DIAGNOSIS — I739 Peripheral vascular disease, unspecified: Secondary | ICD-10-CM | POA: Insufficient documentation

## 2012-07-29 DIAGNOSIS — I2510 Atherosclerotic heart disease of native coronary artery without angina pectoris: Secondary | ICD-10-CM | POA: Diagnosis present

## 2012-07-29 DIAGNOSIS — IMO0002 Reserved for concepts with insufficient information to code with codable children: Secondary | ICD-10-CM | POA: Diagnosis present

## 2012-07-29 HISTORY — DX: Localized swelling, mass and lump, trunk: R22.2

## 2012-07-29 LAB — GLUCOSE, CAPILLARY: Glucose-Capillary: 135 mg/dL — ABNORMAL HIGH (ref 70–99)

## 2012-07-29 SURGERY — VITRECTOMY 25 GAUGE WITH BAERVELDT
Anesthesia: General | Laterality: Right

## 2012-07-29 SURGERY — VITRECTOMY 25 GAUGE WITH BAERVELDT
Anesthesia: General | Site: Eye | Laterality: Right | Wound class: Clean

## 2012-07-29 MED ORDER — AMLODIPINE BESYLATE 10 MG PO TABS
10.0000 mg | ORAL_TABLET | Freq: Every day | ORAL | Status: DC
  Administered 2012-07-30: 10 mg via ORAL
  Filled 2012-07-29 (×3): qty 1

## 2012-07-29 MED ORDER — ATROPINE SULFATE 1 % OP SOLN
1.0000 [drp] | OPHTHALMIC | Status: AC | PRN
  Administered 2012-07-29 (×3): 1 [drp] via OPHTHALMIC

## 2012-07-29 MED ORDER — INSULIN GLARGINE 100 UNIT/ML ~~LOC~~ SOLN
50.0000 [IU] | Freq: Every morning | SUBCUTANEOUS | Status: DC
  Administered 2012-07-30: 50 [IU] via SUBCUTANEOUS

## 2012-07-29 MED ORDER — SODIUM CHLORIDE 0.9 % IV SOLN: INTRAVENOUS | Status: AC

## 2012-07-29 MED ORDER — DOCUSATE SODIUM 100 MG PO CAPS: 100.0000 mg | ORAL_CAPSULE | Freq: Every day | ORAL | Status: DC | PRN

## 2012-07-29 MED ORDER — SODIUM HYALURONATE 10 MG/ML IO SOLN
INTRAOCULAR | Status: AC
  Filled 2012-07-29: qty 0.85

## 2012-07-29 MED ORDER — SODIUM CHLORIDE 0.9 % IV SOLN
INTRAVENOUS | Status: DC | PRN
  Administered 2012-07-29: 13:00:00 via INTRAVENOUS

## 2012-07-29 MED ORDER — LIDOCAINE HCL (CARDIAC) 20 MG/ML IV SOLN
INTRAVENOUS | Status: DC | PRN
  Administered 2012-07-29: 40 mg via INTRAVENOUS

## 2012-07-29 MED ORDER — PANTOPRAZOLE SODIUM 40 MG PO TBEC
40.0000 mg | DELAYED_RELEASE_TABLET | Freq: Every day | ORAL | Status: DC
  Administered 2012-07-30: 40 mg via ORAL
  Filled 2012-07-29: qty 1

## 2012-07-29 MED ORDER — HYDROMORPHONE HCL PF 1 MG/ML IJ SOLN
INTRAMUSCULAR | Status: AC
  Filled 2012-07-29: qty 1

## 2012-07-29 MED ORDER — ALLOPURINOL 100 MG PO TABS
100.0000 mg | ORAL_TABLET | Freq: Every day | ORAL | Status: DC
  Administered 2012-07-29 – 2012-07-30 (×2): 100 mg via ORAL
  Filled 2012-07-29 (×3): qty 1

## 2012-07-29 MED ORDER — INSULIN ASPART 100 UNIT/ML ~~LOC~~ SOLN
0.0000 [IU] | Freq: Three times a day (TID) | SUBCUTANEOUS | Status: DC
  Administered 2012-07-30: 3 [IU] via SUBCUTANEOUS
  Administered 2012-07-30: 5 [IU] via SUBCUTANEOUS

## 2012-07-29 MED ORDER — NEOSTIGMINE METHYLSULFATE 1 MG/ML IJ SOLN
INTRAMUSCULAR | Status: DC | PRN
  Administered 2012-07-29: 3 mg via INTRAVENOUS

## 2012-07-29 MED ORDER — EPINEPHRINE HCL 1 MG/ML IJ SOLN
INTRAOCULAR | Status: DC | PRN
  Administered 2012-07-29: 12:00:00

## 2012-07-29 MED ORDER — DEXAMETHASONE SODIUM PHOSPHATE 10 MG/ML IJ SOLN
INTRAMUSCULAR | Status: AC
  Filled 2012-07-29: qty 1

## 2012-07-29 MED ORDER — HYPROMELLOSE (GONIOSCOPIC) 2.5 % OP SOLN
OPHTHALMIC | Status: DC | PRN
  Administered 2012-07-29: 2 [drp] via OPHTHALMIC

## 2012-07-29 MED ORDER — GLYCOPYRROLATE 0.2 MG/ML IJ SOLN
INTRAMUSCULAR | Status: DC | PRN
  Administered 2012-07-29: 0.4 mg via INTRAVENOUS

## 2012-07-29 MED ORDER — KETOROLAC TROMETHAMINE 0.5 % OP SOLN: 1.0000 [drp] | OPHTHALMIC | Status: DC

## 2012-07-29 MED ORDER — CYCLOPENTOLATE HCL 1 % OP SOLN
1.0000 [drp] | OPHTHALMIC | Status: AC | PRN
  Administered 2012-07-29 (×3): 1 [drp] via OPHTHALMIC

## 2012-07-29 MED ORDER — BSS IO SOLN
INTRAOCULAR | Status: AC
  Filled 2012-07-29: qty 500

## 2012-07-29 MED ORDER — INSULIN ASPART 100 UNIT/ML ~~LOC~~ SOLN: 0.0000 [IU] | Freq: Three times a day (TID) | SUBCUTANEOUS | Status: DC

## 2012-07-29 MED ORDER — SODIUM CHLORIDE 0.9 % IV SOLN
INTRAVENOUS | Status: DC
  Administered 2012-07-29 – 2012-07-30 (×2): via INTRAVENOUS

## 2012-07-29 MED ORDER — PROPOFOL 10 MG/ML IV EMUL
INTRAVENOUS | Status: DC | PRN
  Administered 2012-07-29: 150 mg via INTRAVENOUS

## 2012-07-29 MED ORDER — NA CHONDROIT SULF-NA HYALURON 40-30 MG/ML IO SOLN
INTRAOCULAR | Status: DC | PRN
  Administered 2012-07-29: 0.5 mL via INTRAOCULAR

## 2012-07-29 MED ORDER — KETOROLAC TROMETHAMINE 0.5 % OP SOLN
OPHTHALMIC | Status: AC
  Filled 2012-07-29: qty 5

## 2012-07-29 MED ORDER — CYCLOPENTOLATE HCL 1 % OP SOLN
OPHTHALMIC | Status: AC
  Administered 2012-07-29: 1 [drp] via OPHTHALMIC
  Filled 2012-07-29: qty 2

## 2012-07-29 MED ORDER — CLINDAMYCIN PHOSPHATE 600 MG/50ML IV SOLN
INTRAVENOUS | Status: DC | PRN
  Administered 2012-07-29: 600 mg via INTRAVENOUS

## 2012-07-29 MED ORDER — DEXAMETHASONE SODIUM PHOSPHATE 10 MG/ML IJ SOLN
INTRAMUSCULAR | Status: DC | PRN
  Administered 2012-07-29: 10 mg

## 2012-07-29 MED ORDER — ACETAMINOPHEN 650 MG RE SUPP: 650.0000 mg | Freq: Four times a day (QID) | RECTAL | Status: DC | PRN

## 2012-07-29 MED ORDER — HYDROMORPHONE HCL PF 1 MG/ML IJ SOLN
0.2500 mg | INTRAMUSCULAR | Status: DC | PRN
  Administered 2012-07-29 (×2): 0.5 mg via INTRAVENOUS

## 2012-07-29 MED ORDER — HYPROMELLOSE (GONIOSCOPIC) 2.5 % OP SOLN
OPHTHALMIC | Status: AC
  Filled 2012-07-29: qty 15

## 2012-07-29 MED ORDER — EPINEPHRINE HCL 1 MG/ML IJ SOLN
INTRAMUSCULAR | Status: AC
  Filled 2012-07-29: qty 1

## 2012-07-29 MED ORDER — POLYMYXIN B SULFATE 500000 UNITS IJ SOLR
INTRAMUSCULAR | Status: AC
  Filled 2012-07-29: qty 1

## 2012-07-29 MED ORDER — LIDOCAINE HCL 2 % IJ SOLN
INTRAMUSCULAR | Status: AC
  Filled 2012-07-29: qty 1

## 2012-07-29 MED ORDER — TROPICAMIDE 1 % OP SOLN
OPHTHALMIC | Status: AC
  Administered 2012-07-29: 1 [drp] via OPHTHALMIC
  Filled 2012-07-29: qty 3

## 2012-07-29 MED ORDER — CLINDAMYCIN PHOSPHATE 600 MG/50ML IV SOLN
INTRAVENOUS | Status: AC
  Filled 2012-07-29: qty 50

## 2012-07-29 MED ORDER — BSS PLUS IO SOLN
INTRAOCULAR | Status: AC
  Filled 2012-07-29: qty 500

## 2012-07-29 MED ORDER — SODIUM HYALURONATE 10 MG/ML IO SOLN
INTRAOCULAR | Status: DC | PRN
  Administered 2012-07-29: 1 mL via INTRAOCULAR

## 2012-07-29 MED ORDER — BSS IO SOLN
INTRAOCULAR | Status: AC
  Filled 2012-07-29: qty 15

## 2012-07-29 MED ORDER — ONDANSETRON HCL 4 MG PO TABS: 4.0000 mg | ORAL_TABLET | Freq: Four times a day (QID) | ORAL | Status: DC | PRN

## 2012-07-29 MED ORDER — ACETAMINOPHEN 325 MG PO TABS
650.0000 mg | ORAL_TABLET | Freq: Four times a day (QID) | ORAL | Status: DC | PRN
  Administered 2012-07-29: 650 mg via ORAL
  Filled 2012-07-29: qty 2

## 2012-07-29 MED ORDER — ATROPINE SULFATE 1 % OP SOLN
OPHTHALMIC | Status: AC
  Administered 2012-07-29: 1 [drp] via OPHTHALMIC
  Filled 2012-07-29: qty 2

## 2012-07-29 MED ORDER — TROPICAMIDE 1 % OP SOLN
1.0000 [drp] | OPHTHALMIC | Status: AC | PRN
  Administered 2012-07-29 (×3): 1 [drp] via OPHTHALMIC

## 2012-07-29 MED ORDER — ONDANSETRON HCL 4 MG/2ML IJ SOLN: 4.0000 mg | Freq: Four times a day (QID) | INTRAMUSCULAR | Status: DC | PRN

## 2012-07-29 MED ORDER — GATIFLOXACIN 0.5 % OP SOLN
1.0000 [drp] | OPHTHALMIC | Status: AC | PRN
  Administered 2012-07-29 (×3): 1 [drp] via OPHTHALMIC

## 2012-07-29 MED ORDER — NA CHONDROIT SULF-NA HYALURON 40-30 MG/ML IO SOLN
INTRAOCULAR | Status: AC
Start: 2012-07-29 — End: 2012-07-29
  Filled 2012-07-29: qty 0.5

## 2012-07-29 MED ORDER — 0.9 % SODIUM CHLORIDE (POUR BTL) OPTIME
TOPICAL | Status: DC | PRN
  Administered 2012-07-29: 1000 mL

## 2012-07-29 MED ORDER — ONDANSETRON HCL 4 MG/2ML IJ SOLN
INTRAMUSCULAR | Status: DC | PRN
  Administered 2012-07-29: 4 mg via INTRAVENOUS

## 2012-07-29 MED ORDER — TETRACAINE HCL 0.5 % OP SOLN
OPHTHALMIC | Status: AC
  Filled 2012-07-29: qty 2

## 2012-07-29 MED ORDER — GENTAMICIN SULFATE 40 MG/ML IJ SOLN
INTRAMUSCULAR | Status: AC
  Filled 2012-07-29: qty 2

## 2012-07-29 MED ORDER — SUFENTANIL CITRATE 50 MCG/ML IV SOLN
INTRAVENOUS | Status: DC | PRN
  Administered 2012-07-29: 20 ug via INTRAVENOUS

## 2012-07-29 MED ORDER — PROPOFOL 10 MG/ML IV EMUL: INTRAVENOUS | Status: DC | PRN

## 2012-07-29 MED ORDER — DROPERIDOL 2.5 MG/ML IJ SOLN: 0.6250 mg | INTRAMUSCULAR | Status: DC | PRN

## 2012-07-29 MED ORDER — ACETAMINOPHEN 325 MG PO TABS
ORAL_TABLET | ORAL | Status: AC
  Administered 2012-07-29: 325 mg
  Filled 2012-07-29: qty 2

## 2012-07-29 MED ORDER — GATIFLOXACIN 0.5 % OP SOLN
OPHTHALMIC | Status: AC
  Administered 2012-07-29: 1 [drp] via OPHTHALMIC
  Filled 2012-07-29: qty 2.5

## 2012-07-29 MED ORDER — ROCURONIUM BROMIDE 100 MG/10ML IV SOLN
INTRAVENOUS | Status: DC | PRN
  Administered 2012-07-29: 40 mg via INTRAVENOUS

## 2012-07-29 SURGICAL SUPPLY — 77 items
ACCESSORY FRAGMATOME (MISCELLANEOUS) IMPLANT
ALLOGRAFT TUTOPLAST 1.5X1.5 (Ophthalmic Related) ×1 IMPLANT
APL SRG 3 HI ABS STRL LF PLS (MISCELLANEOUS)
APPLICATOR COTTON TIP 6IN STRL (MISCELLANEOUS) ×3 IMPLANT
APPLICATOR DR MATTHEWS STRL (MISCELLANEOUS) IMPLANT
BLADE MVR KNIFE 20G (BLADE) ×2 IMPLANT
BLADE SURG 11 STRL SS (BLADE) ×2 IMPLANT
CANNULA ANT CHAM MAIN (OPHTHALMIC RELATED) IMPLANT
CANNULA FLEX TIP 25G (CANNULA) IMPLANT
CLOTH BEACON ORANGE TIMEOUT ST (SAFETY) ×3 IMPLANT
CORDS BIPOLAR (ELECTRODE) IMPLANT
DRAPE INCISE 51X51 W/FILM STRL (DRAPES) ×2 IMPLANT
DRAPE OPHTHALMIC 77X100 STRL (CUSTOM PROCEDURE TRAY) ×3 IMPLANT
FILTER BLUE MILLIPORE (MISCELLANEOUS) IMPLANT
FORCEPS ECKARDT ILM 25G SERR (OPHTHALMIC RELATED) IMPLANT
FORCEPS HORIZONTAL 25G DISP (OPHTHALMIC RELATED) IMPLANT
GAS OPHTHALMIC (MISCELLANEOUS) IMPLANT
GLOVE BIO SURGEON STRL SZ8 (GLOVE) ×2 IMPLANT
GLOVE ECLIPSE 6.5 STRL STRAW (GLOVE) ×4 IMPLANT
GLOVE SS BIOGEL STRL SZ 8.5 (GLOVE) ×2 IMPLANT
GLOVE SUPERSENSE BIOGEL SZ 8.5 (GLOVE) ×1
GLOVE SURG SS PI 7.0 STRL IVOR (GLOVE) ×2 IMPLANT
GOWN STRL NON-REIN LRG LVL3 (GOWN DISPOSABLE) ×5 IMPLANT
ILLUMINATOR ENDO 25GA (MISCELLANEOUS) ×3 IMPLANT
IMPL OPTH 350SQ QDRNT INS (Ophthalmic Related) ×1 IMPLANT
IMPLANT GLAUCOMA (Ophthalmic Related) ×3 IMPLANT
KIT BASIN OR (CUSTOM PROCEDURE TRAY) ×3 IMPLANT
KIT PERFLUORON PROCEDURE 5ML (MISCELLANEOUS) IMPLANT
KIT ROOM TURNOVER OR (KITS) ×3 IMPLANT
KNIFE CRESCENT 2.5 55 ANG (BLADE) ×2 IMPLANT
LENS BIOM SUPER VIEW SET DISP (OPHTHALMIC RELATED) ×3 IMPLANT
MARKER SKIN DUAL TIP RULER LAB (MISCELLANEOUS) IMPLANT
MASK EYE SHIELD (GAUZE/BANDAGES/DRESSINGS) ×2 IMPLANT
MICROPICK 25G (MISCELLANEOUS)
NDL 18GX1X1/2 (RX/OR ONLY) (NEEDLE) IMPLANT
NDL 25GX 5/8IN NON SAFETY (NEEDLE) IMPLANT
NDL FILTER BLUNT 18X1 1/2 (NEEDLE) IMPLANT
NDL HYPO 25GX1X1/2 BEV (NEEDLE) IMPLANT
NEEDLE 18GX1X1/2 (RX/OR ONLY) (NEEDLE) IMPLANT
NEEDLE 25GX 5/8IN NON SAFETY (NEEDLE) IMPLANT
NEEDLE FILTER BLUNT 18X 1/2SAF (NEEDLE) ×1
NEEDLE FILTER BLUNT 18X1 1/2 (NEEDLE) ×2 IMPLANT
NEEDLE HYPO 25GX1X1/2 BEV (NEEDLE) ×3 IMPLANT
NS IRRIG 1000ML POUR BTL (IV SOLUTION) ×3 IMPLANT
PACK VITRECTOMY CUSTOM (CUSTOM PROCEDURE TRAY) ×3 IMPLANT
PAD ARMBOARD 7.5X6 YLW CONV (MISCELLANEOUS) ×6 IMPLANT
PAD EYE OVAL STERILE LF (GAUZE/BANDAGES/DRESSINGS) ×2 IMPLANT
PAK VITRECTOMY PIK 25 GA (OPHTHALMIC RELATED) ×3 IMPLANT
PENCIL BIPOLAR 25GA STR DISP (OPHTHALMIC RELATED) IMPLANT
PICK MICROPICK 25G (MISCELLANEOUS) IMPLANT
PROBE DIRECTIONAL LASER (MISCELLANEOUS) ×2 IMPLANT
RETRACTOR IRIS FLEX 25G GRIESH (INSTRUMENTS) ×2 IMPLANT
ROLLS DENTAL (MISCELLANEOUS) IMPLANT
SCRAPER DIAMOND 25GA (OPHTHALMIC RELATED) IMPLANT
SET BSS IRRIGATION ADMIN (SET/KITS/TRAYS/PACK) ×2 IMPLANT
SET FLUID INJECTOR (SET/KITS/TRAYS/PACK) IMPLANT
SPONGE GAUZE 4X4 12PLY (GAUZE/BANDAGES/DRESSINGS) ×2 IMPLANT
STOCKINETTE IMPERVIOUS 9X36 MD (GAUZE/BANDAGES/DRESSINGS) ×6 IMPLANT
STOPCOCK 4 WAY LG BORE MALE ST (IV SETS) IMPLANT
SUT ETHILON 10 0 CS140 6 (SUTURE) IMPLANT
SUT ETHILON 4 0 CL P 3 (SUTURE) ×2 IMPLANT
SUT ETHILON 8 0 BV130 4 (SUTURE) ×2 IMPLANT
SUT ETHILON 8 0 TG100 8 (SUTURE) ×3 IMPLANT
SUT MERSILENE 5 0 RD 1 DA (SUTURE) IMPLANT
SUT PROLENE 10 0 CIF 4 DA (SUTURE) IMPLANT
SUT SILK 2 0 (SUTURE) ×3
SUT SILK 2-0 18XBRD TIE 12 (SUTURE) ×2 IMPLANT
SUT VICRYL 7 0 TG140 8 (SUTURE) ×2 IMPLANT
SYR 30ML SLIP (SYRINGE) IMPLANT
SYR 5ML LL (SYRINGE) IMPLANT
SYR TB 1ML LUER SLIP (SYRINGE) ×2 IMPLANT
TAPE SURG TRANSPORE 1 IN (GAUZE/BANDAGES/DRESSINGS) ×1 IMPLANT
TAPE SURGICAL TRANSPORE 1 IN (GAUZE/BANDAGES/DRESSINGS) ×1
TOWEL OR 17X24 6PK STRL BLUE (TOWEL DISPOSABLE) ×6 IMPLANT
TUTOPLAST 1.5X1.5 (Ophthalmic Related) ×3 IMPLANT
WATER STERILE IRR 1000ML POUR (IV SOLUTION) ×3 IMPLANT
WIPE INSTRUMENT VISIWIPE 73X73 (MISCELLANEOUS) ×3 IMPLANT

## 2012-07-29 NOTE — H&P (Addendum)
[name] is an 78 y.o. female.   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 Diabetes mellitus   . Hypertension   . Hyperlipidemia   . GERD (gastroesophageal reflux disease)   . Arthritis   . Anxiety   . Depression   . Psoriasis   . CHF (congestive heart failure)   . CAD (coronary artery disease)   . Cancer     Left Breast  . History of detached retina repair     Bilateral  . Complication of anesthesia     History of cardiac arrest after being in admitted to room, pain medication does not agree with patient  . PONV (postoperative nausea and vomiting)   . Myocardial infarction 1980's  . TIA (transient ischemic attack)   . History of blood transfusion     d/t surgery  . Sleep apnea     Does not use CPAP  . Peripheral vascular disease   . Nodule of chest wall 2010    benign mass chest    Past Surgical History  Procedure Date  . Femoral-peroneal bpg  2006    Right leg  . Above knee leg amputation 12/12/09    Left leg  . Abdominal hysterectomy   . Appendectomy   . Eye surgery     Bilateral cataract  . Tonsillectomy and adenoidectomy   . Umbilical hernia repair   . Finger amputation 1997    Left Third digit- distal end  . Breast surgery 1995    Left Breast mastectomy  . Above knee leg amputation 06/29/11    Right AKA by Dr. Arbie Cookey    History reviewed. No pertinent family history. Social History:  reports that she has never smoked. She does not have any smokeless tobacco history on file. She reports that she does not drink alcohol or use illicit drugs.  Allergies:  Allergies  Allergen Reactions  . Aspirin   . Atorvastatin   . Codeine     Hallucinations/Passes Out   . Demerol (Meperidine)     Hallucinations/Passes Out   . Fentanyl     Hallucinations/Passes  Out   . Morphine And Related     Hallucinations/Passes Out   . Nitrofurantoin     REACTION: Trouble breathing, and swallowing.  Marland Kitchen Penicillins   . Rosuvastatin   . Sulfonamide Derivatives     Medications Prior to Admission  Medication Sig Dispense Refill  . allopurinol (ZYLOPRIM) 100 MG tablet Take 100 mg by mouth daily.        Marland Kitchen amLODipine (NORVASC) 10 MG tablet Take 10 mg by mouth daily.        Tery Sanfilippo Calcium (STOOL SOFTENER PO) Take by mouth as needed.      . dorzolamide-timolol (COSOPT) 22.3-6.8 MG/ML ophthalmic solution Place 1 drop into both eyes 2 (two) times daily.       . furosemide (LASIX) 20 MG tablet Take 20 mg by mouth daily.        . insulin glargine (LANTUS) 100 UNIT/ML injection Inject 60 Units into the skin every morning.       . insulin lispro (HUMALOG) 100 UNIT/ML injection Inject 5-20 Units into the skin 3 (three) times daily before meals. Sliding scale      . Multiple Vitamin (MULTIVITAMIN) tablet Take 1 tablet by mouth daily.        Marland Kitchen  ofloxacin (OCUFLOX) 0.3 % ophthalmic solution Place 1 drop into the right eye 4 (four) times daily.      . pantoprazole (PROTONIX) 40 MG tablet Take 40 mg by mouth daily.        Bertram Gala Glycol-Propyl Glycol (SYSTANE OP) Apply 1 drop to eye as needed. For dry eyes      . prednisoLONE acetate (PRED FORTE) 1 % ophthalmic suspension Place 1 drop into the right eye 4 (four) times daily.      . travoprost, benzalkonium, (TRAVATAN) 0.004 % ophthalmic solution Place 1 drop into both eyes at bedtime.       . moxifloxacin (VIGAMOX) 0.5 % ophthalmic solution Place 1 drop into the right eye 4 (four) times daily.        Results for orders placed during the hospital encounter of 07/29/12 (from the past 48 hour(s))  GLUCOSE, CAPILLARY     Status: Abnormal   Collection Time   07/29/12 11:33 AM      Component Value Range Comment   Glucose-Capillary 146 (*) 70 - 99 mg/dL   GLUCOSE, CAPILLARY     Status: Abnormal   Collection Time   07/29/12  12:52 PM      Component Value Range Comment   Glucose-Capillary 134 (*) 70 - 99 mg/dL    Dg Chest 2 View  1/61/0960  *RADIOLOGY REPORT*  Clinical Data: Hypertension.  Preop respiratory exam.  CHEST - 2 VIEW  Comparison: 06/09/2011  Findings: Low lung volumes are again seen with mild bibasilar atelectasis.  Lung fields otherwise clear.  No evidence of pleural effusion.  Heart size is prominent stable and likely exaggerated by low lung volumes.  IMPRESSION: Stable low lung volumes and mild bibasilar atelectasis.  No acute findings.  Original Report Authenticated By: Danae Orleans, M.D.    Review of Systems  Constitutional: Negative.   HENT: Negative.   Eyes: Positive for blurred vision.  Respiratory: Negative.   Cardiovascular: Negative.   Gastrointestinal: Negative.   Genitourinary: Negative.   Musculoskeletal: Negative.   Skin: Negative.   Neurological: Negative.   Endo/Heme/Allergies: Negative.   Psychiatric/Behavioral: Negative.     Blood pressure 142/80, pulse 65, temperature 98 F (36.7 C), temperature source Oral, resp. rate 18, height 5\' 4"  (1.626 m), weight 104.327 kg (230 lb), SpO2 98.00%. Physical Exam  Constitutional: She is oriented to person, place, and time. Vital signs are normal. She appears well-developed and well-nourished.  HENT:  Head: Normocephalic and atraumatic.  Eyes: Conjunctivae, EOM and lids are normal. No foreign bodies found.  Neck: Trachea normal and normal range of motion.  Cardiovascular: Normal rate, regular rhythm, normal heart sounds and normal pulses.   Respiratory: Effort normal and breath sounds normal.  GI: Soft. Normal appearance and bowel sounds are normal.  Musculoskeletal:       No LOWER EXTREMITIES  Neurological: She is alert and oriented to person, place, and time. She has normal strength and normal reflexes. She displays a negative Romberg sign.  Skin: Skin is warm and dry.  Psychiatric: She has a normal mood and affect. Her speech  is normal and behavior is normal. Judgment and thought content normal. Cognition and memory are normal.     Assessment/Plan PLAN VITRECTOMY  AND ENDOLASER, RIGHT EYE, WITH SUTURE EYELID TARSORRHAPHY RIGHT EYE. And possible glaucoma shunt valve, baerveldt. UNDER GENERAL ANESTHESIA  Avannah Decker A 07/29/2012, 1:10 PM

## 2012-07-29 NOTE — Brief Op Note (Signed)
07/29/2012  3:56 PM  PATIENT:  [name]  78 y.o. female  PRE-OPERATIVE DIAGNOSIS:  Glaucoma with vascular disorder, vitreous hemorrhage right eye, corneal epithelial defect, scar, with neurotrophic corneal scar.  POST-OPERATIVE DIAGNOSIS:  Glaucoma with vascular disorder, vitreous hemorrhage right eye...same  PROCEDURE:  Procedure(s) (LRB): VITRECTOMY 25 GAUGE with panretinal endolaser photocoagulation 2. Insertion of aqueous shunt,  BAERVELDT (Right)eye, pars plana 102-350,  3,scleral patch graft  4.  Lateral tarsorrhapy. Semipermanent, reversible  SURGEON:  Surgeon(s) and Role:    * Edmon Crape, MD - Primary  PHYSICIAN ASSISTANT:   ASSISTANTS: none   ANESTHESIA:   general  EBL:  Total I/O In: 500 [I.V.:500] Out: -   BLOOD ADMINISTERED:none  DRAINS: none   LOCAL MEDICATIONS USED:  NONE  SPECIMEN:  No Specimen  DISPOSITION OF SPECIMEN:  N/A  COUNTS:  YES  TOURNIQUET:  * No tourniquets in log *  DICTATION: .Other Dictation: Dictation Number   [id number]  PLAN OF CARE: Discharge to home after PACU  PATIENT DISPOSITION:  PACU - hemodynamically stable.   Delay start of Pharmacological VTE agent (>24hrs) due to surgical blood loss or risk of bleeding: not applicable, bilateral lower extremity amputee

## 2012-07-29 NOTE — Anesthesia Procedure Notes (Signed)
Procedure Name: Intubation Date/Time: 07/29/2012 1:32 PM Performed by: Patrick North Pre-anesthesia Checklist: Patient identified, Emergency Drugs available, Suction available, Patient being monitored and Timeout performed Patient Re-evaluated:Patient Re-evaluated prior to inductionOxygen Delivery Method: Circle system utilized Preoxygenation: Pre-oxygenation with 100% oxygen Intubation Type: IV induction Ventilation: Mask ventilation without difficulty and Oral airway inserted - appropriate to patient size Laryngoscope Size: Mac and 3 Grade View: Grade I Tube size: 7.0 mm Airway Equipment and Method: Stylet Placement Confirmation: ETT inserted through vocal cords under direct vision,  positive ETCO2 and breath sounds checked- equal and bilateral Secured at: 22 cm Dental Injury: Teeth and Oropharynx as per pre-operative assessment

## 2012-07-29 NOTE — Preoperative (Signed)
Beta Blockers   Reason not to administer Beta Blockers:Not Applicable 

## 2012-07-29 NOTE — Anesthesia Postprocedure Evaluation (Signed)
  Anesthesia Post-op Note  Patient: [name]  Procedure(s) Performed: Procedure(s) (LRB): VITRECTOMY 25 GAUGE WITH BAERVELDT (Right)  Patient Location: PACU  Anesthesia Type: General  Level of Consciousness: awake  Airway and Oxygen Therapy: Patient Spontanous Breathing  Post-op Pain: mild  Post-op Assessment: Post-op Vital signs reviewed  Post-op Vital Signs: stable  Complications: No apparent anesthesia complications

## 2012-07-29 NOTE — H&P (Signed)
PCP:   Alice Reichert, MD   Chief Complaint:  Referred by Dr.Rankin for overnight observation  HPI: Patient is an 78 year old black female just s/p Right eye vitrectomy, panretinal laser photocoagulation for glaucoma and vitreous hemorrhage by Dr. Luciana Axe on 07/29/2012.  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 Due to persistent lethargy from anesthesia and multiple medical problems she was referred for overnight observation from PACU by Dr.RAnkin   Allergies:   Allergies  Allergen Reactions  . Aspirin   . Atorvastatin   . Codeine     Hallucinations/Passes Out   . Demerol (Meperidine)     Hallucinations/Passes Out   . Fentanyl     Hallucinations/Passes Out   . Morphine And Related     Hallucinations/Passes Out   . Nitrofurantoin     REACTION: Trouble breathing, and swallowing.  Marland Kitchen Penicillins   . Rosuvastatin   . Sulfonamide Derivatives       Past Medical History  Diagnosis Date  . Diabetes mellitus   . Hypertension   . Hyperlipidemia   . GERD (gastroesophageal reflux disease)   . Arthritis   . Anxiety   . Depression   . Psoriasis   . CHF (congestive heart failure)   . CAD (coronary artery disease)   . Cancer     Left Breast  . History of detached retina repair     Bilateral  . Complication of anesthesia     History of cardiac arrest after being in admitted to room, pain medication does not agree with patient  . PONV (postoperative nausea and vomiting)   . Myocardial infarction 1980's  . TIA (transient ischemic attack)   . History of blood transfusion     d/t surgery  .  Sleep apnea     Does not use CPAP  . Peripheral vascular disease   . Nodule of chest wall 2010    benign mass chest    Past Surgical History  Procedure Date  . Femoral-peroneal bpg  2006    Right leg  . Above knee leg amputation 12/12/09    Left leg  . Abdominal hysterectomy   . Appendectomy   . Eye surgery     Bilateral cataract  . Tonsillectomy and adenoidectomy   . Umbilical hernia repair   . Finger amputation 1997    Left Third digit- distal end  . Breast surgery 1995    Left Breast mastectomy  . Above knee leg amputation 06/29/11    Right AKA by Dr. Arbie Cookey    Prior to Admission medications   Medication Sig Start Date End Date Taking? Authorizing Provider  allopurinol (ZYLOPRIM) 100 MG tablet Take 100 mg by mouth daily.     Yes Historical Provider, MD  amLODipine (NORVASC) 10 MG tablet Take 10 mg by mouth daily.     Yes Historical Provider, MD  Docusate Calcium (STOOL SOFTENER PO) Take by mouth as needed.   Yes Historical Provider, MD  furosemide (LASIX) 20 MG tablet Take 20 mg by mouth daily.     Yes Historical Provider, MD  insulin glargine (LANTUS) 100 UNIT/ML injection Inject 60 Units into the skin every morning.    Yes Historical Provider, MD  insulin lispro (HUMALOG) 100 UNIT/ML injection Inject 5-20 Units into the skin 3 (three) times daily before meals. Sliding scale   Yes Historical Provider, MD  ofloxacin (OCUFLOX) 0.3 % ophthalmic solution Place 1 drop into the right eye 4 (four) times daily.   Yes Historical Provider, MD  pantoprazole (PROTONIX) 40 MG tablet Take 40 mg by mouth daily.     Yes Historical Provider, MD  Polyethyl Glycol-Propyl Glycol (SYSTANE OP) Apply 1 drop to eye as needed. For dry eyes   Yes Historical Provider, MD  prednisoLONE acetate (PRED FORTE) 1 % ophthalmic suspension Place 1 drop into the right eye 4 (four) times daily.   Yes Historical Provider, MD  moxifloxacin (VIGAMOX) 0.5 % ophthalmic solution Place 1 drop into the right eye 4  (four) times daily.    Historical Provider, MD    Social History:  reports that she has never smoked. She does not have any smokeless tobacco history on file. She reports that she does not drink alcohol or use illicit drugs.  History reviewed. No pertinent family history.  Review of Systems:  Constitutional: Denies fever, chills, diaphoresis, appetite change and fatigue.  HEENT: Denies photophobia, eye pain, redness, hearing loss, ear pain, congestion, sore throat, rhinorrhea, sneezing, mouth sores, trouble swallowing, neck pain, neck stiffness and tinnitus.   Respiratory: Denies SOB, DOE, cough, chest tightness,  and wheezing.   Cardiovascular: Denies chest pain, palpitations and leg swelling.  Gastrointestinal: Denies nausea, vomiting, abdominal pain, diarrhea, constipation, blood in stool and abdominal distention.  Genitourinary: Denies dysuria, urgency, frequency, hematuria, flank pain and difficulty urinating.  Musculoskeletal: Denies myalgias, back pain, joint swelling, arthralgias and gait problem.  Skin: Denies pallor, rash and wound.  Neurological: Denies dizziness, seizures, syncope, weakness, light-headedness, numbness and headaches.  Hematological: Denies adenopathy. Easy bruising, personal or family bleeding history  Psychiatric/Behavioral: Denies suicidal ideation, mood changes, confusion, nervousness, sleep disturbance and agitation   Physical Exam: Blood pressure 130/40, pulse 72, temperature 97.7 F (36.5 C), temperature source Oral, resp. rate 14, height 5\' 4"  (1.626 m), weight 104.327 kg (230 lb), SpO2 96.00%. Gen: sleepy, easily aroused, riented to self and place at this time HEENT: patch over R eye, oral mucosa moist, no JVD CVS: S1S2/RRR Lungs: CTAB ABd: soft, obese, NT BS present Ext: B/L AKA Neuro: lethargic but moves all extremities Skin: no rash or skin breakdown  Labs on Admission:  Results for orders placed during the hospital encounter of 07/29/12 (from  the past 48 hour(s))  GLUCOSE, CAPILLARY     Status: Abnormal   Collection Time   07/29/12 11:33 AM      Component Value Range Comment   Glucose-Capillary 146 (*) 70 - 99 mg/dL   GLUCOSE, CAPILLARY     Status: Abnormal   Collection Time   07/29/12 12:52 PM      Component Value Range Comment   Glucose-Capillary 134 (*) 70 - 99 mg/dL   GLUCOSE, CAPILLARY     Status: Abnormal   Collection Time   07/29/12  4:06 PM      Component Value Range Comment   Glucose-Capillary 135 (*) 70 - 99 mg/dL    Comment 1 Notify RN       Radiological Exams on Admission: Dg Chest 2 View  07/28/2012  *RADIOLOGY REPORT*  Clinical Data: Hypertension.  Preop respiratory exam.  CHEST - 2 VIEW  Comparison: 06/09/2011  Findings: Low lung volumes are again seen with mild bibasilar atelectasis.  Lung fields otherwise clear.  No evidence  of pleural effusion.  Heart size is prominent stable and likely exaggerated by low lung volumes.  IMPRESSION: Stable low lung volumes and mild bibasilar atelectasis.  No acute findings.  Original Report Authenticated By: Danae Orleans, M.D.    Assessment/Plan  1. Altered mental state: From effects of anesthesia, expect this to completely recover with time Overnight observation  2. Glaucoma and vitreous hemorrhage s/p retinal photocoagulation and vitrectomy per Dr.RAnkin  3. DM: restart Lantus, SSI  4. PVD: wheel chair dependent, lives at home with support of family  Disposition : home with family in am  Time Spent on Admission:  [facility] Pager: [phone] 07/29/2012, 8:30 PM

## 2012-07-29 NOTE — Transfer of Care (Signed)
Immediate Anesthesia Transfer of Care Note  Patient: [name]  Procedure(s) Performed: Procedure(s) (LRB): VITRECTOMY 25 GAUGE WITH BAERVELDT (Right)  Patient Location: PACU  Anesthesia Type: General  Level of Consciousness: awake, alert , oriented and patient cooperative  Airway & Oxygen Therapy: Patient Spontanous Breathing and Patient connected to face mask oxygen  Post-op Assessment: Report given to PACU RN and Patient moving all extremities X 4  Post vital signs: Reviewed and stable  Complications: No apparent anesthesia complications

## 2012-07-29 NOTE — Anesthesia Preprocedure Evaluation (Addendum)
Anesthesia Evaluation  Patient identified by MRN, date of birth, ID band Patient awake    Reviewed: Allergy & Precautions, H&P , NPO status , Patient's Chart, lab work & pertinent test results  History of Anesthesia Complications (+) PONV  Airway Mallampati: II TM Distance: >3 FB Neck ROM: Full    Dental  (+) Edentulous Upper and Edentulous Lower   Pulmonary sleep apnea ,          Cardiovascular hypertension, Pt. on medications + CAD, + Past MI and +CHF     Neuro/Psych TIA   GI/Hepatic GERD-  ,  Endo/Other  Type 2  Renal/GU      Musculoskeletal   Abdominal   Peds  Hematology   Anesthesia Other Findings   Reproductive/Obstetrics                           Anesthesia Physical Anesthesia Plan  ASA: III  Anesthesia Plan:    Post-op Pain Management:    Induction:   Airway Management Planned:   Additional Equipment:   Intra-op Plan:   Post-operative Plan:   Informed Consent:   Plan Discussed with:   Anesthesia Plan Comments:         Anesthesia Quick Evaluation

## 2012-07-29 NOTE — Progress Notes (Signed)
ketoralac given just prior to transport to or.  od

## 2012-07-30 ENCOUNTER — Other Ambulatory Visit: Payer: Self-pay | Admitting: Ophthalmology

## 2012-07-30 LAB — BASIC METABOLIC PANEL
Calcium: 9 mg/dL (ref 8.4–10.5)
GFR calc Af Amer: 45 mL/min — ABNORMAL LOW (ref >90)
GFR calc non Af Amer: 39 mL/min — ABNORMAL LOW (ref >90)
Potassium: 3.8 mEq/L (ref 3.5–5.1)
Sodium: 140 mEq/L (ref 135–145)

## 2012-07-30 LAB — URINALYSIS, ROUTINE W REFLEX MICROSCOPIC
Bilirubin Urine: NEGATIVE
Ketones, ur: NEGATIVE mg/dL
Nitrite: NEGATIVE
Urobilinogen, UA: 0.2 mg/dL (ref 0.0–1.0)
pH: 5 (ref 5.0–8.0)

## 2012-07-30 LAB — CBC
MCHC: 34.3 g/dL (ref 30.0–36.0)
RDW: 12.7 % (ref 11.5–15.5)

## 2012-07-30 LAB — GLUCOSE, CAPILLARY
Glucose-Capillary: 112 mg/dL — ABNORMAL HIGH (ref 70–99)
Glucose-Capillary: 169 mg/dL — ABNORMAL HIGH (ref 70–99)

## 2012-07-30 LAB — URINE MICROSCOPIC-ADD ON

## 2012-07-30 MED ORDER — GATIFLOXACIN 0.5 % OP SOLN OPTIME - NO CHARGE: 1.0000 [drp] | Freq: Three times a day (TID) | OPHTHALMIC | Status: DC

## 2012-07-30 MED ORDER — WHITE PETROLATUM GEL
Status: AC
  Filled 2012-07-30: qty 5

## 2012-07-30 MED ORDER — GATIFLOXACIN 0.5 % OP SOLN: 1.0000 [drp] | Freq: Three times a day (TID) | OPHTHALMIC | Status: DC

## 2012-07-30 NOTE — Op Note (Signed)
[name], EVE                   ACCOUNT NO.:  [id number]  MEDICAL RECORD NO.:  [id number]  LOCATION:  6N24C                        FACILITY:  [facility]  PHYSICIAN:  Alford Highland. Nelta Caudill, M.D.   DATE OF BIRTH:  10/25/1926  DATE OF PROCEDURE:  07/29/2012 DATE OF DISCHARGE:                              OPERATIVE REPORT   PREOPERATIVE DIAGNOSES: 1. Neovascular glaucoma, right eye. 2. Dense vitreous hemorrhage, right eye. 3. 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 4. Monocular status.  POSTOPERATIVE DIAGNOSES: 1. Neovascular glaucoma, right eye. 2. Dense vitreous hemorrhage, right eye. 3. 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 4. Monocular status.  PROCEDURES: 1. Posterior vitrectomy and panretinal endolaser photocoagulation,     right eye. 2. Insertion of aqueous shunt - Baerveldt-102-350 via the pars plana     root. 3. Scleral patch graft. 4. Lateral tarsorrhaphy, right eye.  SURGEON:  Alford Highland. Cadience Bradfield, M.D.  INDICATION FOR PROCEDURE:  The patient is an 78 year old woman with profound vision loss, monocular status, neovascular glaucoma, who requires surgical vitrectomy to allow for the placement of an endolaser photocoagulation to halt neovascular glaucoma, lower the intraocular pressure.  She also requires surgical tube intervention, so as to lower the intraocular pressure long term.  The patient and consents the need for surgical intervention.  They also understand the risks to the eye including but not limited to chronic condition as well as surgical repair, hemorrhage, infection, scarring, need for additional surgery, change in vision, loss of vision, progressive disease despite intervention.  DESCRIPTION OF PROCEDURE:  After appropriate signed consent was obtained, the patient was taken to the operating room.  In the operating room, appropriate anesthesia monitoring was then started.   General anesthesia instilled without difficulty.  Time-out was taken and the entire operating staff were on the operative side of the right eye.  At this time, the right eye was sterilely prepped and draped in the usual ophthalmic fashion.  Lid speculum applied.  Notable findings were of very loose corneal epithelium.  The cornea was 11 mm in diameter.  The central 9 mm was loose, and it was intact but loose superior with a central stromal haze.  At this time, I went ahead and made a small incision and elevated the bleb of loose central epithelium with 0.12 forceps and then simply using Vannas scissors amputated the central corneal epithelium and removed it from the field.  At this time, the view cleared substantially.  Initially, I was going to use iris retractors, however, upon removal of loose epithelium, the view was adequate and did not require further dilation with iris retractors. Attempts at this side had been done and that there was significant posterior synechiae, I elected not to remove the synechiae because the risks of bleeding into the anterior chamber.  I elected instead through the paracentesis to inject the anterior chamber completely with the Viscoat.  At this time, a 25-gauge trocar was placed into the vitreous body.  Placement was verified, infusion turned on.  Superior trocars were applied.  Contemporarily fashioned in the superotemporal quadrant,  extending from the 8 o'clock position, up and around to the 1 o'clock position.  The superotemporal quadrant was entered and the intermuscular septum opened.  The lateral rectus and superior rectus muscles isolated with muscle hooks and then 2-0 silk ties.  At this time, attention was drawn for vitrectomy.  Vitrectomy was easily completed.  There was some peripheral vitreous inferiorly with inspissated hemorrhage, but did require some scleral indentation.  Nonetheless, the remainder of the posterior hyaloid had been  attached nasally to the optic nerve.  This attachment posteriorly was amputated using retractor instrumentation. Vitreous debulked as safely as possible, 360 degrees.  The vitreous base trimmed 360 degrees.  Endolaser photocoagulation was required and necessary in the temporal peripherally, inferotemporally, temporally, and superiorly.  This was carried out without difficulty using endolaser photocoagulation.  The hemostasis was spontaneous posteriorly.  There is extensive peripheral amount of perfusion outside the central macula. The fovea was not only attached but did appear to have some reasonable perfusion.  The nerve was still pink.  At this time, the superior trocars were removed from the eye and these entry sites were closed with 7-0 Vicryl suture.  The Baerveldt valve was then placed in the superotemporal quadrant.  The wings were placed into the lateral and the superior rectus muscles.  Footplate was secured with 8-0 nylon sutures without difficulty.  At this time, appropriate placement of the seton inserted into the pars plana with entry made with an MVR blade, 20- gauge.  The seton was secured and its footplate was also secured with 8- 0 nylon sutures.  Knots were rotated away from the external eye.  At this time, scleral patch graft placed, so the footplate secured with vertical 7-0 Vicryl sutures.  I must note that the BSS had been irrigated through the valve prior to its insertion.  At this time, a ligature of 7-0 Vicryl suture was lightly applied to slow the rate of fluid egress at pressures above 25 mm.  At this time, the conjunctiva and tenons were brought forward and closed with interrupted 7-0 Vicryl sutures.  No complications occurred.  The wound was now checked to be secured.  The infusion was then removed and the scleral site was also closed with 7-0 Vicryl suture.  At this time, an incision was made.  It must be noted that at the onset of the procedure, a bandage  contact lens had been removed from the eye.  A small amount of sterile BSS was placed through a sterile cup and 1 drop of Betadine was applied to the sterile BSS for antibiotic use.  This was the site.  Now, at the end of the procedure, plan was to place this contact lens.  I elected because of the rather prominent lid fissure.  I discussed prior to surgery and I elected to go ahead and perform a lateral tarsorrhaphy.  Using a 11- blade, edge of the posterior lamellae was removed for a length of approximately 3.5 to 4 mm laterally, both on the upper and lower lid. These areas were matched.  Free flowing blood was noted.  At this time, 4-0 nylon suture was then used with a foam bolster in the lower and upper lid to secure the lids together and good apposition was obtained. Appropriate tension was applied.  At this time, the drapes were removed from the eye.  At this time, using sterile technique, I did replaced the bandage contact lens after spreading the lids apart and placing on the cornea  for patient comfort. This would also assist in healing of the corneal epithelium.  At this time, a sterile patch and Fox shield applied.  Patient tolerated the procedure without complication.  The patient was taken to the PACU in good and stable condition after having tolerated the procedure without complication.     Alford Highland Tina Temme, M.D.     GAR/MEDQ  D:  07/29/2012  T:  07/30/2012  Job:  [id number]

## 2012-07-30 NOTE — Progress Notes (Signed)
NO PAIN IN OD, OVERNITE.  LIGHT PERCEPTION VISION OD, WITH INDIRECT.  DAY #1 POST VITRECTOMY, ENDOLASER, BAERVELDT VALVE, AND LATERAL TARSORRHAPHY.  MILD LID EDEMA OD, CONTACT LENS IN PLACE OD, RED REFLEX WITH 20D EXAM.  WILL START TOPICAL EYE MEDS.   DEMONSTRATED TO FAMILY EYE MED APPLICATION, AND AVOID HEAVY LIIFTING, PUSHING.  RV OFFICE IN 2 DAYS  DISCHARGE TO HOME TODAY

## 2012-07-30 NOTE — Discharge Summary (Signed)
Physician Discharge Summary  Patient ID: CATALINA SALASAR MRN: [id number] DOB/AGE: [birth year] 76 y.o.  Admit date: 07/29/2012 Discharge date: 07/30/2012  Admission Diagnoses: VITREOUS HEMORRHAGE RIGHT EYE, NEOVASCULAR GLAUCOMA, CORNEAL SCAR  Discharge Diagnoses: SAME Active Problems:  DM (diabetes mellitus), type 2, uncontrolled with complications  GLAUCOMA  PVD  Coronary artery disease  Altered mental state   Discharged Condition: good  Hospital Course: UNCOMPLICATED  Consults:HOSPITALISTS  Significant Diagnostic Studies: labs:   Treatments: surgery: VITRECTOMY, AQUEOUS SHUNT, LATERAL TARSORRHAPHY RIGHT EYE  Discharge Exam: Blood pressure 141/59, pulse 74, temperature 97.8 F (36.6 C), temperature source Oral, resp. rate 18, height 5\' 4"  (1.626 m), weight 104.327 kg (230 lb), SpO2 97.00%. Eyes: positive findings: conjunctiva: 1+ injection  Disposition: 06-Home-Health Care Svc  Discharge Orders    Future Orders Please Complete By Expires   Diet - low sodium heart healthy      Increase activity slowly      Remove dressing in 24 hours        Medication List  As of 07/30/2012  9:44 AM   STOP taking these medications         dorzolamide-timolol 22.3-6.8 MG/ML ophthalmic solution      multivitamin tablet      travoprost (benzalkonium) 0.004 % ophthalmic solution         TAKE these medications         allopurinol 100 MG tablet   Commonly known as: ZYLOPRIM   Take 100 mg by mouth daily.      amLODipine 10 MG tablet   Commonly known as: NORVASC   Take 10 mg by mouth daily.      furosemide 20 MG tablet   Commonly known as: LASIX   Take 20 mg by mouth daily.      insulin glargine 100 UNIT/ML injection   Commonly known as: LANTUS   Inject 60 Units into the skin every morning.      insulin lispro 100 UNIT/ML injection   Commonly known as: HUMALOG   Inject 5-20 Units into the skin 3 (three) times daily before meals. Sliding scale      moxifloxacin 0.5 %  ophthalmic solution   Commonly known as: VIGAMOX   Place 1 drop into the right eye 4 (four) times daily.      ofloxacin 0.3 % ophthalmic solution   Commonly known as: OCUFLOX   Place 1 drop into the right eye 4 (four) times daily.      pantoprazole 40 MG tablet   Commonly known as: PROTONIX   Take 40 mg by mouth daily.      prednisoLONE acetate 1 % ophthalmic suspension   Commonly known as: PRED FORTE   Place 1 drop into the right eye 4 (four) times daily.      STOOL SOFTENER PO   Take by mouth as needed.      SYSTANE OP   Apply 1 drop to eye as needed. For dry eyes           Follow-up Information    Follow up with [facility], MD. (at [address])    Contact information:   Individual [address] [place] [address] [phone]          Signed: Edmon Crape 07/30/2012, 9:44 AM

## 2012-07-30 NOTE — Progress Notes (Signed)
Dr. Jomarie Longs informed that urine results back, discussed and pt can go home.

## 2012-07-30 NOTE — Progress Notes (Addendum)
2345 Daughter concern because her mother had not had any insulin all day and cbg is now 64. Daughter states she gives her mother 5units on humolog at bedtime when her sugar is over 200. Elray Mcgregor NP notified of daughter concern and orders received to change SSI to ilnclude bedtiime. 0022 Patient received 5units of novolog for cbg of 226 in left arm. Will continue to monitor.

## 2012-08-10 ENCOUNTER — Other Ambulatory Visit: Payer: Self-pay | Admitting: Ophthalmology

## 2012-08-10 ENCOUNTER — Encounter (HOSPITAL_COMMUNITY): Payer: Self-pay | Admitting: Pharmacy Technician

## 2012-08-11 ENCOUNTER — Encounter (HOSPITAL_COMMUNITY): Payer: Self-pay | Admitting: *Deleted

## 2012-08-12 ENCOUNTER — Encounter (HOSPITAL_COMMUNITY)
Admission: RE | Disposition: A | Discharge status: Home / Self Care | Payer: Self-pay | Source: Ambulatory Visit | Attending: Ophthalmology

## 2012-08-12 ENCOUNTER — Ambulatory Visit (HOSPITAL_COMMUNITY): Payer: Medicare Other | Admitting: Anesthesiology

## 2012-08-12 ENCOUNTER — Encounter (HOSPITAL_COMMUNITY): Payer: Self-pay | Admitting: Anesthesiology

## 2012-08-12 ENCOUNTER — Ambulatory Visit (HOSPITAL_COMMUNITY)
Admission: RE | Admit: 2012-08-12 | Discharge: 2012-08-12 | Disposition: A | Discharge status: Home / Self Care | Payer: Medicare Other | Source: Ambulatory Visit | Attending: Ophthalmology | Admitting: Ophthalmology

## 2012-08-12 ENCOUNTER — Encounter (HOSPITAL_COMMUNITY): Payer: Self-pay | Admitting: *Deleted

## 2012-08-12 ENCOUNTER — Other Ambulatory Visit: Payer: Self-pay | Admitting: Ophthalmology

## 2012-08-12 DIAGNOSIS — Z8673 Personal history of transient ischemic attack (TIA), and cerebral infarction without residual deficits: Secondary | ICD-10-CM | POA: Insufficient documentation

## 2012-08-12 DIAGNOSIS — K219 Gastro-esophageal reflux disease without esophagitis: Secondary | ICD-10-CM | POA: Insufficient documentation

## 2012-08-12 DIAGNOSIS — I10 Essential (primary) hypertension: Secondary | ICD-10-CM | POA: Insufficient documentation

## 2012-08-12 DIAGNOSIS — I2510 Atherosclerotic heart disease of native coronary artery without angina pectoris: Secondary | ICD-10-CM | POA: Insufficient documentation

## 2012-08-12 DIAGNOSIS — E118 Type 2 diabetes mellitus with unspecified complications: Secondary | ICD-10-CM

## 2012-08-12 DIAGNOSIS — H409 Unspecified glaucoma: Secondary | ICD-10-CM

## 2012-08-12 DIAGNOSIS — F3289 Other specified depressive episodes: Secondary | ICD-10-CM | POA: Insufficient documentation

## 2012-08-12 DIAGNOSIS — H2100 Hyphema, unspecified eye: Secondary | ICD-10-CM | POA: Insufficient documentation

## 2012-08-12 DIAGNOSIS — E119 Type 2 diabetes mellitus without complications: Secondary | ICD-10-CM | POA: Insufficient documentation

## 2012-08-12 DIAGNOSIS — M129 Arthropathy, unspecified: Secondary | ICD-10-CM | POA: Insufficient documentation

## 2012-08-12 DIAGNOSIS — H4310 Vitreous hemorrhage, unspecified eye: Secondary | ICD-10-CM | POA: Insufficient documentation

## 2012-08-12 DIAGNOSIS — H5460 Unqualified visual loss, one eye, unspecified: Secondary | ICD-10-CM | POA: Insufficient documentation

## 2012-08-12 DIAGNOSIS — F329 Major depressive disorder, single episode, unspecified: Secondary | ICD-10-CM | POA: Insufficient documentation

## 2012-08-12 DIAGNOSIS — E785 Hyperlipidemia, unspecified: Secondary | ICD-10-CM | POA: Insufficient documentation

## 2012-08-12 DIAGNOSIS — Z87440 Personal history of urinary (tract) infections: Secondary | ICD-10-CM | POA: Insufficient documentation

## 2012-08-12 DIAGNOSIS — Z794 Long term (current) use of insulin: Secondary | ICD-10-CM | POA: Insufficient documentation

## 2012-08-12 DIAGNOSIS — I509 Heart failure, unspecified: Secondary | ICD-10-CM | POA: Insufficient documentation

## 2012-08-12 DIAGNOSIS — G4730 Sleep apnea, unspecified: Secondary | ICD-10-CM | POA: Insufficient documentation

## 2012-08-12 DIAGNOSIS — I252 Old myocardial infarction: Secondary | ICD-10-CM | POA: Insufficient documentation

## 2012-08-12 HISTORY — DX: Headache: R51

## 2012-08-12 HISTORY — DX: Shortness of breath: R06.02

## 2012-08-12 HISTORY — DX: Personal history of urinary (tract) infections: Z87.440

## 2012-08-12 HISTORY — DX: Other specified health status: Z78.9

## 2012-08-12 HISTORY — PX: ANTERIOR CHAMBER WASHOUT: SHX5291

## 2012-08-12 HISTORY — DX: Cerebral infarction, unspecified: I63.9

## 2012-08-12 LAB — GLUCOSE, CAPILLARY: Glucose-Capillary: 132 mg/dL — ABNORMAL HIGH (ref 70–99)

## 2012-08-12 SURGERY — WASHOUT, EYE, ANTERIOR CHAMBER
Anesthesia: General | Site: Eye | Laterality: Right | Wound class: Dirty or Infected

## 2012-08-12 MED ORDER — SODIUM CHLORIDE 0.9 % IJ SOLN
125.0000 ug | INTRAMUSCULAR | Status: DC
  Filled 2012-08-12: qty 0.13

## 2012-08-12 MED ORDER — DEXAMETHASONE SODIUM PHOSPHATE 10 MG/ML IJ SOLN
INTRAMUSCULAR | Status: DC | PRN
  Administered 2012-08-12: 10 mg

## 2012-08-12 MED ORDER — EPINEPHRINE HCL 1 MG/ML IJ SOLN
INTRAMUSCULAR | Status: AC
  Filled 2012-08-12: qty 1

## 2012-08-12 MED ORDER — VANCOMYCIN HCL IN DEXTROSE 1-5 GM/200ML-% IV SOLN
INTRAVENOUS | Status: AC
  Filled 2012-08-12: qty 200

## 2012-08-12 MED ORDER — SODIUM CHLORIDE 0.9 % IR SOLN
Status: DC | PRN
  Administered 2012-08-12: 1

## 2012-08-12 MED ORDER — EPHEDRINE SULFATE 50 MG/ML IJ SOLN
INTRAMUSCULAR | Status: DC | PRN
  Administered 2012-08-12: 10 mg via INTRAVENOUS
  Administered 2012-08-12: 5 mg via INTRAVENOUS

## 2012-08-12 MED ORDER — SODIUM HYALURONATE 10 MG/ML IO SOLN
INTRAOCULAR | Status: AC
  Filled 2012-08-12: qty 0.85

## 2012-08-12 MED ORDER — FENTANYL CITRATE 0.05 MG/ML IJ SOLN
INTRAMUSCULAR | Status: AC
  Administered 2012-08-12: 50 ug
  Administered 2012-08-12: 50 ug via INTRAVENOUS
  Filled 2012-08-12: qty 2

## 2012-08-12 MED ORDER — BSS IO SOLN
INTRAOCULAR | Status: DC | PRN
  Administered 2012-08-12: 15 mL via INTRAOCULAR

## 2012-08-12 MED ORDER — TETRACAINE HCL 0.5 % OP SOLN
OPHTHALMIC | Status: AC
  Filled 2012-08-12: qty 2

## 2012-08-12 MED ORDER — PROPOFOL 10 MG/ML IV EMUL
INTRAVENOUS | Status: DC | PRN
  Administered 2012-08-12: 100 mg via INTRAVENOUS

## 2012-08-12 MED ORDER — SODIUM CHLORIDE 0.9 % IV SOLN
INTRAVENOUS | Status: DC
  Administered 2012-08-12: 13:00:00 via INTRAVENOUS

## 2012-08-12 MED ORDER — LIDOCAINE HCL (CARDIAC) 20 MG/ML IV SOLN
INTRAVENOUS | Status: DC | PRN
  Administered 2012-08-12: 40 mg via INTRAVENOUS

## 2012-08-12 MED ORDER — HYPROMELLOSE (GONIOSCOPIC) 2.5 % OP SOLN
OPHTHALMIC | Status: AC
  Filled 2012-08-12: qty 15

## 2012-08-12 MED ORDER — FENTANYL CITRATE 0.05 MG/ML IJ SOLN
INTRAMUSCULAR | Status: DC | PRN
  Administered 2012-08-12: 25 ug via INTRAVENOUS
  Administered 2012-08-12: 50 ug via INTRAVENOUS

## 2012-08-12 MED ORDER — EPINEPHRINE HCL 1 MG/ML IJ SOLN
INTRAOCULAR | Status: DC | PRN
  Administered 2012-08-12: 13:00:00

## 2012-08-12 MED ORDER — CYCLOPENTOLATE HCL 1 % OP SOLN
1.0000 [drp] | OPHTHALMIC | Status: AC | PRN
  Administered 2012-08-12 (×3): 1 [drp] via OPHTHALMIC

## 2012-08-12 MED ORDER — ONDANSETRON HCL 4 MG/2ML IJ SOLN
INTRAMUSCULAR | Status: DC | PRN
  Administered 2012-08-12: 4 mg via INTRAVENOUS

## 2012-08-12 MED ORDER — BSS PLUS IO SOLN
INTRAOCULAR | Status: AC
  Filled 2012-08-12: qty 500

## 2012-08-12 MED ORDER — CYCLOPENTOLATE HCL 1 % OP SOLN
OPHTHALMIC | Status: AC
  Administered 2012-08-12: 1 [drp] via OPHTHALMIC
  Filled 2012-08-12: qty 2

## 2012-08-12 MED ORDER — GATIFLOXACIN 0.5 % OP SOLN
1.0000 [drp] | OPHTHALMIC | Status: AC | PRN
  Administered 2012-08-12 (×3): 1 [drp] via OPHTHALMIC

## 2012-08-12 MED ORDER — DEXAMETHASONE SODIUM PHOSPHATE 10 MG/ML IJ SOLN
INTRAMUSCULAR | Status: AC
  Filled 2012-08-12: qty 1

## 2012-08-12 MED ORDER — PROMETHAZINE HCL 25 MG/ML IJ SOLN: 6.2500 mg | INTRAMUSCULAR | Status: DC | PRN

## 2012-08-12 MED ORDER — HYPROMELLOSE (GONIOSCOPIC) 2.5 % OP SOLN
OPHTHALMIC | Status: DC | PRN
  Administered 2012-08-12: 1 [drp] via OPHTHALMIC

## 2012-08-12 MED ORDER — GATIFLOXACIN 0.5 % OP SOLN
OPHTHALMIC | Status: AC
  Administered 2012-08-12: 1 [drp] via OPHTHALMIC
  Filled 2012-08-12: qty 2.5

## 2012-08-12 MED ORDER — PHENYLEPHRINE HCL 2.5 % OP SOLN
OPHTHALMIC | Status: AC
  Administered 2012-08-12: 1 [drp] via OPHTHALMIC
  Filled 2012-08-12: qty 3

## 2012-08-12 MED ORDER — PHENYLEPHRINE HCL 2.5 % OP SOLN
1.0000 [drp] | OPHTHALMIC | Status: AC | PRN
  Administered 2012-08-12 (×3): 1 [drp] via OPHTHALMIC

## 2012-08-12 MED ORDER — LIDOCAINE HCL 2 % IJ SOLN
INTRAMUSCULAR | Status: AC
  Filled 2012-08-12: qty 1

## 2012-08-12 MED ORDER — NA CHONDROIT SULF-NA HYALURON 40-30 MG/ML IO SOLN
INTRAOCULAR | Status: DC | PRN
  Administered 2012-08-12: 0.5 mL via INTRAOCULAR

## 2012-08-12 MED ORDER — NA CHONDROIT SULF-NA HYALURON 40-30 MG/ML IO SOLN
INTRAOCULAR | Status: AC
  Filled 2012-08-12: qty 0.5

## 2012-08-12 MED ORDER — MIDAZOLAM HCL 2 MG/2ML IJ SOLN: 0.5000 mg | Freq: Once | INTRAMUSCULAR | Status: DC | PRN

## 2012-08-12 MED ORDER — LACTATED RINGERS IV SOLN: INTRAVENOUS | Status: DC | PRN

## 2012-08-12 SURGICAL SUPPLY — 58 items
APL SRG 3 HI ABS STRL LF PLS (MISCELLANEOUS) ×3
APPLICATOR COTTON TIP 6IN STRL (MISCELLANEOUS) ×2 IMPLANT
APPLICATOR DR MATTHEWS STRL (MISCELLANEOUS) ×3 IMPLANT
BLADE MVR KNIFE 20G (BLADE) ×1 IMPLANT
CANNULA ANT CHAM MAIN (OPHTHALMIC RELATED) ×1 IMPLANT
CANNULA FLEX TIP 25G (CANNULA) ×1 IMPLANT
CLOTH BEACON ORANGE TIMEOUT ST (SAFETY) ×2 IMPLANT
COVER MAYO STAND STRL (DRAPES) ×1 IMPLANT
DRAPE OPHTHALMIC 77X100 STRL (CUSTOM PROCEDURE TRAY) ×2 IMPLANT
FILTER BLUE MILLIPORE (MISCELLANEOUS) ×1 IMPLANT
FORCEPS ECKARDT ILM 25G SERR (OPHTHALMIC RELATED) ×1 IMPLANT
FORCEPS HORIZONTAL 25G DISP (OPHTHALMIC RELATED) ×1 IMPLANT
GLOVE BIOGEL PI IND STRL 7.5 (GLOVE) IMPLANT
GLOVE BIOGEL PI INDICATOR 7.5 (GLOVE) ×1
GLOVE ECLIPSE 6.5 STRL STRAW (GLOVE) ×1 IMPLANT
GLOVE SS BIOGEL STRL SZ 6.5 (GLOVE) ×1 IMPLANT
GLOVE SS BIOGEL STRL SZ 8.5 (GLOVE) ×1 IMPLANT
GLOVE SUPERSENSE BIOGEL SZ 6.5 (GLOVE)
GLOVE SUPERSENSE BIOGEL SZ 8.5 (GLOVE) ×1
GLOVE SURG SS PI 6.5 STRL IVOR (GLOVE) ×1 IMPLANT
GOWN EXTRA PROTECTION XL (GOWNS) ×2 IMPLANT
GOWN STRL NON-REIN LRG LVL3 (GOWN DISPOSABLE) ×2 IMPLANT
ILLUMINATOR ENDO 25GA (MISCELLANEOUS) ×1 IMPLANT
KIT PERFLUORON PROCEDURE 5ML (MISCELLANEOUS) IMPLANT
KIT ROOM TURNOVER OR (KITS) ×2 IMPLANT
LENS BIOM SUPER VIEW SET DISP (OPHTHALMIC RELATED) ×2 IMPLANT
LENS CONTACT P50 8.4 DAY/NIGHT (INTRAOCULAR LENS) ×1 IMPLANT
MARKER SKIN DUAL TIP RULER LAB (MISCELLANEOUS) ×2 IMPLANT
MASK EYE SHIELD (GAUZE/BANDAGES/DRESSINGS) ×1 IMPLANT
MICROPICK 25G (MISCELLANEOUS) ×2
NDL 25GX 5/8IN NON SAFETY (NEEDLE) ×1 IMPLANT
NDL FILTER BLUNT 18X1 1/2 (NEEDLE) IMPLANT
NDL HYPO 30X.5 LL (NEEDLE) ×1 IMPLANT
NEEDLE 25GX 5/8IN NON SAFETY (NEEDLE) ×2 IMPLANT
NEEDLE FILTER BLUNT 18X 1/2SAF (NEEDLE) ×1
NEEDLE FILTER BLUNT 18X1 1/2 (NEEDLE) ×1 IMPLANT
NEEDLE HYPO 30X.5 LL (NEEDLE) IMPLANT
NS IRRIG 1000ML POUR BTL (IV SOLUTION) ×2 IMPLANT
PACK VITRECTOMY CUSTOM (CUSTOM PROCEDURE TRAY) ×2 IMPLANT
PAD ARMBOARD 7.5X6 YLW CONV (MISCELLANEOUS) ×4 IMPLANT
PAD EYE OVAL STERILE LF (GAUZE/BANDAGES/DRESSINGS) ×1 IMPLANT
PAK VITRECTOMY PIK 25 GA (OPHTHALMIC RELATED) ×2 IMPLANT
PENCIL BIPOLAR 25GA STR DISP (OPHTHALMIC RELATED) ×1 IMPLANT
PICK MICROPICK 25G (MISCELLANEOUS) ×1 IMPLANT
PROBE DIRECTIONAL LASER (MISCELLANEOUS) ×1 IMPLANT
ROLLS DENTAL (MISCELLANEOUS) ×4 IMPLANT
SCRAPER DIAMOND 25GA (OPHTHALMIC RELATED) ×1 IMPLANT
SET BSS IRRIGATION ADMIN (SET/KITS/TRAYS/PACK) ×2 IMPLANT
STOCKINETTE IMPERVIOUS 9X36 MD (GAUZE/BANDAGES/DRESSINGS) ×4 IMPLANT
SUT ETHILON 10 0 CS140 6 (SUTURE) ×2 IMPLANT
SYR 30ML SLIP (SYRINGE) ×1 IMPLANT
SYR TB 1ML LUER SLIP (SYRINGE) ×2 IMPLANT
TAPE SURG TRANSPORE 1 IN (GAUZE/BANDAGES/DRESSINGS) IMPLANT
TAPE SURGICAL TRANSPORE 1 IN (GAUZE/BANDAGES/DRESSINGS) ×1
TOWEL OR 17X24 6PK STRL BLUE (TOWEL DISPOSABLE) ×4 IMPLANT
TROCAR CANNULA 25GA (CANNULA) ×1 IMPLANT
WATER STERILE IRR 1000ML POUR (IV SOLUTION) ×2 IMPLANT
WIPE INSTRUMENT VISIWIPE 73X73 (MISCELLANEOUS) ×2 IMPLANT

## 2012-08-12 NOTE — Anesthesia Preprocedure Evaluation (Addendum)
Anesthesia Evaluation  Patient identified by MRN, date of birth, ID band Patient awake    Reviewed: Allergy & Precautions, H&P , NPO status , Patient's Chart, lab work & pertinent test results  History of Anesthesia Complications Negative for: history of anesthetic complications  Airway Mallampati: II TM Distance: >3 FB Neck ROM: Full    Dental  (+) Edentulous Upper and Edentulous Lower   Pulmonary shortness of breath and lying, sleep apnea ,  breath sounds clear to auscultation  Pulmonary exam normal       Cardiovascular hypertension, Pt. on medications + CAD (non-obstructive ASCADz by cath) and + Peripheral Vascular Disease (s/p AKA ) - Past MI and - CHF Rate:Normal  '10 ECHO: normal LVF, EF 55-60%   Neuro/Psych TIA   GI/Hepatic Neg liver ROS, GERD-  Medicated and Poorly Controlled,  Endo/Other  Well Controlled, Type 2, Insulin DependentMorbid obesity  Renal/GU negative Renal ROS     Musculoskeletal   Abdominal (+) + obese,   Peds  Hematology   Anesthesia Other Findings   Reproductive/Obstetrics                           Anesthesia Physical Anesthesia Plan  ASA: III  Anesthesia Plan: General   Post-op Pain Management:    Induction: Intravenous  Airway Management Planned: Oral ETT  Additional Equipment:   Intra-op Plan:   Post-operative Plan: Extubation in OR  Informed Consent: I have reviewed the patients History and Physical, chart, labs and discussed the procedure including the risks, benefits and alternatives for the proposed anesthesia with the patient or authorized representative who has indicated his/her understanding and acceptance.     Plan Discussed with: CRNA and Surgeon  Anesthesia Plan Comments: (Plan routine monitors, GETA)        Anesthesia Quick Evaluation

## 2012-08-12 NOTE — Progress Notes (Signed)
Dr. Ephriam Knuckles office called orders still under signed and held.  Dr. Jean Rosenthal call no need to repeat labs okay to use labs from 07-30-2012.  Infection Prevention no need to repeat PCR.

## 2012-08-12 NOTE — Preoperative (Signed)
Beta Blockers   Reason not to administer Beta Blockers:Not Applicable 

## 2012-08-12 NOTE — Brief Op Note (Signed)
08/12/2012  2:25 PM  PATIENT:  [name]  78 y.o. female  PRE-OPERATIVE DIAGNOSIS:  Vision Loss, total hyphema, and secondary glaucoma right eye , history of neovascular glaucoma, iris rubeosis, and recurrent corneal erosion  POST-OPERATIVE DIAGNOSIS:  Vision Loss, secondary glaucoma, vitreous hemorrhage, and corneal epithelial slough right eye  PROCEDURE:  Procedure(s) (LRB): ANTERIOR CHAMBER WASHOUT (Right) therapeutic, vitreous washout, therapeutic via injection of BALANCED SALT SOLUTION,  and debridement of corneal epithelium, and corneal stromal micropuncture right eye.  SURGEON:  Surgeon(s) and Role:    * Edmon Crape, MD - Primary  PHYSICIAN ASSISTANT:   ASSISTANTS: none   ANESTHESIA:   general  EBL:     BLOOD ADMINISTERED:none  DRAINS: none   LOCAL MEDICATIONS USED:  NONE  SPECIMEN:  No Specimen  DISPOSITION OF SPECIMEN:  N/A  COUNTS:  YES  TOURNIQUET:  * No tourniquets in log *  DICTATION: .Other Dictation: Dictation Number [id number]  PLAN OF CARE: Discharge to home after PACU  PATIENT DISPOSITION:  PACU - hemodynamically stable.   Delay start of Pharmacological VTE agent (>24hrs) due to surgical blood loss or risk of bleeding: not applicable, pt is bilateral amputee

## 2012-08-12 NOTE — Transfer of Care (Signed)
Immediate Anesthesia Transfer of Care Note  Patient: [name]  Procedure(s) Performed: Procedure(s) (LRB): ANTERIOR CHAMBER WASHOUT (Right)  Patient Location: PACU  Anesthesia Type: General  Level of Consciousness: awake, alert  and oriented  Airway & Oxygen Therapy: Patient Spontanous Breathing and Patient connected to face mask oxygen  Post-op Assessment: Report given to PACU RN and Post -op Vital signs reviewed and stable  Post vital signs: Reviewed and stable  Complications: No apparent anesthesia complications

## 2012-08-12 NOTE — Anesthesia Procedure Notes (Signed)
Procedure Name: Intubation Date/Time: 08/12/2012 1:43 PM Performed by: Quentin Ore Pre-anesthesia Checklist: Patient identified, Emergency Drugs available, Suction available, Patient being monitored and Timeout performed Patient Re-evaluated:Patient Re-evaluated prior to inductionOxygen Delivery Method: Circle system utilized Preoxygenation: Pre-oxygenation with 100% oxygen Intubation Type: IV induction and Rapid sequence Laryngoscope Size: Mac and 3 Grade View: Grade II Tube size: 7.0 mm Number of attempts: 1 Airway Equipment and Method: Stylet Placement Confirmation: ETT inserted through vocal cords under direct vision,  positive ETCO2 and breath sounds checked- equal and bilateral Secured at: 21 cm Tube secured with: Tape Dental Injury: Teeth and Oropharynx as per pre-operative assessment

## 2012-08-12 NOTE — H&P (Signed)
[name] is an 78 y.o. female.   Chief Complaint: VISION LOSS OD HPI: TOTAL HYPHEMA OD,  POST VITRECTOMY LASER AND GLACUOMA SHUNT VALVE  Past Medical History  Diagnosis Date  . Diabetes mellitus   . Hypertension   . Hyperlipidemia   . GERD (gastroesophageal reflux disease)   . Arthritis   . Anxiety   . Psoriasis   . CHF (congestive heart failure)   . History of detached retina repair     Bilateral  . Complication of anesthesia     History of cardiac arrest after being in admitted to room, pain medication does not agree with patient  . PONV (postoperative nausea and vomiting)   . Myocardial infarction 1980's  . TIA (transient ischemic attack)   . History of blood transfusion     d/t surgery  . Peripheral vascular disease   . Nodule of chest wall 2010    benign mass chest  . CAD (coronary artery disease)   . Depression   . Shortness of breath     WITH EXERT  . Sleep apnea     Does not use CPAP    . History of UTI   . Stroke     MINI STROKES 7-8 YRS AGO  . Headache   . Cancer     Left Breast  . No pertinent past medical history     Past Surgical History  Procedure Date  . Femoral-peroneal bpg  2006    Right leg  . Above knee leg amputation 12/12/09    Left leg  . Abdominal hysterectomy   . Appendectomy   . Eye surgery     Bilateral cataract  . Tonsillectomy and adenoidectomy   . Umbilical hernia repair   . Finger amputation 1997    Left Third digit- distal end  . Above knee leg amputation 06/29/11    Right AKA by Dr. Arbie Cookey  . Breast surgery 1995    Left Breast mastectomy  . No past surgeries     ESOPH  PROCEDURE    History reviewed. No pertinent family history. Social History:  reports that she has never smoked. She does not have any smokeless tobacco history on file. She reports that she does not drink alcohol or use illicit drugs.  Allergies:  Allergies  Allergen Reactions  . Aspirin   . Atorvastatin   . Codeine     Hallucinations/Passes Out     . Demerol (Meperidine)     Hallucinations/Passes Out   . Fentanyl     Hallucinations/Passes Out   . Morphine And Related     Hallucinations/Passes Out   . Nitrofurantoin     REACTION: Trouble breathing, and swallowing.  Marland Kitchen Penicillins   . Rosuvastatin   . Sulfonamide Derivatives     Medications Prior to Admission  Medication Sig Dispense Refill  . allopurinol (ZYLOPRIM) 100 MG tablet Take 100 mg by mouth daily.        Marland Kitchen amLODipine (NORVASC) 10 MG tablet Take 10 mg by mouth daily.        Marland Kitchen docusate sodium (COLACE) 100 MG capsule Take 100 mg by mouth as needed. For constipation.      . furosemide (LASIX) 20 MG tablet Take 20 mg by mouth daily.        Marland Kitchen gatifloxacin (ZYMAXID) 0.5 % SOLN Place 1 drop into the right eye 3 (three) times daily.      . insulin glargine (LANTUS) 100 UNIT/ML injection Inject 60 Units into  the skin every morning.       . insulin lispro (HUMALOG) 100 UNIT/ML injection Inject 5-20 Units into the skin 3 (three) times daily before meals. Sliding scale      . pantoprazole (PROTONIX) 40 MG tablet Take 40 mg by mouth daily.        Bertram Gala Glycol-Propyl Glycol (SYSTANE OP) Apply 1 drop to eye as needed. For dry eyes      . prednisoLONE acetate (PRED FORTE) 1 % ophthalmic suspension Place 1 drop into the right eye 4 (four) times daily.        No results found for this or any previous visit (from the past 48 hour(s)). No results found.  Review of Systems  Constitutional: Negative.   HENT: Negative.   Eyes: Positive for blurred vision.  Respiratory: Negative.   Cardiovascular: Negative.   Gastrointestinal: Negative.   Genitourinary: Negative.   Skin: Negative.   Endo/Heme/Allergies: Negative.     There were no vitals taken for this visit. Physical Exam  Constitutional: Vital signs are normal. She appears well-developed and well-nourished.  HENT:  Head: Normocephalic and atraumatic.  Eyes:         Conjunctival chemosis. LATERAL TARSORRHAPHY SUTURES IN  PLACE VISION OD, IS LIGHT PERCEPTION.  B SCAN OD, NO RETINAL DETACHMENT, NO CHOROIDALS  Neck: Normal range of motion. Neck supple.  Cardiovascular: Normal rate.   Respiratory: Effort normal.  GI: Soft.  Musculoskeletal:       BILATERAL LOWER EXTREMITY AMPUTEE  Neurological: She is alert.  Skin: Skin is warm.  Psychiatric: She has a normal mood and affect. Her behavior is normal. Judgment and thought content normal. Cognition and memory are normal.     Assessment/Plan NEEDS ANTERIOR CHAMBER WASHOUT THERAPEUTIC RIGHT EYE.  REGAIN VISION. WILL ALSO PLACE A BANDAGE CONTACT LENS TO ENCOURAGE EPITHELIAL HEALING  [name] A 08/12/2012, 10:09 AM

## 2012-08-12 NOTE — Anesthesia Postprocedure Evaluation (Signed)
  Anesthesia Post-op Note  Patient: [name]  Procedure(s) Performed: Procedure(s) (LRB): ANTERIOR CHAMBER WASHOUT (Right)  Patient Location: PACU  Anesthesia Type: General  Level of Consciousness: awake, alert  and oriented  Airway and Oxygen Therapy: Patient Spontanous Breathing  Post-op Pain: none  Post-op Assessment: Post-op Vital signs reviewed, Patient's Cardiovascular Status Stable, Respiratory Function Stable, Patent Airway, No signs of Nausea or vomiting and Pain level controlled  Post-op Vital Signs: Reviewed and stable  Complications: No apparent anesthesia complications

## 2012-08-12 NOTE — OR Nursing (Signed)
Preoperative assessment for anterior chamber washout with Fawn Kirk, MD completed by Freida Busman, RN. Charted by Oralia Manis, RN.

## 2012-08-13 NOTE — Op Note (Signed)
[name], [name]                   ACCOUNT NO.:  [id number]  MEDICAL RECORD NO.:  [id number]  LOCATION:  [facility]                         FACILITY:  [facility]  PHYSICIAN:  Jillyn Hidden A. Rankin, M.D.   DATE OF BIRTH:  11/05/1926  DATE OF PROCEDURE:  08/12/2012 DATE OF DISCHARGE:  08/12/2012                              OPERATIVE REPORT   PREOPERATIVE DIAGNOSES: 1. Total hyphema, right eye, nonclotted. 2. History of neovascular glaucoma, right eye. 3. History of recent vitrectomy, endolaser photocoagulation and     insertion of glaucoma shunt, right eye, for neovascular glaucoma,     residual stage. 4. History of corneal epithelium, recurrent corneal erosion, corneal     slough.  POSTOPERATIVE DIAGNOSES: 1. Total hyphema, right eye, nonclotted. 2. History of neovascular glaucoma, right eye. 3. History of recent vitrectomy, endolaser photocoagulation and     insertion of glaucoma shunt, right eye, for neovascular glaucoma,     residual stage. 4. History of corneal epithelium, recurrent corneal erosion, corneal     slough. 5. Mild-to-moderate vitreous hemorrhage.  SURGEON:  Alford Highland. Rankin, M.D.  ANESTHESIA:  General anesthesia.  INDICATION FOR THE PROCEDURE:  The patient is a monocular woman of 78 years of age, with a history of neovascular glaucoma, profound vision loss to right eye, with profound vision loss and threatening total vision loss, who underwent a salvage rescue procedures 2 weeks previous via vitrectomy, insertion of glaucoma shunt and endolaser photocoagulation.  As a consequence or pressure in the eye has improved; however, she has developed postoperative dispersed vitreous and anterior chamber hemorrhage diminishing her vision to the level of count fingers and subsequently to hand motion and now just prior to admission bare light perception.  Because of totality of the hemorrhage, it was felt necessary to recommend anterior chamber washout, possibly vitreous washout so as  to allow for visualization of the posterior anatomy.  The patient understands risk of anesthesia including rare occurrence of death, loss of the eye including but not limited to hemorrhage, infection, scarring, need for another surgery, change in vision, loss of vision, progressive disease despite intervention.  PROCEDURE IN DETAIL:  After appropriate signed consent was obtained, the patient was taken to the operating room.  In the operating room, appropriate monitors followed by mild sedation.  Proper site selection confirmed with the operating staff.  General anesthesia was administered without difficulty.  The right periocular region was again identified with the operating staff as the operative site and time-out, and then right periocular region was sterilely prepped and draped in usual standard fashion.  Lid speculum applied.  Care was taken not to disturb a lateral tarsorrhaphy, which had been placed at the prior procedure. Contact lens on the surface of the eye was identified and this was removed.  Thereafter, a Lewicky anterior chamber maintainer was then placed via inferonasal paracentesis, free-flowing dark nonclotted blood emanated from the anterior chamber.  Lewicky anterior chamber maintainer was then inserted, appropriate pressure was maintained.  A second paracentesis was then fashioned at the temporal 9 o'clock meridian and with an 18-gauge needle in a transcorneal fashion, free flow of aqueous fluid was allowed to proceed and deep  dark but free flowing blood was emanated from the globe and clearing of the view and visualization of the iris were re-obtained.  I did elect the balanced salt solution to clear the view.  I did elect to put a small amount of Provisc into the anterior chamber so as to maintain the anterior chamber depth.  Because of the form and the type of blood in the anterior chamber, I presumed that there was some dispersal through the posterior chamber  as well.  No clotted blood had been seen preoperatively, and so for this reason, just about the 10 o'clock position via the pars plana, a 25- gauge trocar was inserted into the pars plana and free flow of irrigation was allowed to proceed and indeed thin amount of dark hemorrhage nonclotted emanated from the posterior chamber and was free flowing and then quickly cleared to just clear balanced salt solution.  For this reason, at this time, the trocar was removed and the transconjunctival location was immediately injected with Decadron to help with tamponade and prevent bleeding into the globe.  At this time, wounds were secured.  The 18-gauge had been removed, and the lateral paracentesis was spontaneously watertight which was then replaced with a 20-gauge MVR blade initially.  At this time, the infusion pressure was run up briefly, and this allowed removal of the Lewicky from the anterior chamber maintainer.  No complications occurred.  Wound was secured.  A brand new sterile bandage contact lens was obtained from the operating room, and this was placed on the surface of the cornea, but because there was a loose epithelium, I elected at this time to perform a removal of the corneal epithelium.  This is the second time I have seen this occurrence, and because of this, I elected to place micropunctures with a 26-gauge sharp needle bent.  Anterior stromal corneal micropuncture was carried out anteriorly so as to allow and promote corneal epithelial adherence.  Now at this time, a bandage contact lens was placed because the central 7- to 8-mm epithelium had been loose and basically it sloughed in a blister like fashion as one sheet.  At this time, the attention was given to the previous lateral tarsorrhaphy.  The previously placed bolsters were identified.  The sutures were removed, the bolsters were removed, and the lateral tarsorrhaphy remained in place.  The adhesion was secured.  At  this time, the wound was again checked and secured, sterile patch and Fox shield applied.  The patient was awakened from anesthesia and taken to the PACU in good and stable condition.     Alford Highland Rankin, M.D.     GAR/MEDQ  D:  08/12/2012  T:  08/13/2012  Job:  [id number]

## 2012-08-16 ENCOUNTER — Encounter (HOSPITAL_COMMUNITY): Payer: Self-pay | Admitting: Ophthalmology

## 2013-02-17 ENCOUNTER — Ambulatory Visit: Admitting: Cardiology

## 2013-03-17 ENCOUNTER — Emergency Department (HOSPITAL_COMMUNITY)
Admission: EM | Admit: 2013-03-17 | Discharge: 2013-03-17 | Disposition: A | Discharge status: Home / Self Care | Payer: Medicare Other | Attending: Emergency Medicine | Admitting: Emergency Medicine

## 2013-03-17 ENCOUNTER — Emergency Department (HOSPITAL_COMMUNITY): Payer: Medicare Other

## 2013-03-17 ENCOUNTER — Encounter (HOSPITAL_COMMUNITY): Payer: Self-pay | Admitting: Emergency Medicine

## 2013-03-17 DIAGNOSIS — R059 Cough, unspecified: Secondary | ICD-10-CM | POA: Insufficient documentation

## 2013-03-17 DIAGNOSIS — F3289 Other specified depressive episodes: Secondary | ICD-10-CM | POA: Insufficient documentation

## 2013-03-17 DIAGNOSIS — F411 Generalized anxiety disorder: Secondary | ICD-10-CM | POA: Insufficient documentation

## 2013-03-17 DIAGNOSIS — K219 Gastro-esophageal reflux disease without esophagitis: Secondary | ICD-10-CM | POA: Insufficient documentation

## 2013-03-17 DIAGNOSIS — Z853 Personal history of malignant neoplasm of breast: Secondary | ICD-10-CM | POA: Insufficient documentation

## 2013-03-17 DIAGNOSIS — I10 Essential (primary) hypertension: Secondary | ICD-10-CM | POA: Insufficient documentation

## 2013-03-17 DIAGNOSIS — Z79899 Other long term (current) drug therapy: Secondary | ICD-10-CM | POA: Insufficient documentation

## 2013-03-17 DIAGNOSIS — R062 Wheezing: Secondary | ICD-10-CM | POA: Insufficient documentation

## 2013-03-17 DIAGNOSIS — E785 Hyperlipidemia, unspecified: Secondary | ICD-10-CM | POA: Insufficient documentation

## 2013-03-17 DIAGNOSIS — I2510 Atherosclerotic heart disease of native coronary artery without angina pectoris: Secondary | ICD-10-CM | POA: Insufficient documentation

## 2013-03-17 DIAGNOSIS — R05 Cough: Secondary | ICD-10-CM | POA: Insufficient documentation

## 2013-03-17 DIAGNOSIS — F329 Major depressive disorder, single episode, unspecified: Secondary | ICD-10-CM | POA: Insufficient documentation

## 2013-03-17 DIAGNOSIS — E119 Type 2 diabetes mellitus without complications: Secondary | ICD-10-CM | POA: Insufficient documentation

## 2013-03-17 DIAGNOSIS — Z8673 Personal history of transient ischemic attack (TIA), and cerebral infarction without residual deficits: Secondary | ICD-10-CM | POA: Insufficient documentation

## 2013-03-17 DIAGNOSIS — Z872 Personal history of diseases of the skin and subcutaneous tissue: Secondary | ICD-10-CM | POA: Insufficient documentation

## 2013-03-17 DIAGNOSIS — I509 Heart failure, unspecified: Secondary | ICD-10-CM | POA: Insufficient documentation

## 2013-03-17 DIAGNOSIS — I739 Peripheral vascular disease, unspecified: Secondary | ICD-10-CM | POA: Insufficient documentation

## 2013-03-17 DIAGNOSIS — Z8669 Personal history of other diseases of the nervous system and sense organs: Secondary | ICD-10-CM | POA: Insufficient documentation

## 2013-03-17 DIAGNOSIS — Z87440 Personal history of urinary (tract) infections: Secondary | ICD-10-CM | POA: Insufficient documentation

## 2013-03-17 DIAGNOSIS — R0602 Shortness of breath: Secondary | ICD-10-CM

## 2013-03-17 DIAGNOSIS — M129 Arthropathy, unspecified: Secondary | ICD-10-CM | POA: Insufficient documentation

## 2013-03-17 DIAGNOSIS — I252 Old myocardial infarction: Secondary | ICD-10-CM | POA: Insufficient documentation

## 2013-03-17 DIAGNOSIS — IMO0002 Reserved for concepts with insufficient information to code with codable children: Secondary | ICD-10-CM | POA: Insufficient documentation

## 2013-03-17 DIAGNOSIS — Z794 Long term (current) use of insulin: Secondary | ICD-10-CM | POA: Insufficient documentation

## 2013-03-17 LAB — CBC
HCT: 34.6 % — ABNORMAL LOW (ref 36.0–46.0)
MCH: 30.7 pg (ref 26.0–34.0)
MCHC: 34.7 g/dL (ref 30.0–36.0)
MCV: 88.5 fL (ref 78.0–100.0)
RDW: 13.4 % (ref 11.5–15.5)

## 2013-03-17 LAB — BASIC METABOLIC PANEL
BUN: 21 mg/dL (ref 6–23)
Calcium: 9 mg/dL (ref 8.4–10.5)
Creatinine, Ser: 1.53 mg/dL — ABNORMAL HIGH (ref 0.50–1.10)
GFR calc Af Amer: 35 mL/min — ABNORMAL LOW (ref >90)
GFR calc non Af Amer: 30 mL/min — ABNORMAL LOW (ref >90)

## 2013-03-17 MED ORDER — ALBUTEROL SULFATE HFA 108 (90 BASE) MCG/ACT IN AERS: 1.0000 | INHALATION_SPRAY | Freq: Four times a day (QID) | RESPIRATORY_TRACT | Status: DC | PRN

## 2013-03-17 MED ORDER — PREDNISONE 10 MG PO TABS: 20.0000 mg | ORAL_TABLET | Freq: Every day | ORAL | Status: DC

## 2013-03-17 MED ORDER — ALBUTEROL SULFATE (5 MG/ML) 0.5% IN NEBU
5.0000 mg | INHALATION_SOLUTION | Freq: Once | RESPIRATORY_TRACT | Status: AC
  Administered 2013-03-17: 5 mg via RESPIRATORY_TRACT
  Filled 2013-03-17: qty 1

## 2013-03-17 MED ORDER — PREDNISONE 50 MG PO TABS
60.0000 mg | ORAL_TABLET | Freq: Once | ORAL | Status: AC
  Administered 2013-03-17: 60 mg via ORAL
  Filled 2013-03-17: qty 1

## 2013-03-17 NOTE — ED Notes (Signed)
Patient c/o shortness of breath x 1 hour.

## 2013-03-17 NOTE — ED Provider Notes (Signed)
History     CSN: [id number]  Arrival date & time 03/17/13  0108   First MD Initiated Contact with Patient 03/17/13 0133      Chief Complaint  Patient presents with  . Shortness of Breath    (Consider location/radiation/quality/duration/timing/severity/associated sxs/prior treatment) HPI [name] is a 79 y.o. female with a h/o DM, HTN, GERD, anxiety, psoriasis, CHF, TIA, PVD,  who presents to the Emergency Department complaining of shortness of breath x 1 hour. Per daughter patient was recently seen by PCP and started on cough medicine, antibiotic, and mucsinex for the cough she has had off and on for a week.  The cough is worse at night when she goes to bed. Tonight she was lying in the bed and had coughing. She began to wheeze. The wheezing scared her and she began breathing faster which made her short of breath. She denies fever, chills, nausea, vomiting, chest pain.  PCP Dr. Renard Matter Past Medical History  Diagnosis Date  . Diabetes mellitus   . Hypertension   . Hyperlipidemia   . GERD (gastroesophageal reflux disease)   . Arthritis   . Anxiety   . Psoriasis   . CHF (congestive heart failure)   . History of detached retina repair     Bilateral  . Complication of anesthesia     History of cardiac arrest after being in admitted to room, pain medication does not agree with patient  . PONV (postoperative nausea and vomiting)   . Myocardial infarction 1980's  . TIA (transient ischemic attack)   . History of blood transfusion     d/t surgery  . Peripheral vascular disease   . Nodule of chest wall 2010    benign mass chest  . CAD (coronary artery disease)   . Depression   . Shortness of breath     WITH EXERT  . Sleep apnea     Does not use CPAP    . History of UTI   . Stroke     MINI STROKES 7-8 YRS AGO  . Headache   . Cancer     Left Breast  . No pertinent past medical history     Past Surgical History  Procedure Laterality Date  . Femoral-peroneal bpg   2006    Right leg  . 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 Abdominal hysterectomy    . Appendectomy    . Eye surgery      Bilateral cataract  . Tonsillectomy and adenoidectomy    . Umbilical hernia repair    . Finger amputation  1997    Left Third digit- distal end  . Above knee leg amputation  06/29/11    Right AKA by Dr. Arbie Cookey  . Breast surgery  1995    Left Breast mastectomy  . No past surgeries      ES[facility]H  PROCEDURE  . Anterior chamber washout  08/12/2012    Procedure: ANTERIOR CHAMBER WASHOUT;  Surgeon: Edmon Crape, MD;  Location: [facility] OR;  Service: Ophthalmology;  Laterality: Right;  Vitreous Washout    History reviewed. No pertinent family history.  History  Substance Use Topics  . Smoking status: Never Smoker   . Smokeless tobacco: Not on file  . Alcohol Use: No    OB History   Grav Para Term Preterm Abortions TAB SAB Ect Mult Living  Review of Systems  Constitutional: Negative for fever.       10 Systems reviewed and are negative for acute change except as noted in the HPI.  HENT: Negative for congestion.   Eyes: Negative for discharge and redness.  Respiratory: Positive for shortness of breath and wheezing. Negative for cough.   Cardiovascular: Negative for chest pain.  Gastrointestinal: Negative for vomiting and abdominal pain.  Musculoskeletal: Negative for back pain.  Skin: Negative for rash.  Neurological: Negative for syncope, numbness and headaches.  Psychiatric/Behavioral:       No behavior change.    Allergies  Aspirin; Atorvastatin; Codeine; Demerol; Fentanyl; Morphine and related; Nitrofurantoin; Penicillins; Rosuvastatin; and Sulfonamide derivatives  Home Medications   Current Outpatient Rx  Name  Route  Sig  Dispense  Refill  . levofloxacin (LEVAQUIN) 500 MG tablet   Oral   Take 500 mg by mouth daily. started 03/15/13         . albuterol (PROVENTIL HFA;VENTOLIN HFA) 108 (90 BASE) MCG/ACT inhaler   Inhalation    Inhale 1-2 puffs into the lungs every 6 (six) hours as needed for wheezing.   1 Inhaler   0   . allopurinol (ZYLOPRIM) 100 MG tablet   Oral   Take 100 mg by mouth daily.           Marland Kitchen amLODipine (NORVASC) 10 MG tablet   Oral   Take 10 mg by mouth daily.           Marland Kitchen docusate sodium (COLACE) 100 MG capsule   Oral   Take 100 mg by mouth as needed. For constipation.         . furosemide (LASIX) 20 MG tablet   Oral   Take 20 mg by mouth daily.           Marland Kitchen gatifloxacin (ZYMAXID) 0.5 % SOLN   Right Eye   Place 1 drop into the right eye 3 (three) times daily.         . insulin glargine (LANTUS) 100 UNIT/ML injection   Subcutaneous   Inject 60 Units into the skin every morning.          . insulin lispro (HUMALOG) 100 UNIT/ML injection   Subcutaneous   Inject 5-20 Units into the skin 3 (three) times daily before meals. Sliding scale         . pantoprazole (PROTONIX) 40 MG tablet   Oral   Take 40 mg by mouth daily.           Bertram Gala Glycol-Propyl Glycol (SYSTANE OP)   Ophthalmic   Apply 1 drop to eye as needed. For dry eyes         . prednisoLONE acetate (PRED FORTE) 1 % ophthalmic suspension   Right Eye   Place 1 drop into the right eye 4 (four) times daily.         . predniSONE (DELTASONE) 10 MG tablet   Oral   Take 2 tablets (20 mg total) by mouth daily.   10 tablet   0     BP 145/58  Pulse 75  Temp(Src) 98.2 F (36.8 C) (Oral)  Resp 20  Wt 230 lb (104.327 kg)  BMI 39.46 kg/m2  SpO2 91%  Physical Exam  Nursing note and vitals reviewed. Constitutional: She appears well-developed and well-nourished.  Awake, alert, nontoxic appearance.  HENT:  Head: Normocephalic and atraumatic.  Right Ear: External ear normal.  Left Ear: External ear normal.  Mouth/Throat: Oropharynx is clear  and moist.  Eyes: Right eye exhibits no discharge. Left eye exhibits no discharge.  Blind in the right eye  Neck: Neck supple.  Cardiovascular: Normal rate and  intact distal pulses.   Pulmonary/Chest: Effort normal. She has wheezes. She exhibits no tenderness.  Occasional end expiratory wheeze  Abdominal: Soft. There is no tenderness. There is no rebound.  Musculoskeletal: She exhibits no tenderness.  Baseline ROM, no obvious new focal weakness.Bilateral AKAs  Neurological:  Mental status and motor strength appears baseline for patient and situation.  Skin: No rash noted.  Psychiatric: She has a normal mood and affect.    ED Course  Procedures (including critical care time) Results for orders placed during the hospital encounter of 03/17/13  GLUCOSE, CAPILLARY      Result Value Range   Glucose-Capillary 116 (*) 70 - 99 mg/dL   Comment 1 Notify RN    CBC      Result Value Range   WBC 7.3  4.0 - 10.5 K/uL   RBC 3.91  3.87 - 5.11 MIL/uL   Hemoglobin 12.0  12.0 - 15.0 g/dL   HCT 16.1 (*) 09.6 - 04.5 %   MCV 88.5  78.0 - 100.0 fL   MCH 30.7  26.0 - 34.0 pg   MCHC 34.7  30.0 - 36.0 g/dL   RDW 40.9  81.1 - 91.4 %   Platelets 170  150 - 400 K/uL  BASIC METABOLIC PANEL      Result Value Range   Sodium 136  135 - 145 mEq/L   Potassium 4.1  3.5 - 5.1 mEq/L   Chloride 100  96 - 112 mEq/L   CO2 26  19 - 32 mEq/L   Glucose, Bld 113 (*) 70 - 99 mg/dL   BUN 21  6 - 23 mg/dL   Creatinine, Ser 7.82 (*) 0.50 - 1.10 mg/dL   Calcium 9.0  8.4 - 95.6 mg/dL   GFR calc non Af Amer 30 (*) >90 mL/min   GFR calc Af Amer 35 (*) >90 mL/min    Dg Chest Port 1 View  03/17/2013  *RADIOLOGY REPORT*  Clinical Data: Shortness of breath tonight.  PORTABLE CHEST - 1 VIEW  Comparison: 07/28/2012  Findings: Shallow inspiration.  Cardiac enlargement similar to previous study.  Pulmonary vascularity is normal.  There is developing linear atelectasis or infiltration in both mid and lower lungs.  No blunting of costophrenic angles.  No pneumothorax. Prominent right paratracheal soft tissues may represent mediastinal fat versus vascular prominence. This appears stable since  previous study.  Calcified and tortuous aorta.  IMPRESSION: Stable cardiac enlargement with developing infiltration or atelectasis in the mid and lower lungs.   Original Report Authenticated By: Burman Nieves, M.D.     Date: 03/17/2013   0123  Rate: 80  Rhythm: normal sinus rhythm and with 1st degree AV block and occasional PVC  QRS Axis: left  Intervals: normal  ST/T Wave abnormalities: nonspecific T wave changes  Conduction Disutrbances:first-degree A-V block   Narrative Interpretation:   Old EKG Reviewed: unchanged c/w 07/28/12     0230 Patient feeling better after albuterol. 0330 Patient sleeping. Non labored breathing. O2 sats 93%.  MDM  Patient with shortness of breath, wheezing, coughing. Given albuterol with improvement. Given prednisone. Labs unremarkable. Chest xray unremarkable. Patient improved with albuterol. Pt stable in ED with no significant deterioration in condition.The patient appears reasonably screened and/or stabilized for discharge and I doubt any other medical condition or other [facility] requiring further  screening, evaluation, or treatment in the ED at this time prior to discharge.  MDM Reviewed: nursing note and vitals Interpretation: labs, ECG and x-ray           Nicoletta Dress. Colon Branch, MD 03/17/13 [phone]

## 2013-03-17 NOTE — ED Notes (Signed)
Improved s/p neb tx. resp less labored. Pt states she feels better.

## 2013-05-18 ENCOUNTER — Encounter: Payer: Self-pay | Admitting: Cardiology

## 2013-05-18 ENCOUNTER — Ambulatory Visit (INDEPENDENT_AMBULATORY_CARE_PROVIDER_SITE_OTHER): Payer: Medicare Other | Admitting: Cardiology

## 2013-05-18 VITALS — BP 134/68 | HR 76 | Ht 64.0 in | Wt 230.0 lb

## 2013-05-18 DIAGNOSIS — R609 Edema, unspecified: Secondary | ICD-10-CM

## 2013-05-18 DIAGNOSIS — E1351 Other specified diabetes mellitus with diabetic peripheral angiopathy without gangrene: Secondary | ICD-10-CM

## 2013-05-18 DIAGNOSIS — I2510 Atherosclerotic heart disease of native coronary artery without angina pectoris: Secondary | ICD-10-CM

## 2013-05-18 DIAGNOSIS — I10 Essential (primary) hypertension: Secondary | ICD-10-CM

## 2013-05-18 DIAGNOSIS — I739 Peripheral vascular disease, unspecified: Secondary | ICD-10-CM

## 2013-05-18 DIAGNOSIS — I798 Other disorders of arteries, arterioles and capillaries in diseases classified elsewhere: Secondary | ICD-10-CM

## 2013-05-18 NOTE — Progress Notes (Signed)
HPI [name] returns today for the evaluation of her coronary artery disease and history of myocardial infarction. She is a bilateral lower extremity amputee from complications from diabetes. 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 She sits in a wheelchair. She was emergency room in April with some shortness of breath. Blood work was negative. EKG was stable. Chest x-ray showed bilateral atelectasis versus infiltrates. He was not admitted. Her daughter is with her today.  Past Medical History  Diagnosis Date  . Diabetes mellitus   . Hypertension   . Hyperlipidemia   . GERD (gastroesophageal reflux disease)   . Arthritis   . Anxiety   . Psoriasis   . CHF (congestive heart failure)   . History of detached retina repair     Bilateral  . Complication of anesthesia     History of cardiac arrest after being in admitted to room, pain medication does not agree with patient  . PONV (postoperative nausea and vomiting)   . Myocardial infarction 1980's  . TIA (transient ischemic attack)   . History of blood transfusion     d/t surgery  . Peripheral vascular disease   . Nodule of chest [name] 2010    benign mass chest  . CAD (coronary artery disease)   . Depression   . Shortness of breath     WITH EXERT  . Sleep apnea     Does not use CPAP    . History of UTI   . Stroke     MINI STROKES 7-8 YRS AGO  . Headache(784.0)   . Cancer     Left Breast  . No pertinent past medical history     Current Outpatient Prescriptions  Medication Sig Dispense Refill  . albuterol (PROVENTIL HFA;VENTOLIN HFA) 108 (90 BASE) MCG/ACT inhaler Inhale 1-2 puffs into the lungs every 6 (six) hours as needed for wheezing.  1 Inhaler  0  . allopurinol (ZYLOPRIM) 100 MG tablet Take 100 mg by mouth daily.        Marland Kitchen amLODipine (NORVASC) 10 MG tablet Take 10 mg by mouth daily.        Marland Kitchen docusate sodium (COLACE) 100 MG capsule Take 100 mg by mouth as needed. For constipation.      . furosemide (LASIX) 20 MG tablet  Take 20 mg by mouth daily.        . insulin glargine (LANTUS) 100 UNIT/ML injection Inject 60 Units into the skin every morning.       . insulin lispro (HUMALOG) 100 UNIT/ML injection Inject 5-20 Units into the skin 3 (three) times daily before meals. Sliding scale      . pantoprazole (PROTONIX) 40 MG tablet Take 40 mg by mouth daily.         No current facility-administered medications for this visit.    Allergies  Allergen Reactions  . Aspirin   . Atorvastatin   . Codeine     Hallucinations/Passes Out   . Demerol (Meperidine)     Hallucinations/Passes Out   . Fentanyl     Hallucinations/Passes Out   . Morphine And Related     Hallucinations/Passes Out   . Nitrofurantoin     REACTION: Trouble breathing, and swallowing.  Marland Kitchen Penicillins   . Rosuvastatin   . Sulfonamide Derivatives     No family history on file.  History   Social History  . Marital Status: Widowed    Spouse Name: N/A    Number of Children: N/A  . Years  of Education: N/A   Occupational History  . Not on file.   Social History Main Topics  . Smoking status: Never Smoker   . Smokeless tobacco: Not on file  . Alcohol Use: No  . Drug Use: No  . Sexually Active:    Other Topics Concern  . Not on file   Social History Narrative  . No narrative on file    ROS ALL NEGATIVE EXCEPT THOSE NOTED IN HPI  PE  General Appearance: well developed, well nourished in no acute distress, obese, sitting in wheelchair HEENT: symmetrical face, PERRLA, wearing glasses, eyes are red and blind Neck: no JVD, thyromegaly, or adenopathy, trachea midline Chest: symmetric without deformity Cardiac: PMI poorly appreciated RRR, normal S1, S2, no gallop or murmur Lung: clear to ausculation and percussion Vascular: all pulses full without bruits  Abdominal: nondistended, nontender, good bowel sounds, no HSM, no bruits Extremities: Bilateral above-the-knee amputations  Skin: normal color, no rashes Neuro: alert and  oriented x 3, non-focal Pysch: normal affect  EKG Not repeated BMET    Component Value Date/Time   NA 136 03/17/2013 0152   K 4.1 03/17/2013 0152   CL 100 03/17/2013 0152   CO2 26 03/17/2013 0152   GLUCOSE 113* 03/17/2013 0152   BUN 21 03/17/2013 0152   CREATININE 1.53* 03/17/2013 0152   CALCIUM 9.0 03/17/2013 0152   GFRNONAA 30* 03/17/2013 0152   GFRAA 35* 03/17/2013 0152    Lipid Panel  No results found for this basename: chol, trig, hdl, cholhdl, vldl, ldlcalc    CBC    Component Value Date/Time   WBC 7.3 03/17/2013 0152   RBC 3.91 03/17/2013 0152   HGB 12.0 03/17/2013 0152   HCT 34.6* 03/17/2013 0152   PLT 170 03/17/2013 0152   MCV 88.5 03/17/2013 0152   MCH 30.7 03/17/2013 0152   MCHC 34.7 03/17/2013 0152   RDW 13.4 03/17/2013 0152   LYMPHSABS 1.9 06/25/2011 0620   MONOABS 0.6 06/25/2011 0620   EOSABS 0.3 06/25/2011 0620   BASOSABS 0.0 06/25/2011 9528

## 2013-05-18 NOTE — Assessment & Plan Note (Signed)
Stable. Medical therapy only.

## 2013-05-18 NOTE — Patient Instructions (Addendum)
Your physician recommends that you continue on your current medications as directed. Please refer to the Current Medication list given to you today.   Continue to do your deep breathing exercises daily  Your physician wants you to follow-up in: 1 year with Dr. Wyline Mood in [place].  You will receive a reminder letter in the mail two months in advance. If you don't receive a letter, please call our office to schedule the follow-up appointment.

## 2014-03-08 ENCOUNTER — Emergency Department (HOSPITAL_COMMUNITY): Payer: Medicare Other

## 2014-03-08 ENCOUNTER — Emergency Department (HOSPITAL_COMMUNITY)
Admission: EM | Admit: 2014-03-08 | Discharge: 2014-03-08 | Disposition: A | Discharge status: Home / Self Care | Payer: Medicare Other | Attending: Emergency Medicine | Admitting: Emergency Medicine

## 2014-03-08 ENCOUNTER — Encounter (HOSPITAL_COMMUNITY): Payer: Self-pay | Admitting: Emergency Medicine

## 2014-03-08 DIAGNOSIS — Z79899 Other long term (current) drug therapy: Secondary | ICD-10-CM | POA: Insufficient documentation

## 2014-03-08 DIAGNOSIS — I2510 Atherosclerotic heart disease of native coronary artery without angina pectoris: Secondary | ICD-10-CM | POA: Insufficient documentation

## 2014-03-08 DIAGNOSIS — Z9889 Other specified postprocedural states: Secondary | ICD-10-CM | POA: Insufficient documentation

## 2014-03-08 DIAGNOSIS — Z794 Long term (current) use of insulin: Secondary | ICD-10-CM | POA: Insufficient documentation

## 2014-03-08 DIAGNOSIS — Z872 Personal history of diseases of the skin and subcutaneous tissue: Secondary | ICD-10-CM | POA: Insufficient documentation

## 2014-03-08 DIAGNOSIS — Z9189 Other specified personal risk factors, not elsewhere classified: Secondary | ICD-10-CM | POA: Insufficient documentation

## 2014-03-08 DIAGNOSIS — Z880 Allergy status to penicillin: Secondary | ICD-10-CM | POA: Insufficient documentation

## 2014-03-08 DIAGNOSIS — R51 Headache: Secondary | ICD-10-CM | POA: Insufficient documentation

## 2014-03-08 DIAGNOSIS — Z8679 Personal history of other diseases of the circulatory system: Secondary | ICD-10-CM | POA: Insufficient documentation

## 2014-03-08 DIAGNOSIS — Z8673 Personal history of transient ischemic attack (TIA), and cerebral infarction without residual deficits: Secondary | ICD-10-CM | POA: Insufficient documentation

## 2014-03-08 DIAGNOSIS — F411 Generalized anxiety disorder: Secondary | ICD-10-CM | POA: Insufficient documentation

## 2014-03-08 DIAGNOSIS — I509 Heart failure, unspecified: Secondary | ICD-10-CM | POA: Insufficient documentation

## 2014-03-08 DIAGNOSIS — Z8659 Personal history of other mental and behavioral disorders: Secondary | ICD-10-CM | POA: Insufficient documentation

## 2014-03-08 DIAGNOSIS — R300 Dysuria: Secondary | ICD-10-CM | POA: Insufficient documentation

## 2014-03-08 DIAGNOSIS — Z8739 Personal history of other diseases of the musculoskeletal system and connective tissue: Secondary | ICD-10-CM | POA: Insufficient documentation

## 2014-03-08 DIAGNOSIS — H548 Legal blindness, as defined in USA: Secondary | ICD-10-CM | POA: Insufficient documentation

## 2014-03-08 DIAGNOSIS — E119 Type 2 diabetes mellitus without complications: Secondary | ICD-10-CM | POA: Insufficient documentation

## 2014-03-08 DIAGNOSIS — I10 Essential (primary) hypertension: Secondary | ICD-10-CM | POA: Insufficient documentation

## 2014-03-08 DIAGNOSIS — J40 Bronchitis, not specified as acute or chronic: Secondary | ICD-10-CM

## 2014-03-08 DIAGNOSIS — K219 Gastro-esophageal reflux disease without esophagitis: Secondary | ICD-10-CM | POA: Insufficient documentation

## 2014-03-08 DIAGNOSIS — I252 Old myocardial infarction: Secondary | ICD-10-CM | POA: Insufficient documentation

## 2014-03-08 DIAGNOSIS — Z853 Personal history of malignant neoplasm of breast: Secondary | ICD-10-CM | POA: Insufficient documentation

## 2014-03-08 DIAGNOSIS — Z87440 Personal history of urinary (tract) infections: Secondary | ICD-10-CM | POA: Insufficient documentation

## 2014-03-08 DIAGNOSIS — R21 Rash and other nonspecific skin eruption: Secondary | ICD-10-CM | POA: Insufficient documentation

## 2014-03-08 LAB — CBC WITH DIFFERENTIAL/PLATELET
Basophils Absolute: 0 10*3/uL (ref 0.0–0.1)
Basophils Relative: 0 % (ref 0–1)
EOS ABS: 0.3 10*3/uL (ref 0.0–0.7)
EOS PCT: 4 % (ref 0–5)
HCT: 35.8 % — ABNORMAL LOW (ref 36.0–46.0)
HEMOGLOBIN: 12.1 g/dL (ref 12.0–15.0)
LYMPHS ABS: 2.1 10*3/uL (ref 0.7–4.0)
Lymphocytes Relative: 29 % (ref 12–46)
MCH: 30.3 pg (ref 26.0–34.0)
MCHC: 33.8 g/dL (ref 30.0–36.0)
MCV: 89.5 fL (ref 78.0–100.0)
MONO ABS: 0.4 10*3/uL (ref 0.1–1.0)
MONOS PCT: 5 % (ref 3–12)
Neutro Abs: 4.5 10*3/uL (ref 1.7–7.7)
Neutrophils Relative %: 61 % (ref 43–77)
Platelets: 156 10*3/uL (ref 150–400)
RBC: 4 MIL/uL (ref 3.87–5.11)
RDW: 13.1 % (ref 11.5–15.5)
WBC: 7.3 10*3/uL (ref 4.0–10.5)

## 2014-03-08 LAB — URINE MICROSCOPIC-ADD ON

## 2014-03-08 LAB — URINALYSIS, ROUTINE W REFLEX MICROSCOPIC
BILIRUBIN URINE: NEGATIVE
Glucose, UA: NEGATIVE mg/dL
KETONES UR: NEGATIVE mg/dL
Leukocytes, UA: NEGATIVE
NITRITE: NEGATIVE
PH: 5.5 (ref 5.0–8.0)
Protein, ur: 100 mg/dL — AB
Specific Gravity, Urine: >1.03 — ABNORMAL HIGH (ref 1.005–1.030)
Urobilinogen, UA: 0.2 mg/dL (ref 0.0–1.0)

## 2014-03-08 LAB — BASIC METABOLIC PANEL
BUN: 18 mg/dL (ref 6–23)
CALCIUM: 9.2 mg/dL (ref 8.4–10.5)
CO2: 28 mEq/L (ref 19–32)
CREATININE: 1.59 mg/dL — AB (ref 0.50–1.10)
Chloride: 102 mEq/L (ref 96–112)
GFR, EST AFRICAN AMERICAN: 33 mL/min — AB (ref >90)
GFR, EST NON AFRICAN AMERICAN: 28 mL/min — AB (ref >90)
GLUCOSE: 144 mg/dL — AB (ref 70–99)
Potassium: 4.2 mEq/L (ref 3.7–5.3)
Sodium: 141 mEq/L (ref 137–147)

## 2014-03-08 LAB — TROPONIN I: Troponin I: <0.3 ng/mL (ref <0.30)

## 2014-03-08 LAB — PRO B NATRIURETIC PEPTIDE: Pro B Natriuretic peptide (BNP): 1211 pg/mL — ABNORMAL HIGH (ref 0–450)

## 2014-03-08 MED ORDER — DM-GUAIFENESIN ER 30-600 MG PO TB12: 1.0000 | ORAL_TABLET | Freq: Two times a day (BID) | ORAL | Status: DC

## 2014-03-08 NOTE — ED Notes (Signed)
MD at bedside. 

## 2014-03-08 NOTE — ED Provider Notes (Signed)
CSN: [id number]     Arrival date & time 03/08/14  1711 History   None    This chart was scribed for Mervin Kung, MD by Terressa Koyanagi, ED Scribe. This patient was seen in room APA18/AP[facility]18 and the patient's care was started at 6:06 PM.  Chief Complaint  Patient presents with  . Shortness of Breath   Patient is a 80 y.o. female presenting with shortness of breath. The history is provided by the patient and a relative. No language interpreter was used.  Shortness of Breath Associated symptoms: abdominal pain, chest pain, cough, fever, headaches (mild ha onset this evening) and rash (history of psoriasis)   Associated symptoms: no vomiting    HPI Comments: [name] is a 80 y.o. female brought in by EMS, with a history of DM, HTN, HLD, GERD, MI, TIA, breast cancer, and SOB with exert and stroke, who presents to the Emergency Department complaining of intermittent, acute, worsening cough onset one week ago. Pt also complains of associated intermittent acute chest pain and SOB onset one week ago. Pt states the chest pain is made worse by coughing. Pt also complains of associated abdominal swelling with some abdominal discomfort onset one week ago. Pt states she had a fever. Pt also states that she feels listless and having difficulty getting out of bed due to fatigue. Pt states that she was recently put on oxygen and it has helped her feel better.    Past Medical History  Diagnosis Date  . Diabetes mellitus   . Hypertension   . Hyperlipidemia   . GERD (gastroesophageal reflux disease)   . Arthritis   . Anxiety   . Psoriasis   . CHF (congestive heart failure)   . History of detached retina repair     Bilateral  . Complication of anesthesia     History of cardiac arrest after being in admitted to room, pain medication does not agree with patient  . PONV (postoperative nausea and vomiting)   . Myocardial infarction 1980's  . TIA (transient ischemic attack)   . History of blood  transfusion     d/t surgery  . Peripheral vascular disease   . Nodule of chest wall 2010    benign mass chest  . CAD (coronary artery disease)   . Depression   . Shortness of breath     WITH EXERT  . Sleep apnea     Does not use CPAP    . History of UTI   . Stroke     MINI STROKES 7-8 YRS AGO  . Headache(784.0)   . Cancer     Left Breast  . No pertinent past medical history    Past Surgical History  Procedure Laterality Date  . Femoral-peroneal bpg   2006    Right leg  . 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 Abdominal hysterectomy    . Appendectomy    . Eye surgery      Bilateral cataract  . Tonsillectomy and adenoidectomy    . Umbilical hernia repair    . Finger amputation  1997    Left Third digit- distal end  . Above knee leg amputation  06/29/11    Right AKA by Dr. Donnetta Hutching  . Breast surgery  1995    Left Breast mastectomy  . No past surgeries      ESOPH  PROCEDURE  . Anterior chamber washout  08/12/2012    Procedure: ANTERIOR CHAMBER  WASHOUT;  Surgeon: Hurman Horn, MD;  Location: [facility];  Service: Ophthalmology;  Laterality: Right;  Vitreous Washout   No family history on file. History  Substance Use Topics  . Smoking status: Never Smoker   . Smokeless tobacco: Not on file  . Alcohol Use: No   OB History   Grav Para Term Preterm Abortions TAB SAB Ect Mult Living                 Review of Systems  Constitutional: Positive for fever and chills.  HENT: Negative for rhinorrhea.   Eyes:       Legally blind.   Respiratory: Positive for cough and shortness of breath.   Cardiovascular: Positive for chest pain.  Gastrointestinal: Positive for abdominal pain. Negative for nausea, vomiting and diarrhea.  Genitourinary: Positive for dysuria.  Musculoskeletal:       Bilateral above knee amputation  Skin: Positive for rash (history of psoriasis).  Neurological: Positive for headaches (mild ha onset this evening).   Allergies  Aspirin;  Atorvastatin; Codeine; Demerol; Fentanyl; Morphine and related; Nitrofurantoin; Penicillins; Rosuvastatin; and Sulfonamide derivatives  Home Medications   Current Outpatient Rx  Name  Route  Sig  Dispense  Refill  . acetaminophen (TYLENOL) 500 MG tablet   Oral   Take 500 mg by mouth daily as needed for mild pain or moderate pain.         Marland Kitchen albuterol (PROAIR HFA) 108 (90 BASE) MCG/ACT inhaler   Inhalation   Inhale 2 puffs into the lungs every 6 (six) hours as needed for wheezing or shortness of breath.         . allopurinol (ZYLOPRIM) 300 MG tablet   Oral   Take 300 mg by mouth every other day.         Marland Kitchen amLODipine (NORVASC) 10 MG tablet   Oral   Take 10 mg by mouth every morning.          . docusate sodium (COLACE) 100 MG capsule   Oral   Take 100 mg by mouth as needed. For constipation.         . dorzolamide-timolol (COSOPT) 22.3-6.8 MG/ML ophthalmic solution   Both Eyes   Place 1 drop into both eyes 2 (two) times daily.         . furosemide (LASIX) 20 MG tablet   Oral   Take 20 mg by mouth daily.           . insulin glargine (LANTUS) 100 UNIT/ML injection   Subcutaneous   Inject 60-62 Units into the skin every morning.          . insulin lispro (HUMALOG) 100 UNIT/ML injection   Subcutaneous   Inject 5-20 Units into the skin 2 (two) times daily with a meal. Sliding scale         . LORazepam (ATIVAN) 0.5 MG tablet   Oral   Take 0.5 mg by mouth every 4 (four) hours as needed for anxiety.         . Multiple Vitamins-Minerals (CENTRUM SILVER ADULT 50+ PO)   Oral   Take 1 tablet by mouth daily.         Marland Kitchen nystatin cream (MYCOSTATIN)   Topical   Apply 1 application topically daily. *Applied under breast         . pantoprazole (PROTONIX) 40 MG tablet   Oral   Take 40 mg by mouth every other day.          Marland Kitchen  dextromethorphan-guaiFENesin (MUCINEX DM) 30-600 MG per 12 hr tablet   Oral   Take 1 tablet by mouth 2 (two) times daily.   14  tablet   1    TRIAGE VITALS: BP 142/56  Pulse 80  Temp(Src) 98.4 F (36.9 C) (Oral)  Resp 18  SpO2 93%  Physical Exam  Nursing note and vitals reviewed. Constitutional: She is oriented to person, place, and time. She appears well-developed and well-nourished. No distress.  HENT:  Head: Normocephalic and atraumatic.  Eyes:  Cornea, right eye clouded  Pt legally blind  Neck: Neck supple. No tracheal deviation present.  Cardiovascular: Normal rate and regular rhythm.   Pulmonary/Chest: Effort normal. No respiratory distress.  Abdominal: Bowel sounds are normal. There is no tenderness.  Musculoskeletal: Normal range of motion.  Bilateral above knee amputation   Neurological: She is alert and oriented to person, place, and time.  Skin: Skin is warm and dry.  Psychiatric: She has a normal mood and affect. Her behavior is normal.    ED Course  Procedures (including critical care time) DIAGNOSTIC STUDIES: Oxygen Saturation is 93% on RA, low by my interpretation.    COORDINATION OF CARE: 6:17 PM-Discussed treatment plan which includes UA, blood work and xrays with pt at bedside and pt agreed to plan.   Labs Review Labs Reviewed  URINALYSIS, ROUTINE W REFLEX MICROSCOPIC - Abnormal; Notable for the following:    APPearance HAZY (*)    Specific Gravity, Urine >1.030 (*)    Hgb urine dipstick SMALL (*)    Protein, ur 100 (*)    All other components within normal limits  CBC WITH DIFFERENTIAL - Abnormal; Notable for the following:    HCT 35.8 (*)    All other components within normal limits  BASIC METABOLIC PANEL - Abnormal; Notable for the following:    Glucose, Bld 144 (*)    Creatinine, Ser 1.59 (*)    GFR calc non Af Amer 28 (*)    GFR calc Af Amer 33 (*)    All other components within normal limits  PRO B NATRIURETIC PEPTIDE - Abnormal; Notable for the following:    Pro B Natriuretic peptide (BNP) 1211.0 (*)    All other components within normal limits  URINE  MICROSCOPIC-ADD ON - Abnormal; Notable for the following:    Squamous Epithelial / LPF FEW (*)    Bacteria, UA MANY (*)    Casts HYALINE CASTS (*)    All other components within normal limits  TROPONIN I   Results for orders placed during the hospital encounter of 03/08/14  TROPONIN I      Result Value Ref Range   Troponin I <0.30  <0.30 ng/mL  URINALYSIS, ROUTINE W REFLEX MICROSCOPIC      Result Value Ref Range   Color, Urine YELLOW  YELLOW   APPearance HAZY (*) CLEAR   Specific Gravity, Urine >1.030 (*) 1.005 - 1.030   pH 5.5  5.0 - 8.0   Glucose, UA NEGATIVE  NEGATIVE mg/dL   Hgb urine dipstick SMALL (*) NEGATIVE   Bilirubin Urine NEGATIVE  NEGATIVE   Ketones, ur NEGATIVE  NEGATIVE mg/dL   Protein, ur 100 (*) NEGATIVE mg/dL   Urobilinogen, UA 0.2  0.0 - 1.0 mg/dL   Nitrite NEGATIVE  NEGATIVE   Leukocytes, UA NEGATIVE  NEGATIVE  CBC WITH DIFFERENTIAL      Result Value Ref Range   WBC 7.3  4.0 - 10.5 K/uL   RBC 4.00  3.87 -  5.11 MIL/uL   Hemoglobin 12.1  12.0 - 15.0 g/dL   HCT 35.8 (*) 36.0 - 46.0 %   MCV 89.5  78.0 - 100.0 fL   MCH 30.3  26.0 - 34.0 pg   MCHC 33.8  30.0 - 36.0 g/dL   RDW 13.1  11.5 - 15.5 %   Platelets 156  150 - 400 K/uL   Neutrophils Relative % 61  43 - 77 %   Neutro Abs 4.5  1.7 - 7.7 K/uL   Lymphocytes Relative 29  12 - 46 %   Lymphs Abs 2.1  0.7 - 4.0 K/uL   Monocytes Relative 5  3 - 12 %   Monocytes Absolute 0.4  0.1 - 1.0 K/uL   Eosinophils Relative 4  0 - 5 %   Eosinophils Absolute 0.3  0.0 - 0.7 K/uL   Basophils Relative 0  0 - 1 %   Basophils Absolute 0.0  0.0 - 0.1 K/uL  BASIC METABOLIC PANEL      Result Value Ref Range   Sodium 141  137 - 147 mEq/L   Potassium 4.2  3.7 - 5.3 mEq/L   Chloride 102  96 - 112 mEq/L   CO2 28  19 - 32 mEq/L   Glucose, Bld 144 (*) 70 - 99 mg/dL   BUN 18  6 - 23 mg/dL   Creatinine, Ser 1.59 (*) 0.50 - 1.10 mg/dL   Calcium 9.2  8.4 - 10.5 mg/dL   GFR calc non Af Amer 28 (*) >90 mL/min   GFR calc Af Amer  33 (*) >90 mL/min  PRO B NATRIURETIC PEPTIDE      Result Value Ref Range   Pro B Natriuretic peptide (BNP) 1211.0 (*) 0 - 450 pg/mL  URINE MICROSCOPIC-ADD ON      Result Value Ref Range   Squamous Epithelial / LPF FEW (*) RARE   WBC, UA 0-2  <3 WBC/hpf   RBC / HPF 0-2  <3 RBC/hpf   Bacteria, UA MANY (*) RARE   Casts HYALINE CASTS (*) NEGATIVE    Imaging Review Dg Chest 2 View  03/08/2014   CLINICAL DATA:  Shortness of breath.  EXAM: CHEST  2 VIEW  COMPARISON:  DG CHEST 1V PORT dated 03/17/2013  FINDINGS: Mediastinum is stable, its prominence most likely secondary to prominent great vessels, and is unchanged. Stable cardiomegaly. No pulmonary venous congestion. Poor inspiration with mild basilar atelectasis. No pleural effusion or pneumothorax. Degenerative changes thoracic spine and both shoulders.  IMPRESSION: 1. Stable cardiomegaly. 2. Poor inspiration with mild bibasilar atelectasis.   Electronically Signed   By: [place]   On: 03/08/2014 19:16   Ct Chest Wo Contrast  03/08/2014   CLINICAL DATA:  Shortness of breath.  History of breast cancer.  EXAM: CT CHEST WITHOUT CONTRAST  TECHNIQUE: Multidetector CT imaging of the chest was performed following the standard protocol without IV contrast.  COMPARISON:  DG CHEST 2 VIEW dated 03/08/2014; CT ANGIO CHEST W/CM &/OR WO/CM dated 04/24/2010  FINDINGS: Right paratracheal mediastinal 4.2 x 4.9 x 5.6 cm (transverse by AP by CC) mass is contiguous with anterolateral aspect of the trachea. Mass extends towards the carina and is relatively unchanged in size, morphology. 18 Hounsfield units, previously 19 Hounsfield units on contrast-enhanced examination with nonenhancing cystic lesion. The mass appears smoothly marginated, without bronchial wall invasion.  Heart is mildly enlarged, pericardium is nonsuspicious. Thoracic aorta is normal in course and caliber with moderate calcific atherosclerosis.  See  small bilateral pleural effusions with dependent  atelectasis. Trace bronchial wall thickening. No focal consolidation. No pneumothorax.  Thoracic esophagus is nonsuspicious. Multiple small gallstones without superimposed CT findings of acute cholecystitis. Left thyroid lobe appears enlarged with coarse calcification which be better characterized on thyroid sonography clinically indicated. Severe bilateral degenerative changes-shoulders. Moderate to severe degenerative change of thoracic spine.  IMPRESSION: Stable 4.2 x 4.9 x 5.6 cm cystic mediastinal mass, considering chronicity this is likely benign.  Stable mild cardiomegaly, trace pleural effusions and dependent atelectasis. Minimal bronchial wall thickening could reflect bronchitis.   Electronically Signed   By: Elon Alas   On: 03/08/2014 20:32     EKG Interpretation   Date/Time:  Thursday March 08 2014 18:30:57 EDT Ventricular Rate:  75 PR Interval:  342 QRS Duration: 102 QT Interval:  398 QTC Calculation: 444 R Axis:   -37 Text Interpretation:  Sinus rhythm with 1st degree A-V block with  occasional Premature ventricular complexes Left axis deviation Septal  infarct , age undetermined Abnormal ECG When compared with ECG of  17-Mar-2013 01:23, No significant change was found Confirmed by Kailei Cowens   MD, Broly Hatfield [phone]) on 03/08/2014 6:38:58 PM      MDM   Final diagnoses:  Bronchitis    Extensive workup was ruled out the pneumonia. CT consistent with bronchitis. They'll recommend albuterol inhaler which patient RE has and Mucinex DM. Troponin was negative EKG without acute changes no evidence of an acute cardiac event over the past few days. As stated patient's symptoms are consistent with the upper respiratory infection or pneumonia or bronchitis and CT scan rules out pneumonia.   I personally performed the services described in this documentation, which was scribed in my presence. The recorded information has been reviewed and is accurate.     Mervin Kung,  MD 03/08/14 2123

## 2014-03-08 NOTE — ED Notes (Signed)
EMS reports pt has had an ongoing dry cough but this week has been more sob, having chest pain and abd swelling.  Pt legally blind.

## 2014-03-08 NOTE — ED Notes (Signed)
Pt reports she sees strange things at times when she is in bed.  Reports sees trucks backing up to her bed, men standing around her bed, and monsters around her bed.  Denies si or hi.  Pt says it is coming from the devil.  Pt says has been seeing these things for the past year.  Denies hearing voices.

## 2014-03-08 NOTE — Discharge Instructions (Signed)
Bronchitis Bronchitis is swelling (inflammation) of the air tubes leading to your lungs (bronchi). This causes mucus and a cough. If the swelling gets bad, you may have trouble breathing. HOME CARE   Rest.  Drink enough fluids to keep your pee (urine) clear or pale yellow (unless you have a condition where you have to watch how much you drink).  Only take medicine as told by your doctor. If you were given antibiotic medicines, finish them even if you start to feel better.  Avoid smoke, irritating chemicals, and strong smells. These make the problem worse. Quit smoking if you smoke. This helps your lungs heal faster.  Use a cool mist humidifier. Change the water in the humidifier every day. You can also sit in the bathroom with hot shower running for 5 10 minutes. Keep the door closed.  See your health care provider as told.  Wash your hands often. GET HELP IF: Your problems do not get better after 1 week. GET HELP RIGHT AWAY IF:   Your fever gets worse.  You have chills.  Your chest hurts.  Your problems breathing get worse.  You have blood in your mucus.  You pass out (faint).  You feel lightheaded.  You have a bad headache.  You throw up (vomit) again and again. MAKE SURE YOU:  Understand these instructions.  Will watch your condition.  Will get help right away if you are not doing well or get worse. Document Released: 05/18/2008 Document Revised: 09/20/2013 Document Reviewed: 07/25/2013 [facility] Patient Information 2014 [place], [place]. Continue albuterol inhaler 2 puffs every 6 hours. Take the Mucinex DM as directed and as needed for cough and congestion. Followup with your doctor as needed. Return for any new or worse symptoms.

## 2014-03-10 ENCOUNTER — Emergency Department (HOSPITAL_COMMUNITY): Payer: Medicare Other

## 2014-03-10 ENCOUNTER — Emergency Department (HOSPITAL_COMMUNITY)
Admission: EM | Admit: 2014-03-10 | Discharge: 2014-03-10 | Disposition: A | Discharge status: Home / Self Care | Payer: Medicare Other | Attending: Emergency Medicine | Admitting: Emergency Medicine

## 2014-03-10 ENCOUNTER — Encounter (HOSPITAL_COMMUNITY): Payer: Self-pay | Admitting: Emergency Medicine

## 2014-03-10 DIAGNOSIS — R0989 Other specified symptoms and signs involving the circulatory and respiratory systems: Principal | ICD-10-CM | POA: Insufficient documentation

## 2014-03-10 DIAGNOSIS — Z794 Long term (current) use of insulin: Secondary | ICD-10-CM | POA: Insufficient documentation

## 2014-03-10 DIAGNOSIS — Z87440 Personal history of urinary (tract) infections: Secondary | ICD-10-CM | POA: Insufficient documentation

## 2014-03-10 DIAGNOSIS — R141 Gas pain: Secondary | ICD-10-CM | POA: Insufficient documentation

## 2014-03-10 DIAGNOSIS — R142 Eructation: Secondary | ICD-10-CM | POA: Insufficient documentation

## 2014-03-10 DIAGNOSIS — R0600 Dyspnea, unspecified: Secondary | ICD-10-CM

## 2014-03-10 DIAGNOSIS — Z8673 Personal history of transient ischemic attack (TIA), and cerebral infarction without residual deficits: Secondary | ICD-10-CM | POA: Insufficient documentation

## 2014-03-10 DIAGNOSIS — F3289 Other specified depressive episodes: Secondary | ICD-10-CM | POA: Insufficient documentation

## 2014-03-10 DIAGNOSIS — F329 Major depressive disorder, single episode, unspecified: Secondary | ICD-10-CM | POA: Insufficient documentation

## 2014-03-10 DIAGNOSIS — Z880 Allergy status to penicillin: Secondary | ICD-10-CM | POA: Insufficient documentation

## 2014-03-10 DIAGNOSIS — I10 Essential (primary) hypertension: Secondary | ICD-10-CM | POA: Insufficient documentation

## 2014-03-10 DIAGNOSIS — I252 Old myocardial infarction: Secondary | ICD-10-CM | POA: Insufficient documentation

## 2014-03-10 DIAGNOSIS — Z79899 Other long term (current) drug therapy: Secondary | ICD-10-CM | POA: Insufficient documentation

## 2014-03-10 DIAGNOSIS — Z8739 Personal history of other diseases of the musculoskeletal system and connective tissue: Secondary | ICD-10-CM | POA: Insufficient documentation

## 2014-03-10 DIAGNOSIS — I509 Heart failure, unspecified: Secondary | ICD-10-CM | POA: Insufficient documentation

## 2014-03-10 DIAGNOSIS — Z8709 Personal history of other diseases of the respiratory system: Secondary | ICD-10-CM | POA: Insufficient documentation

## 2014-03-10 DIAGNOSIS — R143 Flatulence: Secondary | ICD-10-CM

## 2014-03-10 DIAGNOSIS — R0609 Other forms of dyspnea: Secondary | ICD-10-CM | POA: Insufficient documentation

## 2014-03-10 DIAGNOSIS — Z872 Personal history of diseases of the skin and subcutaneous tissue: Secondary | ICD-10-CM | POA: Insufficient documentation

## 2014-03-10 DIAGNOSIS — E119 Type 2 diabetes mellitus without complications: Secondary | ICD-10-CM | POA: Insufficient documentation

## 2014-03-10 DIAGNOSIS — R609 Edema, unspecified: Secondary | ICD-10-CM | POA: Insufficient documentation

## 2014-03-10 DIAGNOSIS — I2510 Atherosclerotic heart disease of native coronary artery without angina pectoris: Secondary | ICD-10-CM | POA: Insufficient documentation

## 2014-03-10 DIAGNOSIS — R109 Unspecified abdominal pain: Secondary | ICD-10-CM | POA: Insufficient documentation

## 2014-03-10 DIAGNOSIS — R601 Generalized edema: Secondary | ICD-10-CM

## 2014-03-10 DIAGNOSIS — Z853 Personal history of malignant neoplasm of breast: Secondary | ICD-10-CM | POA: Insufficient documentation

## 2014-03-10 DIAGNOSIS — F411 Generalized anxiety disorder: Secondary | ICD-10-CM | POA: Insufficient documentation

## 2014-03-10 DIAGNOSIS — K219 Gastro-esophageal reflux disease without esophagitis: Secondary | ICD-10-CM | POA: Insufficient documentation

## 2014-03-10 LAB — CBC WITH DIFFERENTIAL/PLATELET
Basophils Absolute: 0 10*3/uL (ref 0.0–0.1)
Basophils Relative: 0 % (ref 0–1)
Eosinophils Absolute: 0.2 10*3/uL (ref 0.0–0.7)
Eosinophils Relative: 3 % (ref 0–5)
HCT: 34.3 % — ABNORMAL LOW (ref 36.0–46.0)
Hemoglobin: 11.6 g/dL — ABNORMAL LOW (ref 12.0–15.0)
Lymphocytes Relative: 26 % (ref 12–46)
Lymphs Abs: 1.7 10*3/uL (ref 0.7–4.0)
MCH: 30.5 pg (ref 26.0–34.0)
MCHC: 33.8 g/dL (ref 30.0–36.0)
MCV: 90.3 fL (ref 78.0–100.0)
Monocytes Absolute: 0.4 10*3/uL (ref 0.1–1.0)
Monocytes Relative: 6 % (ref 3–12)
Neutro Abs: 4.2 10*3/uL (ref 1.7–7.7)
Neutrophils Relative %: 65 % (ref 43–77)
Platelets: 147 10*3/uL — ABNORMAL LOW (ref 150–400)
RBC: 3.8 MIL/uL — ABNORMAL LOW (ref 3.87–5.11)
RDW: 13.2 % (ref 11.5–15.5)
WBC: 6.5 10*3/uL (ref 4.0–10.5)

## 2014-03-10 LAB — URINALYSIS, ROUTINE W REFLEX MICROSCOPIC
Bilirubin Urine: NEGATIVE
Glucose, UA: NEGATIVE mg/dL
Ketones, ur: NEGATIVE mg/dL
Leukocytes, UA: NEGATIVE
Nitrite: NEGATIVE
Protein, ur: >300 mg/dL — AB
Specific Gravity, Urine: >1.03 — ABNORMAL HIGH (ref 1.005–1.030)
Urobilinogen, UA: 0.2 mg/dL (ref 0.0–1.0)
pH: 5.5 (ref 5.0–8.0)

## 2014-03-10 LAB — URINE MICROSCOPIC-ADD ON

## 2014-03-10 LAB — COMPREHENSIVE METABOLIC PANEL
ALT: 12 U/L (ref 0–35)
AST: 15 U/L (ref 0–37)
Albumin: 3.1 g/dL — ABNORMAL LOW (ref 3.5–5.2)
Alkaline Phosphatase: 77 U/L (ref 39–117)
BUN: 21 mg/dL (ref 6–23)
CO2: 29 mEq/L (ref 19–32)
Calcium: 9.3 mg/dL (ref 8.4–10.5)
Chloride: 98 mEq/L (ref 96–112)
Creatinine, Ser: 1.84 mg/dL — ABNORMAL HIGH (ref 0.50–1.10)
GFR calc Af Amer: 27 mL/min — ABNORMAL LOW (ref >90)
GFR calc non Af Amer: 24 mL/min — ABNORMAL LOW (ref >90)
Glucose, Bld: 174 mg/dL — ABNORMAL HIGH (ref 70–99)
Potassium: 4.2 mEq/L (ref 3.7–5.3)
Sodium: 137 mEq/L (ref 137–147)
Total Bilirubin: 0.7 mg/dL (ref 0.3–1.2)
Total Protein: 7.1 g/dL (ref 6.0–8.3)

## 2014-03-10 MED ORDER — FUROSEMIDE 20 MG PO TABS: 20.0000 mg | ORAL_TABLET | Freq: Two times a day (BID) | ORAL | Status: DC

## 2014-03-10 MED ORDER — FUROSEMIDE 10 MG/ML IJ SOLN
20.0000 mg | Freq: Once | INTRAMUSCULAR | Status: AC
  Administered 2014-03-10: 20 mg via INTRAVENOUS
  Filled 2014-03-10: qty 2

## 2014-03-10 MED ORDER — LORAZEPAM 1 MG PO TABS: 1.0000 mg | ORAL_TABLET | Freq: Three times a day (TID) | ORAL | Status: DC | PRN

## 2014-03-10 MED ORDER — POTASSIUM CHLORIDE CRYS ER 20 MEQ PO TBCR: 20.0000 meq | EXTENDED_RELEASE_TABLET | Freq: Two times a day (BID) | ORAL | Status: DC

## 2014-03-10 NOTE — ED Notes (Signed)
Family states pt was here Thursday for being SOB. States she was told to use here inhaler and to take mucinex DM. Pt states she is not any better.

## 2014-03-10 NOTE — ED Provider Notes (Signed)
CSN: [id number]     Arrival date & time 03/10/14  1030 History  This chart was scribed for Virgel Manifold, MD by Roxan Diesel, ED scribe.  This patient was seen in room APA07/APA07 and the patient's care was started at 11:47 AM.   Chief Complaint  Patient presents with  . Shortness of Breath    The history is provided by the patient and a relative. No language interpreter was used.    HPI Comments: [name] is a 80 y.o. female who presents to the Emergency Department complaining of SOB that began over a week ago and worsened since yesterday.  Pt was seen here 2 days ago for one week of persistent worsening cough with associated SOB, fatigue and CP on coughing.  Extensive workup including CT-scan ruled out pneumonia and she was diagnosed with bronchitis and sent home with Mucinex DM and advised to use her inhaler.  Family states she has not improved at all since then and she has had worsening shortness of breath and wheezing since yesterday.  SOB is worsened by sitting up and wheezing is worse at night.  Family also notes that for the past 2 weeks her abdomen has seemed swollen and "tight" and this seems to be worsening over the past several days.  Pt also complains of constant pain radiating from her upper left arm into her chest.  She states this pain was also present at her last ED visit.  Family denies fever.     Past Medical History  Diagnosis Date  . Diabetes mellitus   . Hypertension   . Hyperlipidemia   . GERD (gastroesophageal reflux disease)   . Arthritis   . Anxiety   . Psoriasis   . CHF (congestive heart failure)   . History of detached retina repair     Bilateral  . Complication of anesthesia     History of cardiac arrest after being in admitted to room, pain medication does not agree with patient  . PONV (postoperative nausea and vomiting)   . Myocardial infarction 1980's  . TIA (transient ischemic attack)   . History of blood transfusion     d/t surgery  .  Peripheral vascular disease   . Nodule of chest wall 2010    benign mass chest  . CAD (coronary artery disease)   . Depression   . Shortness of breath     WITH EXERT  . Sleep apnea     Does not use CPAP    . History of UTI   . Stroke     MINI STROKES 7-8 YRS AGO  . Headache(784.0)   . Cancer     Left Breast  . No pertinent past medical history     Past Surgical History  Procedure Laterality Date  . Femoral-peroneal bpg   2006    Right leg  . 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 Abdominal hysterectomy    . Appendectomy    . Eye surgery      Bilateral cataract  . Tonsillectomy and adenoidectomy    . Umbilical hernia repair    . Finger amputation  1997    Left Third digit- distal end  . Above knee leg amputation  06/29/11    Right AKA by Dr. Donnetta Hutching  . Breast surgery  1995    Left Breast mastectomy  . No past surgeries      ESOPH  PROCEDURE  . Anterior chamber washout  08/12/2012    Procedure: ANTERIOR CHAMBER WASHOUT;  Surgeon: Hurman Horn, MD;  Location: [facility];  Service: Ophthalmology;  Laterality: Right;  Vitreous Washout    No family history on file.   History  Substance Use Topics  . Smoking status: Never Smoker   . Smokeless tobacco: Not on file  . Alcohol Use: No    OB History   Grav Para Term Preterm Abortions TAB SAB Ect Mult Living                   Review of Systems  All other systems reviewed and are negative.      Allergies  Aspirin; Atorvastatin; Codeine; Demerol; Fentanyl; Morphine and related; Nitrofurantoin; Penicillins; Rosuvastatin; and Sulfonamide derivatives  Home Medications   Current Outpatient Rx  Name  Route  Sig  Dispense  Refill  . acetaminophen (TYLENOL) 500 MG tablet   Oral   Take 500 mg by mouth daily as needed for mild pain or moderate pain.         Marland Kitchen albuterol (PROAIR HFA) 108 (90 BASE) MCG/ACT inhaler   Inhalation   Inhale 2 puffs into the lungs every 6 (six) hours as needed for wheezing or  shortness of breath.         Marland Kitchen amLODipine (NORVASC) 10 MG tablet   Oral   Take 10 mg by mouth every morning.          Marland Kitchen dextromethorphan-guaiFENesin (MUCINEX DM) 30-600 MG per 12 hr tablet   Oral   Take 1 tablet by mouth 2 (two) times daily.   14 tablet   1   . docusate sodium (COLACE) 100 MG capsule   Oral   Take 100 mg by mouth as needed. For constipation.         . dorzolamide-timolol (COSOPT) 22.3-6.8 MG/ML ophthalmic solution   Both Eyes   Place 1 drop into both eyes 2 (two) times daily.         . furosemide (LASIX) 20 MG tablet   Oral   Take 20 mg by mouth daily.           . insulin glargine (LANTUS) 100 UNIT/ML injection   Subcutaneous   Inject 65 Units into the skin every morning.          . insulin lispro (HUMALOG) 100 UNIT/ML injection   Subcutaneous   Inject 5-20 Units into the skin 2 (two) times daily with a meal. Sliding scale         . LORazepam (ATIVAN) 0.5 MG tablet   Oral   Take 0.5 mg by mouth every 4 (four) hours as needed for anxiety.         . Multiple Vitamins-Minerals (CENTRUM SILVER ADULT 50+ PO)   Oral   Take 1 tablet by mouth daily.         Marland Kitchen nystatin cream (MYCOSTATIN)   Topical   Apply 1 application topically daily. *Applied under breast         . pantoprazole (PROTONIX) 40 MG tablet   Oral   Take 40 mg by mouth every other day.          . allopurinol (ZYLOPRIM) 300 MG tablet   Oral   Take 300 mg by mouth every other day.          BP 113/54  Temp(Src) 98.4 F (36.9 C)  Resp 22  SpO2 91%  Physical Exam  Nursing note and vitals reviewed. Constitutional: She appears well-developed and  well-nourished. No distress.  HENT:  Head: Normocephalic and atraumatic.  Eyes: Right eye exhibits no discharge. Left eye exhibits no discharge.  Neck: Neck supple.  Cardiovascular: Normal rate, regular rhythm and normal heart sounds.  Exam reveals no gallop and no friction rub.   No murmur heard. Pulmonary/Chest: Effort  normal. No respiratory distress. She has no wheezes. She has no rales.  Abdominal: Soft. She exhibits distension. There is no tenderness.  Firm and distended w/o tenderness  Musculoskeletal: She exhibits no edema and no tenderness.  Bilateral AKAs  Neurological: She is alert.  Skin: Skin is warm and dry.  Psychiatric: She has a normal mood and affect. Her behavior is normal. Thought content normal.    ED Course  Procedures (including critical care time)  DIAGNOSTIC STUDIES: Oxygen Saturation is 91% on Startex, low by my interpretation.    COORDINATION OF CARE: 11:59 AM-Discussed treatment plan which includes EKG, CXR, CT abdomen, and labs with pt at bedside and pt agreed to plan.    Labs Review Labs Reviewed  COMPREHENSIVE METABOLIC PANEL - Abnormal; Notable for the following:    Glucose, Bld 174 (*)    Creatinine, Ser 1.84 (*)    Albumin 3.1 (*)    GFR calc non Af Amer 24 (*)    GFR calc Af Amer 27 (*)    All other components within normal limits  CBC WITH DIFFERENTIAL - Abnormal; Notable for the following:    RBC 3.80 (*)    Hemoglobin 11.6 (*)    HCT 34.3 (*)    Platelets 147 (*)    All other components within normal limits  URINALYSIS, ROUTINE W REFLEX MICROSCOPIC - Abnormal; Notable for the following:    APPearance HAZY (*)    Specific Gravity, Urine >1.030 (*)    Hgb urine dipstick SMALL (*)    Protein, ur >300 (*)    All other components within normal limits  URINE MICROSCOPIC-ADD ON - Abnormal; Notable for the following:    Squamous Epithelial / LPF MANY (*)    Bacteria, UA FEW (*)    Casts GRANULAR CAST (*)    All other components within normal limits     Imaging Review Ct Abdomen Pelvis Wo Contrast  03/10/2014   CLINICAL DATA:  Abdominal pain and distention. Elevated creatinine level.  EXAM: CT ABDOMEN AND PELVIS WITHOUT CONTRAST  TECHNIQUE: Multidetector CT imaging of the abdomen and pelvis was performed following the standard protocol without intravenous  contrast.  COMPARISON:  05/14/2010 and chest CT 03/08/2014 and chest CT 04/24/2010  FINDINGS: Stable pleural thickening along the right minor fissure. Patient has coronary atherosclerotic calcifications. Patchy densities at the lung bases may represent atelectasis. New trace right pleural effusion. No evidence for free air.  There are calcified gallstones. No acute abnormality to the liver, spleen, pancreas or adrenal glands. No evidence for kidney stones or hydronephrosis. Atherosclerotic calcifications in the aorta and visceral arteries without aneurysm. No significant free fluid or lymphadenopathy. However, there is a stable soft tissue structure along the left pelvic sidewall that measures 1.9 x 2.0 cm on sequence 2, image 71. This has not changed since 02/20/2008 and likely benign. There is extensive subcutaneous edema, particularly along the anterior abdominal wall. Uterus has been removed. No gross abnormality to the adnexal tissue. Fluid in the urinary bladder. Normal appearance of the small and large bowel. No acute bone abnormality. Disc space in disease in L4-L5 and L5-S1.  IMPRESSION: Diffuse subcutaneous edema and development of a tiny  right pleural effusion.  No acute abnormality within the abdomen or pelvis.  Cholelithiasis.   Electronically Signed   By: Markus Daft M.D.   On: 03/10/2014 13:41   Dg Chest 2 View  03/10/2014   CLINICAL DATA:  Dyspnea.  EXAM: CHEST  2 VIEW  COMPARISON:  03/08/2014  FINDINGS: Stable soft tissue fullness in the right paratracheal region is related to the known mediastinal lesion. Heart size is stable. Again noted are patchy densities at the right lung base which could represent atelectasis or vascular congestion. Heart size is stable. Tiny effusion on the recent CT is not demonstrated on this examination.  IMPRESSION: Right basilar densities suggest atelectasis and cannot exclude vascular congestion.  Stable appearance of the heart and mediastinum.   Electronically  Signed   By: Markus Daft M.D.   On: 03/10/2014 13:53   Dg Chest 2 View  03/08/2014   CLINICAL DATA:  Shortness of breath.  EXAM: CHEST  2 VIEW  COMPARISON:  DG CHEST 1V PORT dated 03/17/2013  FINDINGS: Mediastinum is stable, its prominence most likely secondary to prominent great vessels, and is unchanged. Stable cardiomegaly. No pulmonary venous congestion. Poor inspiration with mild basilar atelectasis. No pleural effusion or pneumothorax. Degenerative changes thoracic spine and both shoulders.  IMPRESSION: 1. Stable cardiomegaly. 2. Poor inspiration with mild bibasilar atelectasis.   Electronically Signed   By: [place]   On: 03/08/2014 19:16   Ct Chest Wo Contrast  03/08/2014   CLINICAL DATA:  Shortness of breath.  History of breast cancer.  EXAM: CT CHEST WITHOUT CONTRAST  TECHNIQUE: Multidetector CT imaging of the chest was performed following the standard protocol without IV contrast.  COMPARISON:  DG CHEST 2 VIEW dated 03/08/2014; CT ANGIO CHEST W/CM &/OR WO/CM dated 04/24/2010  FINDINGS: Right paratracheal mediastinal 4.2 x 4.9 x 5.6 cm (transverse by AP by CC) mass is contiguous with anterolateral aspect of the trachea. Mass extends towards the carina and is relatively unchanged in size, morphology. 18 Hounsfield units, previously 19 Hounsfield units on contrast-enhanced examination with nonenhancing cystic lesion. The mass appears smoothly marginated, without bronchial wall invasion.  Heart is mildly enlarged, pericardium is nonsuspicious. Thoracic aorta is normal in course and caliber with moderate calcific atherosclerosis.  See small bilateral pleural effusions with dependent atelectasis. Trace bronchial wall thickening. No focal consolidation. No pneumothorax.  Thoracic esophagus is nonsuspicious. Multiple small gallstones without superimposed CT findings of acute cholecystitis. Left thyroid lobe appears enlarged with coarse calcification which be better characterized on thyroid sonography  clinically indicated. Severe bilateral degenerative changes-shoulders. Moderate to severe degenerative change of thoracic spine.  IMPRESSION: Stable 4.2 x 4.9 x 5.6 cm cystic mediastinal mass, considering chronicity this is likely benign.  Stable mild cardiomegaly, trace pleural effusions and dependent atelectasis. Minimal bronchial wall thickening could reflect bronchitis.   Electronically Signed   By: Elon Alas   On: 03/08/2014 20:32     EKG Interpretation   Date/Time:  Saturday March 10 2014 11:08:04 EDT Ventricular Rate:  75 PR Interval:  308 QRS Duration: 108 QT Interval:  380 QTC Calculation: 424 R Axis:   -39 Text Interpretation:  Sinus rhythm with 1st degree A-V block with frequent  Premature ventricular complexes Left axis deviation Incomplete left bundle  branch block Abnormal QRS-T angle, consider primary T wave abnormality  Abnormal ECG When compared with ECG of 08-Mar-2014 18:30, No significant  change was found ED PHYSICIAN INTERPRETATION AVAILABLE IN [facility]  Confirmed by TEST, Record ([id number])  on 03/12/2014 6:46:34 AM      MDM   Final diagnoses:  Anasarca  Dyspnea    88yF with dyspnea.  Suspect dyspnea related to abdominal tightness/anasarca. Lungs clear. No increased WOB. O2 sats ok. Will increase lasix for a couple days. Outpt FU.     I personally preformed the services scribed in my presence. The recorded information has been reviewed is accurate. Virgel Manifold, MD.    Virgel Manifold, MD 03/15/14 [phone]

## 2014-03-10 NOTE — Discharge Instructions (Signed)
Edema Edema is an abnormal build-up of fluids in tissues. Because this is partly dependent on gravity (water flows to the lowest place), it is more common in the legs and thighs (lower extremities). It is also common in the looser tissues, like around the eyes. Painless swelling of the feet and ankles is common and increases as a person ages. It may affect both legs and may include the calves or even thighs. When squeezed, the fluid may move out of the affected area and may leave a dent for a few moments. CAUSES   Prolonged standing or sitting in one place for extended periods of time. Movement helps pump tissue fluid into the veins, and absence of movement prevents this, resulting in edema.  Varicose veins. The valves in the veins do not work as well as they should. This causes fluid to leak into the tissues.  Fluid and salt overload.  Injury, burn, or surgery to the leg, ankle, or foot, may damage veins and allow fluid to leak out.  Sunburn damages vessels. Leaky vessels allow fluid to go out into the sunburned tissues.  Allergies (from insect bites or stings, medications or chemicals) cause swelling by allowing vessels to become leaky.  Protein in the blood helps keep fluid in your vessels. Low protein, as in malnutrition, allows fluid to leak out.  Hormonal changes, including pregnancy and menstruation, cause fluid retention. This fluid may leak out of vessels and cause edema.  Medications that cause fluid retention. Examples are sex hormones, blood pressure medications, steroid treatment, or anti-depressants.  Some illnesses cause edema, especially heart failure, kidney disease, or liver disease.  Surgery that cuts veins or lymph nodes, such as surgery done for the heart or for breast cancer, may result in edema. DIAGNOSIS  Your caregiver is usually easily able to determine what is causing your swelling (edema) by simply asking what is wrong (getting a history) and examining you (doing  a physical). Sometimes x-rays, EKG (electrocardiogram or heart tracing), and blood work may be done to evaluate for underlying medical illness. TREATMENT  General treatment includes:  Leg elevation (or elevation of the affected body part).  Restriction of fluid intake.  Prevention of fluid overload.  Compression of the affected body part. Compression with elastic bandages or support stockings squeezes the tissues, preventing fluid from entering and forcing it back into the blood vessels.  Diuretics (also called water pills or fluid pills) pull fluid out of your body in the form of increased urination. These are effective in reducing the swelling, but can have side effects and must be used only under your caregiver's supervision. Diuretics are appropriate only for some types of edema. The specific treatment can be directed at any underlying causes discovered. Heart, liver, or kidney disease should be treated appropriately. HOME CARE INSTRUCTIONS   Elevate the legs (or affected body part) above the level of the heart, while lying down.  Avoid sitting or standing still for prolonged periods of time.  Avoid putting anything directly under the knees when lying down, and do not wear constricting clothing or garters on the upper legs.  Exercising the legs causes the fluid to work back into the veins and lymphatic channels. This may help the swelling go down.  The pressure applied by elastic bandages or support stockings can help reduce ankle swelling.  A low-salt diet may help reduce fluid retention and decrease the ankle swelling.  Take any medications exactly as prescribed. SEEK MEDICAL CARE IF:  Your edema is   not responding to recommended treatments. SEEK IMMEDIATE MEDICAL CARE IF:   You develop shortness of breath or chest pain.  You cannot breathe when you lay down; or if, while lying down, you have to get up and go to the window to get your breath.  You are having increasing  swelling without relief from treatment.  You develop a fever over 102 F (38.9 C).  You develop pain or redness in the areas that are swollen.  Tell your caregiver right away if you have gained 03 lb/1.4 kg in 1 day or 05 lb/2.3 kg in a week. MAKE SURE YOU:   Understand these instructions.  Will watch your condition.  Will get help right away if you are not doing well or get worse. Document Released: 11/30/2005 Document Revised: 05/31/2012 Document Reviewed: 07/18/2008 [employer] Patient Information 2014 [employer].  

## 2014-04-06 DIAGNOSIS — G4733 Obstructive sleep apnea (adult) (pediatric): Secondary | ICD-10-CM | POA: Insufficient documentation

## 2014-04-06 DIAGNOSIS — K219 Gastro-esophageal reflux disease without esophagitis: Secondary | ICD-10-CM | POA: Insufficient documentation

## 2014-05-24 ENCOUNTER — Ambulatory Visit (INDEPENDENT_AMBULATORY_CARE_PROVIDER_SITE_OTHER): Payer: Medicare Other | Admitting: Cardiology

## 2014-05-24 ENCOUNTER — Encounter: Payer: Self-pay | Admitting: Cardiology

## 2014-05-24 VITALS — BP 101/58 | HR 58 | Ht 64.0 in | Wt 230.0 lb

## 2014-05-24 DIAGNOSIS — I2510 Atherosclerotic heart disease of native coronary artery without angina pectoris: Secondary | ICD-10-CM

## 2014-05-24 DIAGNOSIS — I10 Essential (primary) hypertension: Secondary | ICD-10-CM

## 2014-05-24 NOTE — Progress Notes (Signed)
Clinical Summary [name] is a 80 y.o.female last seen by Dr Verl Blalock, this is our first visit together. She is seen for the following medical problems.   1. CAD - hx of prior MI, several reviewed notes regarding this history are not descriptive. She denies any history of stenting or bypassing, reports heart cath back in the 80s.  - echo 2010 LVEF 55-60%t - denies any chest pain. No orthopnea, no PND. No significant SOB - not on ASA, told they make her eyes hemorrhage. Reported allergy to statin, myalgias.   2. PAD - multiple lower extremity interventions, as well as bilateral amputation  3. Hyperlipidemia - followed by pcp  4. HTN She is not on ACE-I, I presume due to her renal dysfunction.  - compliant with other meds  5. Chronic diastolic dyfunction - 07/5884 echo grade II diasotlic dysfunction - fairly sedentary lifestyle, denies significant DOE   Past Medical History  Diagnosis Date  . Diabetes mellitus   . Hypertension   . Hyperlipidemia   . GERD (gastroesophageal reflux disease)   . Arthritis   . Anxiety   . Psoriasis   . CHF (congestive heart failure)   . History of detached retina repair     Bilateral  . Complication of anesthesia     History of cardiac arrest after being in admitted to room, pain medication does not agree with patient  . PONV (postoperative nausea and vomiting)   . Myocardial infarction 1980's  . TIA (transient ischemic attack)   . History of blood transfusion     d/t surgery  . Peripheral vascular disease   . Nodule of chest wall 2010    benign mass chest  . CAD (coronary artery disease)   . Depression   . Shortness of breath     WITH EXERT  . Sleep apnea     Does not use CPAP    . History of UTI   . Stroke     MINI STROKES 7-8 YRS AGO  . Headache(784.0)   . Cancer     Left Breast  . No pertinent past medical history      Allergies  Allergen Reactions  . Aspirin   . Atorvastatin   . Codeine     Hallucinations/Passes Out    . Demerol [Meperidine]     Hallucinations/Passes Out   . Fentanyl     Hallucinations/Passes Out   . Morphine And Related     Hallucinations/Passes Out   . Nitrofurantoin     REACTION: Trouble breathing, and swallowing.  Marland Kitchen Penicillins   . Rosuvastatin   . Sulfonamide Derivatives      Current Outpatient Prescriptions  Medication Sig Dispense Refill  . acetaminophen (TYLENOL) 500 MG tablet Take 500 mg by mouth daily as needed for mild pain or moderate pain.      Marland Kitchen albuterol (PROAIR HFA) 108 (90 BASE) MCG/ACT inhaler Inhale 2 puffs into the lungs every 6 (six) hours as needed for wheezing or shortness of breath.      . allopurinol (ZYLOPRIM) 300 MG tablet Take 300 mg by mouth every other day.      Marland Kitchen amLODipine (NORVASC) 10 MG tablet Take 10 mg by mouth every morning.       Marland Kitchen dextromethorphan-guaiFENesin (MUCINEX DM) 30-600 MG per 12 hr tablet Take 1 tablet by mouth 2 (two) times daily.  14 tablet  1  . docusate sodium (COLACE) 100 MG capsule Take 100 mg by mouth as needed.  For constipation.      . dorzolamide-timolol (COSOPT) 22.3-6.8 MG/ML ophthalmic solution Place 1 drop into both eyes 2 (two) times daily.      . furosemide (LASIX) 20 MG tablet Take 20 mg by mouth daily.        . furosemide (LASIX) 20 MG tablet Take 1 tablet (20 mg total) by mouth 2 (two) times daily.  4 tablet  0  . insulin glargine (LANTUS) 100 UNIT/ML injection Inject 65 Units into the skin every morning.       . insulin lispro (HUMALOG) 100 UNIT/ML injection Inject 5-20 Units into the skin 2 (two) times daily with a meal. Sliding scale      . LORazepam (ATIVAN) 0.5 MG tablet Take 0.5 mg by mouth every 4 (four) hours as needed for anxiety.      Marland Kitchen LORazepam (ATIVAN) 1 MG tablet Take 1 tablet (1 mg total) by mouth 3 (three) times daily as needed for anxiety.  10 tablet  0  . Multiple Vitamins-Minerals (CENTRUM SILVER ADULT 50+ PO) Take 1 tablet by mouth daily.      Marland Kitchen nystatin cream (MYCOSTATIN) Apply 1 application  topically daily. *Applied under breast      . pantoprazole (PROTONIX) 40 MG tablet Take 40 mg by mouth every other day.       . potassium chloride SA (K-DUR,KLOR-CON) 20 MEQ tablet Take 1 tablet (20 mEq total) by mouth 2 (two) times daily.  5 tablet  0   No current facility-administered medications for this visit.     Past Surgical History  Procedure Laterality Date  . Femoral-peroneal bpg   2006    Right leg  . 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 Abdominal hysterectomy    . Appendectomy    . Eye surgery      Bilateral cataract  . Tonsillectomy and adenoidectomy    . Umbilical hernia repair    . Finger amputation  1997    Left Third digit- distal end  . Above knee leg amputation  06/29/11    Right AKA by Dr. Donnetta Hutching  . Breast surgery  1995    Left Breast mastectomy  . No past surgeries      ESOPH  PROCEDURE  . Anterior chamber washout  08/12/2012    Procedure: ANTERIOR CHAMBER WASHOUT;  Surgeon: Hurman Horn, MD;  Location: [facility];  Service: Ophthalmology;  Laterality: Right;  Vitreous Washout     Allergies  Allergen Reactions  . Aspirin   . Atorvastatin   . Codeine     Hallucinations/Passes Out   . Demerol [Meperidine]     Hallucinations/Passes Out   . Fentanyl     Hallucinations/Passes Out   . Morphine And Related     Hallucinations/Passes Out   . Nitrofurantoin     REACTION: Trouble breathing, and swallowing.  Marland Kitchen Penicillins   . Rosuvastatin   . Sulfonamide Derivatives       No family history on file.   Social History Ms. Gregory reports that she has never smoked. She does not have any smokeless tobacco history on file. Ms. Galeno reports that she does not drink alcohol.   Review of Systems CONSTITUTIONAL: No weight loss, fever, chills, weakness or fatigue.  HEENT: Eyes: No visual loss, blurred vision, double vision or yellow sclerae.No hearing loss, sneezing, congestion, runny nose or sore throat.  SKIN: No rash or itching.  CARDIOVASCULAR:  per HPI RESPIRATORY: No shortness of breath,  cough or sputum.  GASTROINTESTINAL: No anorexia, nausea, vomiting or diarrhea. No abdominal pain or blood.  GENITOURINARY: No burning on urination, no polyuria NEUROLOGICAL: No headache, dizziness, syncope, paralysis, ataxia, numbness or tingling in the extremities. No change in bowel or bladder control.  MUSCULOSKELETAL: No muscle, back pain, joint pain or stiffness.  LYMPHATICS: No enlarged nodes. No history of splenectomy.  PSYCHIATRIC: No history of depression or anxiety.  ENDOCRINOLOGIC: No reports of sweating, cold or heat intolerance. No polyuria or polydipsia.  Marland Kitchen   Physical Examination p 58 bp 101/58 Wt 230 lbs BMI 40 Gen: resting comfortably, no acute distress HEENT: no scleral icterus, pupils equal round and reactive, no palptable cervical adenopathy,  CV: RRR, no m/r/g, no JVD, no carotid bruits Resp: Clear to auscultation bilaterally GI: abdomen is soft, non-tender, non-distended, normal bowel sounds, no hepatosplenomegaly MSK: extremities are warm, no edema.  Skin: warm, no rash Neuro:  no focal deficits Psych: appropriate affect   Diagnostic Studies 11/2009 Echo Study Conclusions  - Left ventricle: The cavity size was normal. There was moderate concentric hypertrophy. Systolic function was normal. The estimated ejection fraction was in the range of 55% to 60%. - Left atrium: The atrium was moderately dilated. - Pulmonary arteries: PA peak pressure: 34mm Hg (S).  03/2014 Echo SUMMARY The left ventricular size is normal.  Left ventricular systolic function is normal.  Left ventricular filling pattern is pseudonormal. The right ventricle is normal in size and function. The left atrium is mildly dilated. There is aortic valve sclerosis. There is mild mitral annular calcification. There is mild mitral regurgitation. The mitral regurgitant jet is eccentrically directed. No significant stenosis seen Mild pulmonary  hypertension. There is no pericardial effusion. There is no comparison study available. - FINDINGS:  LEFT VENTRICLE The left ventricular size is normal. There is normal left ventricular wall  thickness. Left ventricular systolic function is normal. LV ejection fraction  = 55-60%. Left ventricular filling pattern is pseudonormal. No segmental wall  motion abnormalities seen in the left ventricle.     Assessment and Plan  1. CAD - no current symptoms, continue risk factor modification - has not been on ASA, from her report she had some form of eye hemorrhage and was told not to take again  2. PAD - continue current medical therapy  3. Hyperlipidemia - followed by pcp, reported allergy to statins  4. HTN - bp at goal, continue current meds  5. Chronic diastolic dysfunction - no current symptoms, though fairly sedentary lifestyle. Apears euvolemic. Continue current diuretic and bp control  F/u  1 year     Arnoldo Lenis, M.D., F.A.C.C.

## 2014-05-24 NOTE — Patient Instructions (Signed)
Your physician wants you to follow-up in: 1 year with DrBranch You will receive a reminder letter in the mail two months in advance. If you don't receive a letter, please call our office to schedule the follow-up appointment.     Your physician recommends that you continue on your current medications as directed. Please refer to the Current Medication list given to you today.      Thank you for choosing [facility] !        

## 2014-09-12 ENCOUNTER — Emergency Department (HOSPITAL_COMMUNITY)
Admission: EM | Admit: 2014-09-12 | Discharge: 2014-09-12 | Disposition: A | Discharge status: Home / Self Care | Payer: Medicare Other | Attending: Emergency Medicine | Admitting: Emergency Medicine

## 2014-09-12 ENCOUNTER — Emergency Department (HOSPITAL_COMMUNITY): Payer: Medicare Other

## 2014-09-12 ENCOUNTER — Encounter (HOSPITAL_COMMUNITY): Payer: Self-pay | Admitting: Emergency Medicine

## 2014-09-12 DIAGNOSIS — Z79899 Other long term (current) drug therapy: Secondary | ICD-10-CM | POA: Diagnosis not present

## 2014-09-12 DIAGNOSIS — I509 Heart failure, unspecified: Secondary | ICD-10-CM | POA: Insufficient documentation

## 2014-09-12 DIAGNOSIS — I2510 Atherosclerotic heart disease of native coronary artery without angina pectoris: Secondary | ICD-10-CM | POA: Insufficient documentation

## 2014-09-12 DIAGNOSIS — J069 Acute upper respiratory infection, unspecified: Secondary | ICD-10-CM | POA: Diagnosis not present

## 2014-09-12 DIAGNOSIS — I252 Old myocardial infarction: Secondary | ICD-10-CM | POA: Diagnosis not present

## 2014-09-12 DIAGNOSIS — I10 Essential (primary) hypertension: Secondary | ICD-10-CM | POA: Diagnosis not present

## 2014-09-12 DIAGNOSIS — R0602 Shortness of breath: Secondary | ICD-10-CM | POA: Diagnosis present

## 2014-09-12 DIAGNOSIS — Z87440 Personal history of urinary (tract) infections: Secondary | ICD-10-CM | POA: Diagnosis not present

## 2014-09-12 DIAGNOSIS — K219 Gastro-esophageal reflux disease without esophagitis: Secondary | ICD-10-CM | POA: Insufficient documentation

## 2014-09-12 DIAGNOSIS — Z853 Personal history of malignant neoplasm of breast: Secondary | ICD-10-CM | POA: Insufficient documentation

## 2014-09-12 DIAGNOSIS — F329 Major depressive disorder, single episode, unspecified: Secondary | ICD-10-CM | POA: Diagnosis not present

## 2014-09-12 DIAGNOSIS — F3289 Other specified depressive episodes: Secondary | ICD-10-CM | POA: Insufficient documentation

## 2014-09-12 DIAGNOSIS — Z880 Allergy status to penicillin: Secondary | ICD-10-CM | POA: Diagnosis not present

## 2014-09-12 DIAGNOSIS — K5900 Constipation, unspecified: Secondary | ICD-10-CM | POA: Diagnosis not present

## 2014-09-12 DIAGNOSIS — Z794 Long term (current) use of insulin: Secondary | ICD-10-CM | POA: Insufficient documentation

## 2014-09-12 DIAGNOSIS — M129 Arthropathy, unspecified: Secondary | ICD-10-CM | POA: Insufficient documentation

## 2014-09-12 DIAGNOSIS — Z8673 Personal history of transient ischemic attack (TIA), and cerebral infarction without residual deficits: Secondary | ICD-10-CM | POA: Diagnosis not present

## 2014-09-12 DIAGNOSIS — E119 Type 2 diabetes mellitus without complications: Secondary | ICD-10-CM | POA: Insufficient documentation

## 2014-09-12 LAB — PRO B NATRIURETIC PEPTIDE: Pro B Natriuretic peptide (BNP): 787.1 pg/mL — ABNORMAL HIGH (ref 0–450)

## 2014-09-12 LAB — CBC WITH DIFFERENTIAL/PLATELET
Basophils Absolute: 0 10*3/uL (ref 0.0–0.1)
Basophils Relative: 0 % (ref 0–1)
Eosinophils Absolute: 0.2 10*3/uL (ref 0.0–0.7)
Eosinophils Relative: 4 % (ref 0–5)
HCT: 35.1 % — ABNORMAL LOW (ref 36.0–46.0)
HEMOGLOBIN: 12.1 g/dL (ref 12.0–15.0)
LYMPHS ABS: 2.1 10*3/uL (ref 0.7–4.0)
Lymphocytes Relative: 33 % (ref 12–46)
MCH: 30.9 pg (ref 26.0–34.0)
MCHC: 34.5 g/dL (ref 30.0–36.0)
MCV: 89.5 fL (ref 78.0–100.0)
MONOS PCT: 6 % (ref 3–12)
Monocytes Absolute: 0.4 10*3/uL (ref 0.1–1.0)
NEUTROS ABS: 3.8 10*3/uL (ref 1.7–7.7)
Neutrophils Relative %: 57 % (ref 43–77)
Platelets: 162 10*3/uL (ref 150–400)
RBC: 3.92 MIL/uL (ref 3.87–5.11)
RDW: 13.2 % (ref 11.5–15.5)
WBC: 6.6 10*3/uL (ref 4.0–10.5)

## 2014-09-12 LAB — COMPREHENSIVE METABOLIC PANEL
ALT: 15 U/L (ref 0–35)
AST: 16 U/L (ref 0–37)
Albumin: 3.3 g/dL — ABNORMAL LOW (ref 3.5–5.2)
Alkaline Phosphatase: 66 U/L (ref 39–117)
Anion gap: 10 (ref 5–15)
BUN: 22 mg/dL (ref 6–23)
CHLORIDE: 100 meq/L (ref 96–112)
CO2: 28 mEq/L (ref 19–32)
CREATININE: 1.78 mg/dL — AB (ref 0.50–1.10)
Calcium: 9.5 mg/dL (ref 8.4–10.5)
GFR calc Af Amer: 29 mL/min — ABNORMAL LOW (ref >90)
GFR calc non Af Amer: 25 mL/min — ABNORMAL LOW (ref >90)
GLUCOSE: 185 mg/dL — AB (ref 70–99)
Potassium: 3.8 mEq/L (ref 3.7–5.3)
Sodium: 138 mEq/L (ref 137–147)
Total Bilirubin: 0.4 mg/dL (ref 0.3–1.2)
Total Protein: 7.1 g/dL (ref 6.0–8.3)

## 2014-09-12 LAB — TROPONIN I: Troponin I: <0.3 ng/mL (ref <0.30)

## 2014-09-12 MED ORDER — IPRATROPIUM-ALBUTEROL 0.5-2.5 (3) MG/3ML IN SOLN
3.0000 mL | Freq: Once | RESPIRATORY_TRACT | Status: AC
  Administered 2014-09-12: 3 mL via RESPIRATORY_TRACT
  Filled 2014-09-12: qty 3

## 2014-09-12 MED ORDER — FLEET ENEMA 7-19 GM/118ML RE ENEM: 1.0000 | ENEMA | Freq: Once | RECTAL | Status: DC

## 2014-09-12 MED ORDER — ALBUTEROL SULFATE HFA 108 (90 BASE) MCG/ACT IN AERS: 2.0000 | INHALATION_SPRAY | RESPIRATORY_TRACT | Status: AC | PRN

## 2014-09-12 NOTE — Discharge Instructions (Signed)
Use over-the-counter MiraLAX once daily, Colace twice daily and Fleet enema as twice daily as needed for constipation. Please drink plenty of water and eat foods high in fiber.   Constipation Constipation is when a person has fewer than three bowel movements a week, has difficulty having a bowel movement, or has stools that are dry, hard, or larger than normal. As people grow older, constipation is more common. If you try to fix constipation with medicines that make you have a bowel movement (laxatives), the problem may get worse. Long-term laxative use may cause the muscles of the colon to become weak. A low-fiber diet, not taking in enough fluids, and taking certain medicines may make constipation worse.  CAUSES   Certain medicines, such as antidepressants, pain medicine, iron supplements, antacids, and water pills.   Certain diseases, such as diabetes, irritable bowel syndrome (IBS), thyroid disease, or depression.   Not drinking enough water.   Not eating enough fiber-rich foods.   Stress or travel.   Lack of physical activity or exercise.   Ignoring the urge to have a bowel movement.   Using laxatives too much.  SIGNS AND SYMPTOMS   Having fewer than three bowel movements a week.   Straining to have a bowel movement.   Having stools that are hard, dry, or larger than normal.   Feeling full or bloated.   Pain in the lower abdomen.   Not feeling relief after having a bowel movement.  DIAGNOSIS  Your health care provider will take a medical history and perform a physical exam. Further testing may be done for severe constipation. Some tests may include:  A barium enema X-ray to examine your rectum, colon, and, sometimes, your small intestine.   A sigmoidoscopy to examine your lower colon.   A colonoscopy to examine your entire colon. TREATMENT  Treatment will depend on the severity of your constipation and what is causing it. Some dietary treatments  include drinking more fluids and eating more fiber-rich foods. Lifestyle treatments may include regular exercise. If these diet and lifestyle recommendations do not help, your health care provider may recommend taking over-the-counter laxative medicines to help you have bowel movements. Prescription medicines may be prescribed if over-the-counter medicines do not work.  HOME CARE INSTRUCTIONS   Eat foods that have a lot of fiber, such as fruits, vegetables, whole grains, and beans.  Limit foods high in fat and processed sugars, such as french fries, hamburgers, cookies, candies, and soda.   A fiber supplement may be added to your diet if you cannot get enough fiber from foods.   Drink enough fluids to keep your urine clear or pale yellow.   Exercise regularly or as directed by your health care provider.   Go to the restroom when you have the urge to go. Do not hold it.   Only take over-the-counter or prescription medicines as directed by your health care provider. Do not take other medicines for constipation without talking to your health care provider first.  [place] IF:   You have bright red blood in your stool.   Your constipation lasts for more than 4 days or gets worse.   You have abdominal or rectal pain.   You have thin, pencil-like stools.   You have unexplained weight loss. MAKE SURE YOU:   Understand these instructions.  Will watch your condition.  Will get help right away if you are not doing well or get worse. Document Released: 08/28/2004 Document Revised: 12/05/2013  Document Reviewed: 09/11/2013 [facility] Patient Information 2015 [place], [place]. This information is not intended to replace advice given to you by your health care provider. Make sure you discuss any questions you have with your health care provider.  Upper Respiratory Infection, Adult An upper respiratory infection (URI) is also sometimes known as the common cold. The  upper respiratory tract includes the nose, sinuses, throat, trachea, and bronchi. Bronchi are the airways leading to the lungs. Most people improve within 1 week, but symptoms can last up to 2 weeks. A residual cough may last even longer.  CAUSES Many different viruses can infect the tissues lining the upper respiratory tract. The tissues become irritated and inflamed and often become very moist. Mucus production is also common. A cold is contagious. You can easily spread the virus to others by oral contact. This includes kissing, sharing a glass, coughing, or sneezing. Touching your mouth or nose and then touching a surface, which is then touched by another person, can also spread the virus. SYMPTOMS  Symptoms typically develop 1 to 3 days after you come in contact with a cold virus. Symptoms vary from person to person. They may include:  Runny nose.  Sneezing.  Nasal congestion.  Sinus irritation.  Sore throat.  Loss of voice (laryngitis).  Cough.  Fatigue.  Muscle aches.  Loss of appetite.  Headache.  Low-grade fever. DIAGNOSIS  You might diagnose your own cold based on familiar symptoms, since most people get a cold 2 to 3 times a year. Your caregiver can confirm this based on your exam. Most importantly, your caregiver can check that your symptoms are not due to another disease such as strep throat, sinusitis, pneumonia, asthma, or epiglottitis. Blood tests, throat tests, and X-rays are not necessary to diagnose a common cold, but they may sometimes be helpful in excluding other more serious diseases. Your caregiver will decide if any further tests are required. RISKS AND COMPLICATIONS  You may be at risk for a more severe case of the common cold if you smoke cigarettes, have chronic heart disease (such as heart failure) or lung disease (such as asthma), or if you have a weakened immune system. The very young and very old are also at risk for more serious infections. Bacterial  sinusitis, middle ear infections, and bacterial pneumonia can complicate the common cold. The common cold can worsen asthma and chronic obstructive pulmonary disease (COPD). Sometimes, these complications can require emergency medical care and may be life-threatening. PREVENTION  The best way to protect against getting a cold is to practice good hygiene. Avoid oral or hand contact with people with cold symptoms. Wash your hands often if contact occurs. There is no clear evidence that vitamin C, vitamin E, echinacea, or exercise reduces the chance of developing a cold. However, it is always recommended to get plenty of rest and practice good nutrition. TREATMENT  Treatment is directed at relieving symptoms. There is no cure. Antibiotics are not effective, because the infection is caused by a virus, not by bacteria. Treatment may include:  Increased fluid intake. Sports drinks offer valuable electrolytes, sugars, and fluids.  Breathing heated mist or steam (vaporizer or shower).  Eating chicken soup or other clear broths, and maintaining good nutrition.  Getting plenty of rest.  Using gargles or lozenges for comfort.  Controlling fevers with ibuprofen or acetaminophen as directed by your caregiver.  Increasing usage of your inhaler if you have asthma. Zinc gel and zinc lozenges, taken in the first 24 hours  of the common cold, can shorten the duration and lessen the severity of symptoms. Pain medicines may help with fever, muscle aches, and throat pain. A variety of non-prescription medicines are available to treat congestion and runny nose. Your caregiver can make recommendations and may suggest nasal or lung inhalers for other symptoms.  HOME CARE INSTRUCTIONS   Only take over-the-counter or prescription medicines for pain, discomfort, or fever as directed by your caregiver.  Use a warm mist humidifier or inhale steam from a shower to increase air moisture. This may keep secretions moist and  make it easier to breathe.  Drink enough water and fluids to keep your urine clear or pale yellow.  Rest as needed.  Return to work when your temperature has returned to normal or as your caregiver advises. You may need to stay home longer to avoid infecting others. You can also use a face mask and careful hand washing to prevent spread of the virus. SEEK MEDICAL CARE IF:   After the first few days, you feel you are getting worse rather than better.  You need your caregiver's advice about medicines to control symptoms.  You develop chills, worsening shortness of breath, or brown or red sputum. These may be signs of pneumonia.  You develop yellow or brown nasal discharge or pain in the face, especially when you bend forward. These may be signs of sinusitis.  You develop a fever, swollen neck glands, pain with swallowing, or white areas in the back of your throat. These may be signs of strep throat. SEEK IMMEDIATE MEDICAL CARE IF:   You have a fever.  You develop severe or persistent headache, ear pain, sinus pain, or chest pain.  You develop wheezing, a prolonged cough, cough up blood, or have a change in your usual mucus (if you have chronic lung disease).  You develop sore muscles or a stiff neck. Document Released: 05/26/2001 Document Revised: 02/22/2012 Document Reviewed: 03/07/2014 [facility] Patient Information 2015 [place], [place]. This information is not intended to replace advice given to you by your health care provider. Make sure you discuss any questions you have with your health care provider.

## 2014-09-12 NOTE — ED Notes (Signed)
Patient via [facility] C/O shortness of breath that started yesterday. Patient states dry cough and upper abdominal pain. Patient states she has not had a bowel movement since Monday, and has chronic constipation. Patient is bilateral AKA, visual impaired, and hearing impaired. Patient is A&O to time, place, and situation.

## 2014-09-12 NOTE — ED Provider Notes (Signed)
This chart was scribed for [place], DO by Lowella Petties, ED Scribe. The patient was seen in room APA05/APA05. Patient's care was started at 8:26 PM.  CHIEF COMPLAINT: SOB  HPI:  [name] is a 80 y.o. female with a history of DM, HTN, HLD, CHF, MI, CAD, CVA who presents to the Emergency Department complaining of SOB onset yesterday but acutely worsened tonight. She reports chills and a persistent dry cough.  She has had central chest tightness without radiation. Pain is better with coughing. She reports constipation today and feels that her abdomen is swollen. Last bowel movement was Monday, 2 days ago. She reports she has been passing gas. No prior history of small bowel obstruction. She denies nausea, vomiting, or diarrhea. No bloody stool or melena. She does not wear O2 at home. No history of tobacco use, COPD or asthma.  PCP: Lanette Hampshire, MD  ROS: See HPI Constitutional: no fever  Eyes: no drainage  ENT: no runny nose   Cardiovascular:  chest pain  Resp: SOB  GI: no vomiting, abdominal pain GU: no dysuria Integumentary: no rash  Allergy: no hives  Musculoskeletal: no leg swelling  Neurological: no slurred speech ROS otherwise negative  PAST MEDICAL HISTORY/PAST SURGICAL HISTORY:  Past Medical History  Diagnosis Date  . Diabetes mellitus   . Hypertension   . Hyperlipidemia   . GERD (gastroesophageal reflux disease)   . Arthritis   . Anxiety   . Psoriasis   . CHF (congestive heart failure)   . History of detached retina repair     Bilateral  . Complication of anesthesia     History of cardiac arrest after being in admitted to room, pain medication does not agree with patient  . PONV (postoperative nausea and vomiting)   . Myocardial infarction 1980's  . TIA (transient ischemic attack)   . History of blood transfusion     d/t surgery  . Peripheral vascular disease   . Nodule of chest wall 2010    benign mass chest  . CAD (coronary artery disease)   .  Depression   . Shortness of breath     WITH EXERT  . Sleep apnea     Does not use CPAP    . History of UTI   . Stroke     MINI STROKES 7-8 YRS AGO  . Headache(784.0)   . Cancer     Left Breast  . No pertinent past medical history     MEDICATIONS:  Prior to Admission medications   Medication Sig Start Date End Date Taking? Authorizing Provider  acetaminophen (TYLENOL) 500 MG tablet Take 500 mg by mouth daily as needed for mild pain or moderate pain.    Historical Provider, MD  acetic acid (VOSOL) 2 % otic solution 5 drops.    Historical Provider, MD  albuterol (PROAIR HFA) 108 (90 BASE) MCG/ACT inhaler Inhale 2 puffs into the lungs every 6 (six) hours as needed for wheezing or shortness of breath.    Historical Provider, MD  allopurinol (ZYLOPRIM) 300 MG tablet Take 300 mg by mouth every other day.    Historical Provider, MD  amLODipine (NORVASC) 10 MG tablet Take 10 mg by mouth every morning.     Historical Provider, MD  dextromethorphan-guaiFENesin (MUCINEX DM) 30-600 MG per 12 hr tablet Take 1 tablet by mouth 2 (two) times daily. 03/08/14   Fredia Sorrow, MD  docusate sodium (COLACE) 100 MG capsule Take 100 mg by mouth as  needed. For constipation.    Historical Provider, MD  dorzolamide-timolol (COSOPT) 22.3-6.8 MG/ML ophthalmic solution Place 1 drop into both eyes 2 (two) times daily.    Historical Provider, MD  furosemide (LASIX) 20 MG tablet Take 20 mg by mouth daily.      Historical Provider, MD  insulin glargine (LANTUS) 100 UNIT/ML injection Inject 65 Units into the skin every morning.     Historical Provider, MD  insulin lispro (HUMALOG) 100 UNIT/ML injection Inject 5-20 Units into the skin 2 (two) times daily with a meal. Sliding scale    Historical Provider, MD  LORazepam (ATIVAN) 0.5 MG tablet Take 0.5 mg by mouth every 4 (four) hours as needed for anxiety.    Historical Provider, MD  Multiple Vitamins-Minerals (CENTRUM SILVER ADULT 50+ PO) Take 1 tablet by mouth daily.     Historical Provider, MD  nystatin cream (MYCOSTATIN) Apply 1 application topically daily. *Applied under breast    Historical Provider, MD  pantoprazole (PROTONIX) 40 MG tablet Take 40 mg by mouth every other day.     Historical Provider, MD    ALLERGIES:  Allergies  Allergen Reactions  . Tramadol Anaphylaxis    Caused cardiac arrest after leg amputation  . Aspirin   . Atorvastatin   . Codeine     Hallucinations/Passes Out   . Demerol [Meperidine]     Hallucinations/Passes Out   . Fentanyl     Hallucinations/Passes Out   . Morphine And Related     Hallucinations/Passes Out   . Nitrofurantoin     REACTION: Trouble breathing, and swallowing.  Marland Kitchen Penicillin G Nausea Only  . Penicillins   . Rosuvastatin   . Sulfamethoxazole Nausea Only  . Sulfonamide Derivatives   . Tobramycin-Dexamethasone Nausea Only  . Betaxolol Anxiety  . Diazepam Anxiety    hallucinations  . Other Anxiety    Pain medicine in general causes hallucinations  . Timolol Anxiety    SOCIAL HISTORY:  History  Substance Use Topics  . Smoking status: Never Smoker   . Smokeless tobacco: Not on file  . Alcohol Use: No    FAMILY HISTORY: History reviewed. No pertinent family history.  EXAM: Triage Vitals: BP 175/63  Pulse 70  Resp 23  Ht 5\' 4"  (1.626 m)  Wt 230 lb (104.327 kg)  BMI 39.46 kg/m2  SpO2 100% CONSTITUTIONAL: Alert and oriented and responds appropriately to questions. Well-appearing; well-nourished HEAD: Normocephalic EYES: Conjunctivae clear, PERRL ENT: normal nose; no rhinorrhea; moist mucous membranes; pharynx without lesions noted NECK: Supple, no meningismus, no LAD  CARD: RRR; S1 and S2 appreciated; no murmurs, no clicks, no rubs, no gallops RESP: Normal chest excursion without splinting or tachypnea; sounds equal bilaterally but there are scattered diffuse expiratory wheezing, no rhonchi or rales, no respiratory distress but when patient begins coughing for several minutes afterward  she is tachypnea and only able to speak short sentences ABD/GI: Normal bowel sounds; mild abdominal distension without tympany or fluid wave; soft, non-tender, no rebound, no guarding BACK:  The back appears normal and is non-tender to palpation, there is no CVA tenderness EXT: Status post Bilateral AKA; Normal ROM in all joints; non-tender to palpation; no edema; normal capillary refill; no cyanosis   SKIN: Normal color for age and race; warm NEURO: Moves all extremities equally PSYCH: The patient's mood and manner are appropriate. Grooming and personal hygiene are appropriate.  MEDICAL DECISION MAKING: Patient here with chest tightness and shortness of breath as well as chills and cough. Concern  for possible pneumonia versus CHF exacerbation versus CAD. No history of reactive airway disease. She does have scattered wheezing on exam. Will give duo neb treatment. Will obtain labs including troponin, BNP and chest x-ray. EKG shows no new ischemic changes. We'll also obtain acute abdominal series to evaluate for small bowel obstruction. Suspect constipation. Patient states she thinks she will feel better if she is able to have an enema and she thinks this will also help with her shortness of breath she states she feels "full". If x-ray negative, will give enema. She has no fluid wave to suggest ascites. No abdominal pain on exam. Doubt any perforation, colitis, diverticulitis, pancreatitis, cholecystitis.  ED PROGRESS: Patient reports feeling much better after DuoNeb treatment. Her lungs are now clear to auscultation and she no longer has chest tightness or shortness of breath. Her cough is also improved. Chest x-ray shows no infiltrate. She does have mild stable CHF. Her BNP is 700. Patient reports her shortness of breath and chest tightness has been constant for the past 24 hours and she has a negative troponin. I feel this is more likely caused by a URI and given her symptoms of cough, chills and  improvement after DuoNeb. Discussed with family the option of coming to the hospital for observation the patient would like discharge home and daughters are comfortable with this plan. We'll discharge with prescription for albuterol inhaler to use as needed. Have discussed with him strict return precautions. Her abdominal x-ray shows constipation but no free air or obstruction. Have discussed with him that he can use over-the-counter MiraLAX, Colace and enemas. Have offered an enema here in the ED which they refuse at this time. Have discussed strict return precautions. They verbalize understanding and are comfortable with plan.     EKG Interpretation  Date/Time:  Wednesday September 12 2014 20:29:57 EDT Ventricular Rate:  67 PR Interval:  278 QRS Duration: 112 QT Interval:  415 QTC Calculation: 438 R Axis:   -38 Text Interpretation:  Sinus rhythm Prolonged PR interval LVH with secondary repolarization abnormality Anterior infarct, old No significant change since last tracing Confirmed by WARD,  DO, KRISTEN ([id number]) on 09/12/2014 8:34:06 PM         I personally performed the services described in this documentation, which was scribed in my presence. The recorded information has been reviewed and is accurate.   [place], DO 09/12/14 2349

## 2014-12-18 ENCOUNTER — Encounter (HOSPITAL_COMMUNITY): Payer: Self-pay | Admitting: Emergency Medicine

## 2014-12-18 ENCOUNTER — Emergency Department (HOSPITAL_COMMUNITY): Payer: Medicare Other

## 2014-12-18 ENCOUNTER — Inpatient Hospital Stay (HOSPITAL_COMMUNITY): Payer: Medicare Other

## 2014-12-18 ENCOUNTER — Inpatient Hospital Stay (HOSPITAL_COMMUNITY)
Admission: EM | Admit: 2014-12-18 | Discharge: 2015-01-14 | DRG: 208 | Disposition: E | Payer: Medicare Other | Attending: Internal Medicine | Admitting: Internal Medicine

## 2014-12-18 DIAGNOSIS — G936 Cerebral edema: Secondary | ICD-10-CM | POA: Diagnosis present

## 2014-12-18 DIAGNOSIS — Z89022 Acquired absence of left finger(s): Secondary | ICD-10-CM

## 2014-12-18 DIAGNOSIS — D696 Thrombocytopenia, unspecified: Secondary | ICD-10-CM | POA: Diagnosis present

## 2014-12-18 DIAGNOSIS — R778 Other specified abnormalities of plasma proteins: Secondary | ICD-10-CM

## 2014-12-18 DIAGNOSIS — Z794 Long term (current) use of insulin: Secondary | ICD-10-CM

## 2014-12-18 DIAGNOSIS — Z515 Encounter for palliative care: Secondary | ICD-10-CM

## 2014-12-18 DIAGNOSIS — G9340 Encephalopathy, unspecified: Secondary | ICD-10-CM

## 2014-12-18 DIAGNOSIS — Z79899 Other long term (current) drug therapy: Secondary | ICD-10-CM | POA: Diagnosis not present

## 2014-12-18 DIAGNOSIS — E872 Acidosis: Secondary | ICD-10-CM | POA: Diagnosis present

## 2014-12-18 DIAGNOSIS — I959 Hypotension, unspecified: Secondary | ICD-10-CM | POA: Diagnosis present

## 2014-12-18 DIAGNOSIS — E119 Type 2 diabetes mellitus without complications: Secondary | ICD-10-CM | POA: Diagnosis present

## 2014-12-18 DIAGNOSIS — E1165 Type 2 diabetes mellitus with hyperglycemia: Secondary | ICD-10-CM | POA: Diagnosis present

## 2014-12-18 DIAGNOSIS — D649 Anemia, unspecified: Secondary | ICD-10-CM

## 2014-12-18 DIAGNOSIS — N184 Chronic kidney disease, stage 4 (severe): Secondary | ICD-10-CM | POA: Diagnosis present

## 2014-12-18 DIAGNOSIS — I2510 Atherosclerotic heart disease of native coronary artery without angina pectoris: Secondary | ICD-10-CM | POA: Diagnosis present

## 2014-12-18 DIAGNOSIS — Z89612 Acquired absence of left leg above knee: Secondary | ICD-10-CM | POA: Diagnosis not present

## 2014-12-18 DIAGNOSIS — R748 Abnormal levels of other serum enzymes: Secondary | ICD-10-CM | POA: Diagnosis present

## 2014-12-18 DIAGNOSIS — S88911A Complete traumatic amputation of right lower leg, level unspecified, initial encounter: Secondary | ICD-10-CM

## 2014-12-18 DIAGNOSIS — I739 Peripheral vascular disease, unspecified: Secondary | ICD-10-CM | POA: Diagnosis present

## 2014-12-18 DIAGNOSIS — Z8673 Personal history of transient ischemic attack (TIA), and cerebral infarction without residual deficits: Secondary | ICD-10-CM

## 2014-12-18 DIAGNOSIS — K219 Gastro-esophageal reflux disease without esophagitis: Secondary | ICD-10-CM | POA: Diagnosis present

## 2014-12-18 DIAGNOSIS — G931 Anoxic brain damage, not elsewhere classified: Secondary | ICD-10-CM | POA: Diagnosis present

## 2014-12-18 DIAGNOSIS — R570 Cardiogenic shock: Secondary | ICD-10-CM | POA: Diagnosis present

## 2014-12-18 DIAGNOSIS — N289 Disorder of kidney and ureter, unspecified: Secondary | ICD-10-CM

## 2014-12-18 DIAGNOSIS — I129 Hypertensive chronic kidney disease with stage 1 through stage 4 chronic kidney disease, or unspecified chronic kidney disease: Secondary | ICD-10-CM | POA: Diagnosis present

## 2014-12-18 DIAGNOSIS — J9600 Acute respiratory failure, unspecified whether with hypoxia or hypercapnia: Principal | ICD-10-CM | POA: Diagnosis present

## 2014-12-18 DIAGNOSIS — N179 Acute kidney failure, unspecified: Secondary | ICD-10-CM | POA: Diagnosis present

## 2014-12-18 DIAGNOSIS — Z978 Presence of other specified devices: Secondary | ICD-10-CM

## 2014-12-18 DIAGNOSIS — Z89611 Acquired absence of right leg above knee: Secondary | ICD-10-CM

## 2014-12-18 DIAGNOSIS — I5030 Unspecified diastolic (congestive) heart failure: Secondary | ICD-10-CM | POA: Diagnosis present

## 2014-12-18 DIAGNOSIS — E1351 Other specified diabetes mellitus with diabetic peripheral angiopathy without gangrene: Secondary | ICD-10-CM | POA: Diagnosis present

## 2014-12-18 DIAGNOSIS — I252 Old myocardial infarction: Secondary | ICD-10-CM | POA: Diagnosis not present

## 2014-12-18 DIAGNOSIS — I10 Essential (primary) hypertension: Secondary | ICD-10-CM | POA: Diagnosis present

## 2014-12-18 DIAGNOSIS — E785 Hyperlipidemia, unspecified: Secondary | ICD-10-CM | POA: Diagnosis present

## 2014-12-18 DIAGNOSIS — G4733 Obstructive sleep apnea (adult) (pediatric): Secondary | ICD-10-CM | POA: Diagnosis present

## 2014-12-18 DIAGNOSIS — I4510 Unspecified right bundle-branch block: Secondary | ICD-10-CM | POA: Diagnosis present

## 2014-12-18 DIAGNOSIS — G9382 Brain death: Secondary | ICD-10-CM | POA: Diagnosis not present

## 2014-12-18 DIAGNOSIS — I469 Cardiac arrest, cause unspecified: Secondary | ICD-10-CM | POA: Diagnosis present

## 2014-12-18 DIAGNOSIS — E118 Type 2 diabetes mellitus with unspecified complications: Secondary | ICD-10-CM

## 2014-12-18 DIAGNOSIS — Z66 Do not resuscitate: Secondary | ICD-10-CM | POA: Diagnosis not present

## 2014-12-18 DIAGNOSIS — IMO0002 Reserved for concepts with insufficient information to code with codable children: Secondary | ICD-10-CM | POA: Diagnosis present

## 2014-12-18 DIAGNOSIS — J690 Pneumonitis due to inhalation of food and vomit: Secondary | ICD-10-CM | POA: Diagnosis present

## 2014-12-18 DIAGNOSIS — R7989 Other specified abnormal findings of blood chemistry: Secondary | ICD-10-CM

## 2014-12-18 DIAGNOSIS — N189 Chronic kidney disease, unspecified: Secondary | ICD-10-CM

## 2014-12-18 DIAGNOSIS — S88912A Complete traumatic amputation of left lower leg, level unspecified, initial encounter: Secondary | ICD-10-CM

## 2014-12-18 DIAGNOSIS — Z01818 Encounter for other preprocedural examination: Secondary | ICD-10-CM

## 2014-12-18 LAB — URINALYSIS, ROUTINE W REFLEX MICROSCOPIC
Bilirubin Urine: NEGATIVE
Glucose, UA: 250 mg/dL — AB
Ketones, ur: NEGATIVE mg/dL
Leukocytes, UA: NEGATIVE
Nitrite: NEGATIVE
PROTEIN: 100 mg/dL — AB
SPECIFIC GRAVITY, URINE: 1.018 (ref 1.005–1.030)
UROBILINOGEN UA: 0.2 mg/dL (ref 0.0–1.0)
pH: 5 (ref 5.0–8.0)

## 2014-12-18 LAB — BASIC METABOLIC PANEL
Anion gap: 11 (ref 5–15)
Anion gap: 14 (ref 5–15)
BUN: 28 mg/dL — AB (ref 6–23)
BUN: 29 mg/dL — AB (ref 6–23)
CALCIUM: 9.2 mg/dL (ref 8.4–10.5)
CHLORIDE: 101 meq/L (ref 96–112)
CO2: 22 mmol/L (ref 19–32)
CO2: 22 mmol/L (ref 19–32)
CREATININE: 2.76 mg/dL — AB (ref 0.50–1.10)
Calcium: 9.1 mg/dL (ref 8.4–10.5)
Chloride: 104 mEq/L (ref 96–112)
Creatinine, Ser: 2.82 mg/dL — ABNORMAL HIGH (ref 0.50–1.10)
GFR calc Af Amer: 17 mL/min — ABNORMAL LOW (ref >90)
GFR calc Af Amer: 16 mL/min — ABNORMAL LOW (ref >90)
GFR, EST NON AFRICAN AMERICAN: 14 mL/min — AB (ref >90)
GFR, EST NON AFRICAN AMERICAN: 15 mL/min — AB (ref >90)
Glucose, Bld: 286 mg/dL — ABNORMAL HIGH (ref 70–99)
Glucose, Bld: 296 mg/dL — ABNORMAL HIGH (ref 70–99)
POTASSIUM: 4.9 mmol/L (ref 3.5–5.1)
Potassium: 4.3 mmol/L (ref 3.5–5.1)
SODIUM: 137 mmol/L (ref 135–145)
Sodium: 137 mmol/L (ref 135–145)

## 2014-12-18 LAB — POCT I-STAT 3, ART BLOOD GAS (G3+)
Acid-base deficit: 4 mmol/L — ABNORMAL HIGH (ref 0.0–2.0)
Bicarbonate: 19.5 mEq/L — ABNORMAL LOW (ref 20.0–24.0)
O2 Saturation: 100 %
PCO2 ART: 30.4 mmHg — AB (ref 35.0–45.0)
PH ART: 7.416 (ref 7.350–7.450)
PO2 ART: 218 mmHg — AB (ref 80.0–100.0)
Patient temperature: 99.3
TCO2: 20 mmol/L (ref 0–100)

## 2014-12-18 LAB — CBC WITH DIFFERENTIAL/PLATELET
BASOS ABS: 0 10*3/uL (ref 0.0–0.1)
BASOS PCT: 0 % (ref 0–1)
Band Neutrophils: 0 % (ref 0–10)
Basophils Absolute: 0 10*3/uL (ref 0.0–0.1)
Basophils Relative: 0 % (ref 0–1)
Blasts: 0 %
EOS ABS: 0 10*3/uL (ref 0.0–0.7)
EOS ABS: 0.6 10*3/uL (ref 0.0–0.7)
EOS PCT: 5 % (ref 0–5)
Eosinophils Relative: 0 % (ref 0–5)
HCT: 36.8 % (ref 36.0–46.0)
HEMATOCRIT: 33.8 % — AB (ref 36.0–46.0)
Hemoglobin: 11.2 g/dL — ABNORMAL LOW (ref 12.0–15.0)
Hemoglobin: 12.3 g/dL (ref 12.0–15.0)
LYMPHS ABS: 3.2 10*3/uL (ref 0.7–4.0)
LYMPHS PCT: 8 % — AB (ref 12–46)
Lymphocytes Relative: 25 % (ref 12–46)
Lymphs Abs: 1.2 10*3/uL (ref 0.7–4.0)
MCH: 31.6 pg (ref 26.0–34.0)
MCH: 30.1 pg (ref 26.0–34.0)
MCHC: 33.1 g/dL (ref 30.0–36.0)
MCHC: 33.4 g/dL (ref 30.0–36.0)
MCV: 95.5 fL (ref 78.0–100.0)
MCV: 90.2 fL (ref 78.0–100.0)
MONO ABS: 0 10*3/uL — AB (ref 0.1–1.0)
MONO ABS: 1.2 10*3/uL — AB (ref 0.1–1.0)
MONOS PCT: 0 % — AB (ref 3–12)
MYELOCYTES: 0 %
Metamyelocytes Relative: 0 %
Monocytes Relative: 8 % (ref 3–12)
NEUTROS ABS: 8.8 10*3/uL — AB (ref 1.7–7.7)
NEUTROS PCT: 70 % (ref 43–77)
NEUTROS PCT: 84 % — AB (ref 43–77)
Neutro Abs: 13.1 10*3/uL — ABNORMAL HIGH (ref 1.7–7.7)
PLATELETS: 159 10*3/uL (ref 150–400)
Platelets: 140 10*3/uL — ABNORMAL LOW (ref 150–400)
Promyelocytes Absolute: 0 %
RBC: 3.54 MIL/uL — ABNORMAL LOW (ref 3.87–5.11)
RBC: 4.08 MIL/uL (ref 3.87–5.11)
RDW: 13.5 % (ref 11.5–15.5)
RDW: 13.4 % (ref 11.5–15.5)
WBC: 12.6 10*3/uL — AB (ref 4.0–10.5)
WBC: 15.5 10*3/uL — ABNORMAL HIGH (ref 4.0–10.5)
nRBC: 0 /100 WBC

## 2014-12-18 LAB — GLUCOSE, CAPILLARY
GLUCOSE-CAPILLARY: 249 mg/dL — AB (ref 70–99)
Glucose-Capillary: 298 mg/dL — ABNORMAL HIGH (ref 70–99)
Glucose-Capillary: 264 mg/dL — ABNORMAL HIGH (ref 70–99)
Glucose-Capillary: 235 mg/dL — ABNORMAL HIGH (ref 70–99)
Glucose-Capillary: 196 mg/dL — ABNORMAL HIGH (ref 70–99)
Glucose-Capillary: 202 mg/dL — ABNORMAL HIGH (ref 70–99)

## 2014-12-18 LAB — TROPONIN I
TROPONIN I: 0.13 ng/mL — AB (ref <0.031)
TROPONIN I: 0.3 ng/mL — AB (ref <0.031)
Troponin I: 0.53 ng/mL (ref <0.031)

## 2014-12-18 LAB — URINE MICROSCOPIC-ADD ON

## 2014-12-18 LAB — MRSA PCR SCREENING: MRSA BY PCR: POSITIVE — AB

## 2014-12-18 LAB — CBG MONITORING, ED: Glucose-Capillary: 235 mg/dL — ABNORMAL HIGH (ref 70–99)

## 2014-12-18 LAB — LACTIC ACID, PLASMA
Lactic Acid, Venous: 7.4 mmol/L — ABNORMAL HIGH (ref 0.5–2.2)
Lactic Acid, Venous: 4.6 mmol/L — ABNORMAL HIGH (ref 0.5–2.2)

## 2014-12-18 LAB — PROTIME-INR
INR: 1.23 (ref 0.00–1.49)
PROTHROMBIN TIME: 15.6 s — AB (ref 11.6–15.2)

## 2014-12-18 LAB — PROCALCITONIN: PROCALCITONIN: 0.13 ng/mL

## 2014-12-18 MED ORDER — NOREPINEPHRINE BITARTRATE 1 MG/ML IV SOLN
0.0000 ug/min | INTRAVENOUS | Status: DC
  Administered 2014-12-18: 5 ug/min via INTRAVENOUS
  Administered 2014-12-19: 4 ug/min via INTRAVENOUS
  Filled 2014-12-18 (×2): qty 4

## 2014-12-18 MED ORDER — SODIUM BICARBONATE 8.4 % IV SOLN
INTRAVENOUS | Status: AC | PRN
  Administered 2014-12-18: 50 meq via INTRAVENOUS

## 2014-12-18 MED ORDER — EPINEPHRINE HCL 1 MG/ML IJ SOLN
INTRAMUSCULAR | Status: AC
  Filled 2014-12-18: qty 4

## 2014-12-18 MED ORDER — ENOXAPARIN SODIUM 30 MG/0.3ML ~~LOC~~ SOLN
30.0000 mg | SUBCUTANEOUS | Status: DC
  Filled 2014-12-18: qty 0.3

## 2014-12-18 MED ORDER — EPINEPHRINE HCL 0.1 MG/ML IJ SOSY
PREFILLED_SYRINGE | INTRAMUSCULAR | Status: AC | PRN
  Administered 2014-12-18 (×5): 1 mg via INTRAVENOUS

## 2014-12-18 MED ORDER — INSULIN ASPART 100 UNIT/ML ~~LOC~~ SOLN
0.0000 [IU] | SUBCUTANEOUS | Status: DC
  Administered 2014-12-18: 8 [IU] via SUBCUTANEOUS
  Administered 2014-12-18: 3 [IU] via SUBCUTANEOUS
  Administered 2014-12-18: 5 [IU] via SUBCUTANEOUS
  Administered 2014-12-18: 8 [IU] via SUBCUTANEOUS
  Administered 2014-12-19 (×3): 5 [IU] via SUBCUTANEOUS

## 2014-12-18 MED ORDER — PANTOPRAZOLE SODIUM 40 MG PO PACK
40.0000 mg | PACK | Freq: Every day | ORAL | Status: DC
  Administered 2014-12-18 – 2014-12-19 (×2): 40 mg
  Filled 2014-12-18 (×3): qty 20

## 2014-12-18 MED ORDER — IOHEXOL 350 MG/ML SOLN
100.0000 mL | Freq: Once | INTRAVENOUS | Status: AC | PRN
  Administered 2014-12-18: 100 mL via INTRAVENOUS

## 2014-12-18 MED ORDER — HEPARIN SODIUM (PORCINE) 5000 UNIT/ML IJ SOLN
5000.0000 [IU] | Freq: Three times a day (TID) | INTRAMUSCULAR | Status: DC
  Administered 2014-12-18 – 2014-12-19 (×3): 5000 [IU] via SUBCUTANEOUS
  Filled 2014-12-18 (×5): qty 1

## 2014-12-18 MED ORDER — MUPIROCIN 2 % EX OINT
1.0000 "application " | TOPICAL_OINTMENT | Freq: Two times a day (BID) | CUTANEOUS | Status: DC
  Administered 2014-12-18 – 2014-12-19 (×3): 1 via NASAL
  Filled 2014-12-18: qty 22

## 2014-12-18 MED ORDER — CALCIUM CHLORIDE 10 % IV SOLN
INTRAVENOUS | Status: AC | PRN
  Administered 2014-12-18: 1 g via INTRAVENOUS

## 2014-12-18 MED ORDER — CHLORHEXIDINE GLUCONATE CLOTH 2 % EX PADS
6.0000 | MEDICATED_PAD | Freq: Every day | CUTANEOUS | Status: DC
  Administered 2014-12-19: 6 via TOPICAL

## 2014-12-18 MED ORDER — CHLORHEXIDINE GLUCONATE 0.12 % MT SOLN
15.0000 mL | Freq: Two times a day (BID) | OROMUCOSAL | Status: DC
  Administered 2014-12-18 – 2014-12-19 (×3): 15 mL via OROMUCOSAL
  Filled 2014-12-18 (×3): qty 15

## 2014-12-18 MED ORDER — SODIUM CHLORIDE 0.9 % IJ SOLN
3.0000 mL | Freq: Two times a day (BID) | INTRAMUSCULAR | Status: DC
  Administered 2014-12-18 – 2014-12-19 (×2): 3 mL via INTRAVENOUS

## 2014-12-18 MED ORDER — PIPERACILLIN-TAZOBACTAM IN DEX 2-0.25 GM/50ML IV SOLN
2.2500 g | Freq: Three times a day (TID) | INTRAVENOUS | Status: DC
  Administered 2014-12-18 – 2014-12-19 (×4): 2.25 g via INTRAVENOUS
  Filled 2014-12-18 (×5): qty 50

## 2014-12-18 MED ORDER — PIPERACILLIN-TAZOBACTAM 3.375 G IVPB 30 MIN
3.3750 g | Freq: Once | INTRAVENOUS | Status: AC
  Administered 2014-12-18: 3.375 g via INTRAVENOUS
  Filled 2014-12-18: qty 50

## 2014-12-18 MED ORDER — DEXTROSE 5 % IV SOLN
0.5000 ug/min | INTRAVENOUS | Status: DC
  Administered 2014-12-18: 1.013 ug/min via INTRAVENOUS
  Filled 2014-12-18: qty 4

## 2014-12-18 MED ORDER — VANCOMYCIN HCL IN DEXTROSE 1-5 GM/200ML-% IV SOLN
1000.0000 mg | Freq: Once | INTRAVENOUS | Status: AC
  Administered 2014-12-18: 1000 mg via INTRAVENOUS
  Filled 2014-12-18: qty 200

## 2014-12-18 MED ORDER — SODIUM CHLORIDE 0.9 % IV SOLN
INTRAVENOUS | Status: DC
Start: 2014-12-18 — End: 2014-12-19
  Administered 2014-12-18: 19:00:00 via INTRAVENOUS

## 2014-12-18 NOTE — ED Notes (Signed)
Patient back from CT. Carotid pulses and femoral pulses strong.

## 2014-12-18 NOTE — Procedures (Signed)
History: 81 yo F s/p cardiac arrest.   Sedation: None  Technique: This is a 17 channel routine scalp EEG performed at the bedside with bipolar and monopolar montages arranged in accordance to the international 10/20 system of electrode placement. One channel was dedicated to EKG recording.    Background: The predominance of the background is very low voltage with mild delta activtiy. In addition to this, there is focal F8, T8 > P8 delta activity at 3 - 4 Hz. There is no periodicity, rhythmicity, or evolution to suggest seizure. There was no PDR seen.   Photic stimulation: Physiologic driving is not performed  EEG Abnormalities: 1) Focal right temporal delta activity.  2) low voltage EEG 3) absent PDR  Clinical Interpretation: This EEG is most consistent with a right temporal focal area of cerebral dysfunction in the setting of a generalized non-specific cerebral dysfunction(encephalopathy). In the setting of hypoxia, however, this could also be consistent with widespread cortical dysfunction with the delta activity seen int eh right temporal region representing a relatively spared area.   There was no seizure or seizure predisposition recorded on this study.   Roland Rack, MD [facility] [phone]  If 7pm- 7am, please page neurology on call as listed in [place].

## 2014-12-18 NOTE — ED Notes (Signed)
Family at bedside. AC called for Epi drip.

## 2014-12-18 NOTE — ED Notes (Signed)
No pulses, no cardiac activity on monitor. CPR started.

## 2014-12-18 NOTE — Progress Notes (Signed)
Inpatient Diabetes Program Recommendations  AACE/ADA: New Consensus Statement on Inpatient Glycemic Control (2013)  Target Ranges:  Prepandial:   less than 140 mg/dL      Peak postprandial:   less than 180 mg/dL (1-2 hours)      Critically ill patients:  140 - 180 mg/dL   Reason for Assessment:  Results for [name], [name] (MRN [id number]) as of 01/07/2015 09:52  Ref. Range 12/27/2014 04:24 01/10/2015 05:26 12/18/2014 08:20  Glucose-Capillary Latest Range: 70-99 mg/dL 235 (H) 249 (H) 298 (H)    Diabetes history: Type 2 diabetes Outpatient Diabetes medications: Lantus 65 units q AM, Novolog 5-20 units bid Current orders for Inpatient glycemic control:  Novolog moderate q 4 hours  Please consider ICU Glycemic control protocol part 2, IV insulin.  Thanks, Adah Perl, RN, BC-ADM Inpatient Diabetes Coordinator Pager [phone]

## 2014-12-18 NOTE — H&P (Signed)
PULMONARY / CRITICAL CARE MEDICINE   Name: [name] MRN: [id number] DOB: Mar 30, 1926    ADMISSION DATE:  01/02/2015 CONSULTATION DATE:  01/09/2015  REFERRING MD :  [name], MD  CHIEF COMPLAINT:  Cardiac arrest  INITIAL PRESENTATION: 81 year old woman with hx of DM2, HTN, GERD, CHF, CAD, PVD, OSA presenting with respiratory distress and cardiac arrest.  STUDIES:  CTA chest 1/5 >> No PE, bilateral dependent airspace consolidation CT head 1/5 >> No acute intracranial abnormalities, mucosal thickening throughout paranasal sinuses.  SIGNIFICANT EVENTS: 1/5 >> Cardiac arrest, intubated, placed on epi gtt.total down time 45 min PEA  HISTORY OF PRESENT ILLNESS:  81 year old woman with hx of DM2, HTN, GERD, CHF, CAD, PVD, OSA presenting with respiratory distress and cardiac arrest. Per EMR, pt's family reports she was recently treated for respiratory infection with abx. Had acute respiratory distress last night. En route to [facility] ED and in ED, she was in asystole and received multiple rounds of epinephrine and bicarb. Placed on epi gtt.   PAST MEDICAL HISTORY :   has a past medical history of Diabetes mellitus; Hypertension; Hyperlipidemia; GERD (gastroesophageal reflux disease); Arthritis; Anxiety; Psoriasis; CHF (congestive heart failure); History of detached retina repair; Complication of anesthesia; PONV (postoperative nausea and vomiting); Myocardial infarction (1980's); TIA (transient ischemic attack); History of blood transfusion; Peripheral vascular disease; Nodule of chest wall (2010); CAD (coronary artery disease); Depression; Shortness of breath; Sleep apnea; History of UTI; Stroke; Headache(784.0); Cancer; and No pertinent past medical history.  has past surgical history that includes Femoral-Peroneal BPG  (2006); Above knee leg amputaton (12/12/09); Abdominal hysterectomy; Appendectomy; Eye surgery; Tonsillectomy and adenoidectomy; Umbilical hernia repair; Finger amputation (1997);  Above knee leg amputaton (06/29/11); Breast surgery (1995); No past surgeries; and Anterior chamber washout (08/12/2012). Prior to Admission medications   Medication Sig Start Date End Date Taking? Authorizing Provider  acetaminophen (TYLENOL) 500 MG tablet Take 500 mg by mouth daily as needed for mild pain or moderate pain.    Historical Provider, MD  acetic acid (VOSOL) 2 % otic solution Place 5 drops in ear(s) 3 (three) times daily.     Historical Provider, MD  albuterol (PROAIR HFA) 108 (90 BASE) MCG/ACT inhaler Inhale 2 puffs into the lungs every 6 (six) hours as needed for wheezing or shortness of breath.    Historical Provider, MD  albuterol (PROVENTIL HFA;VENTOLIN HFA) 108 (90 BASE) MCG/ACT inhaler Inhale 2 puffs into the lungs every 4 (four) hours as needed for wheezing or shortness of breath. 09/12/14   [name], DO  allopurinol (ZYLOPRIM) 300 MG tablet Take 300 mg by mouth every other day.    Historical Provider, MD  amLODipine (NORVASC) 10 MG tablet Take 10 mg by mouth every morning.     Historical Provider, MD  docusate sodium (COLACE) 100 MG capsule Take 100 mg by mouth as needed. For constipation.    Historical Provider, MD  dorzolamide-timolol (COSOPT) 22.3-6.8 MG/ML ophthalmic solution Place 1 drop into both eyes 2 (two) times daily.    Historical Provider, MD  furosemide (LASIX) 20 MG tablet Take 20 mg by mouth daily.      Historical Provider, MD  insulin glargine (LANTUS) 100 UNIT/ML injection Inject 65 Units into the skin every morning.     Historical Provider, MD  insulin lispro (HUMALOG) 100 UNIT/ML injection Inject 5-20 Units into the skin 2 (two) times daily with a meal. Sliding scale    Historical Provider, MD  LORazepam (ATIVAN) 0.5 MG tablet Take  0.5 mg by mouth every 4 (four) hours as needed for anxiety.    Historical Provider, MD  mometasone (ELOCON) 0.1 % lotion Apply 2 drops topically daily as needed (for irritation).    Historical Provider, MD  Multiple  Vitamins-Minerals (CENTRUM SILVER ADULT 50+ PO) Take 1 tablet by mouth daily.    Historical Provider, MD  nystatin cream (MYCOSTATIN) Apply 1 application topically daily. *Applied under breast    Historical Provider, MD  pantoprazole (PROTONIX) 40 MG tablet Take 40 mg by mouth every other day.     Historical Provider, MD   Allergies  Allergen Reactions  . Tramadol Anaphylaxis    Caused cardiac arrest after leg amputation  . Aspirin   . Atorvastatin   . Codeine     Hallucinations/Passes Out   . Demerol [Meperidine]     Hallucinations/Passes Out   . Fentanyl     Hallucinations/Passes Out   . Morphine And Related     Hallucinations/Passes Out   . Nitrofurantoin     REACTION: Trouble breathing, and swallowing.  Marland Kitchen Penicillin G Nausea Only  . Penicillins   . Rosuvastatin   . Sulfamethoxazole Nausea Only  . Sulfonamide Derivatives   . Tobramycin-Dexamethasone Nausea Only  . Betaxolol Anxiety  . Diazepam Anxiety    hallucinations  . Other Anxiety    Pain medicine in general causes hallucinations  . Timolol Anxiety    FAMILY HISTORY:  has no family status information on file.  SOCIAL HISTORY:  reports that she has never smoked. She does not have any smokeless tobacco history on file. She reports that she does not drink alcohol or use illicit drugs.  REVIEW OF SYSTEMS:  Unable to obtain 2/2 mental status  SUBJECTIVE: NAD. Intubated.  VITAL SIGNS: Temp:  [99.1 F (37.3 C)-100 F (37.8 C)] 99.1 F (37.3 C) (01/05 0412) Pulse Rate:  [64-114] 89 (01/05 0412) Resp:  [13-30] 23 (01/05 0412) BP: (99-201)/(44-106) 129/71 mmHg (01/05 0412) SpO2:  [85 %-100 %] 100 % (01/05 0412) FiO2 (%):  [100 %] 100 % (01/05 0002) Weight:  [230 lb (104.327 kg)] 230 lb (104.327 kg) (01/05 0123) HEMODYNAMICS:   VENTILATOR SETTINGS: Vent Mode:  [-] PRVC FiO2 (%):  [100 %] 100 % Set Rate:  [16 bmp] 16 bmp Vt Set:  [550 mL] 550 mL PEEP:  [5 cmH20] 5 cmH20 Plateau Pressure:  [39 cmH20] 39  cmH20 INTAKE / OUTPUT:  Intake/Output Summary (Last 24 hours) at 12/20/2014 0530 Last data filed at 12/14/2014 0413  Gross per 24 hour  Intake      0 ml  Output    250 ml  Net   -250 ml    PHYSICAL EXAMINATION: General:  NAD Neuro:  Unresponsive, per 4 mm no response, no movement any ext, no cough HEENT:  [place]/AT, ETT in place Cardiovascular:  RRR Lungs:  Mechanical BS bilaterally Abdomen:  Soft, moderately distended Musculoskeletal:  Bilateral AKA Skin:  Warm and well perfused  LABS:  CBC  Recent Labs Lab 12/22/2014 0025  WBC 12.6*  HGB 11.2*  HCT 33.8*  PLT 140*   Coag's No results for input(s): APTT, INR in the last 168 hours. BMET  Recent Labs Lab 12/17/2014 0025  NA 137  K 4.3  CL 104  CO2 22  BUN 28*  CREATININE 2.76*  GLUCOSE 286*   Electrolytes  Recent Labs Lab 12/25/2014 0025  CALCIUM 9.1   Sepsis Markers  Recent Labs Lab 01/04/2015 0025 01/13/2015 0121  LATICACIDVEN 7.4*  --  PROCALCITON  --  0.13   ABG No results for input(s): PHART, PCO2ART, PO2ART in the last 168 hours. Liver Enzymes No results for input(s): AST, ALT, ALKPHOS, BILITOT, ALBUMIN in the last 168 hours. Cardiac Enzymes  Recent Labs Lab 01/12/2015 0025  TROPONINI 0.13*   Glucose  Recent Labs Lab 01/03/2015 0424  GLUCAP 235*    Imaging No results found.   ASSESSMENT / PLAN:  PULMONARY ETT 1/4 >> A: Acute respiratory failure ?aspiration PNA OSA P:   Full vent support Wean as tolerated ABG Portable CXR  CARDIOVASCULAR CVL >> A: Post cardiac arrest New RBBB - neg for PE on CTA chest Lactic acidosis CHF (echo 2010 LV EF 55-60%, 0/0923 grade 2 diastolic dysfunction) CAD PVD HTN P:  Switch epi gtt to Levo gtt Consider central line placement Repeat lactic acid Cycle troponins Repeat EKG  RENAL A:  Acute on CKD stage 4 P:   Repeat BMP NS @ 75 cc/hr  GASTROINTESTINAL A:  GERD P:   NPO Protonix for SUP  HEMATOLOGIC A:  VTE ppx P:  SQ  Lovenox SCDs Follow CBC  INFECTIOUS A:  ?aspiration PNA - procalcitonin 0.13 P:   BCx2 1/5 >> UC 1/5 >> Sputum Cx 1/5 >>  Abx:  Vanc, start date 1/5 >> Zosyn, start date 1/5 >>  ENDOCRINE A:  DM2 P:   SQ insulin per protocol  NEUROLOGIC A:  Acute encephalopathy P:   RASS goal: 0 to -1 fent prn   FAMILY  - Updates: Daughter, [name], updated.  - Inter-disciplinary family meet or Palliative Care meeting due by:  12/25/2014    TODAY'S SUMMARY: s/p cardiac arrest, intubated and placed on epi gtt. Switch to levo gtt.    Jacques Earthly, MD  Pulmonary and [place] Pager: [phone]  12/24/2014, 5:30 AM   STAFF NOTE: I, Merrie Roof, MD FACP have personally reviewed patient's available data, including medical history, events of note, physical examination and test results as part of my evaluation. I have discussed with resident/NP and other care providers such as pharmacist, RN and RRT. In addition, I personally evaluated patient and elicited key findings of: unresponsive to pain, likely poor outcome, not brain dead yet, dnr established, keep same mV, levo titrattion, eeg Please see my note also after this one  The patient is critically ill with multiple organ systems failure and requires high complexity decision making for assessment and support, frequent evaluation and titration of therapies, application of advanced monitoring technologies and extensive interpretation of multiple databases.   Critical Care Time devoted to patient care services described in this note is 45 Minutes in total descriobed  in last next note. This time reflects time of care of this signee: Merrie Roof, MD FACP. This critical care time does not reflect procedure time, or teaching time or supervisory time of PA/NP/Med student/Med Resident etc but could involve care discussion time. Rest per NP/medical resident whose note is outlined above and that I agree  with   Lavon Paganini. Titus Mould, MD, [place] Pgr: [facility] 01/10/2015 10:12 AM

## 2014-12-18 NOTE — ED Provider Notes (Signed)
CSN: [id number]     Arrival date & time 12/26/2014  0000 History   First MD Initiated Contact with Patient 12/15/2014 0020     Chief Complaint  Patient presents with  . Cardiac Arrest     (Consider location/radiation/quality/duration/timing/severity/associated sxs/prior Treatment) The history is provided by the EMS personnel and a relative.  81 year old female is brought into the ED with CPR in progress. EMS had been called to the home for respiratory difficulty and found her in respiratory arrest with family during CPR. They did insert a King airway and got an IV started and treated her with epinephrine and vasopressin. They did have return of spontaneous circulation with sinus tachycardia but then lost pulses again and she has been in asystole for the remainder of the time with EMS. They did lose the [facility] airway and were unable to reinsert it and were oxygenating her by bag-valve-mask. CPR had been in progress for about 30 minutes prior to arrival in the ED.  Family arrived later and then formulated she been treated for a respiratory infection over the past week and I just completed her course of antibiotics when she started to get very dyspneic tonight.  Past Medical History  Diagnosis Date  . Diabetes mellitus   . Hypertension   . Hyperlipidemia   . GERD (gastroesophageal reflux disease)   . Arthritis   . Anxiety   . Psoriasis   . CHF (congestive heart failure)   . History of detached retina repair     Bilateral  . Complication of anesthesia     History of cardiac arrest after being in admitted to room, pain medication does not agree with patient  . PONV (postoperative nausea and vomiting)   . Myocardial infarction 1980's  . TIA (transient ischemic attack)   . History of blood transfusion     d/t surgery  . Peripheral vascular disease   . Nodule of chest wall 2010    benign mass chest  . CAD (coronary artery disease)   . Depression   . Shortness of breath     WITH EXERT  .  Sleep apnea     Does not use CPAP    . History of UTI   . Stroke     MINI STROKES 7-8 YRS AGO  . Headache(784.0)   . Cancer     Left Breast  . No pertinent past medical history    Past Surgical History  Procedure Laterality Date  . Femoral-peroneal bpg   2006    Right leg  . 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 Abdominal hysterectomy    . Appendectomy    . Eye surgery      Bilateral cataract  . Tonsillectomy and adenoidectomy    . Umbilical hernia repair    . Finger amputation  1997    Left Third digit- distal end  . Above knee leg amputation  06/29/11    Right AKA by Dr. Donnetta Hutching  . Breast surgery  1995    Left Breast mastectomy  . No past surgeries      ESOPH  PROCEDURE  . Anterior chamber washout  08/12/2012    Procedure: ANTERIOR CHAMBER WASHOUT;  Surgeon: Hurman Horn, MD;  Location: [employer];  Service: Ophthalmology;  Laterality: Right;  Vitreous Washout   History reviewed. No pertinent family history. History  Substance Use Topics  . Smoking status: Never Smoker   . Smokeless tobacco: Not on file  .  Alcohol Use: No   OB History    No data available     Review of Systems  Unable to perform ROS: Patient unresponsive      Allergies  Tramadol; Aspirin; Atorvastatin; Codeine; Demerol; Fentanyl; Morphine and related; Nitrofurantoin; Penicillin g; Penicillins; Rosuvastatin; Sulfamethoxazole; Sulfonamide derivatives; Tobramycin-dexamethasone; Betaxolol; Diazepam; Other; and Timolol  Home Medications   Prior to Admission medications   Medication Sig Start Date End Date Taking? Authorizing Provider  acetaminophen (TYLENOL) 500 MG tablet Take 500 mg by mouth daily as needed for mild pain or moderate pain.    Historical Provider, MD  acetic acid (VOSOL) 2 % otic solution Place 5 drops in ear(s) 3 (three) times daily.     Historical Provider, MD  albuterol (PROAIR HFA) 108 (90 BASE) MCG/ACT inhaler Inhale 2 puffs into the lungs every 6 (six) hours as  needed for wheezing or shortness of breath.    Historical Provider, MD  albuterol (PROVENTIL HFA;VENTOLIN HFA) 108 (90 BASE) MCG/ACT inhaler Inhale 2 puffs into the lungs every 4 (four) hours as needed for wheezing or shortness of breath. 09/12/14   [name], DO  allopurinol (ZYLOPRIM) 300 MG tablet Take 300 mg by mouth every other day.    Historical Provider, MD  amLODipine (NORVASC) 10 MG tablet Take 10 mg by mouth every morning.     Historical Provider, MD  docusate sodium (COLACE) 100 MG capsule Take 100 mg by mouth as needed. For constipation.    Historical Provider, MD  dorzolamide-timolol (COSOPT) 22.3-6.8 MG/ML ophthalmic solution Place 1 drop into both eyes 2 (two) times daily.    Historical Provider, MD  furosemide (LASIX) 20 MG tablet Take 20 mg by mouth daily.      Historical Provider, MD  insulin glargine (LANTUS) 100 UNIT/ML injection Inject 65 Units into the skin every morning.     Historical Provider, MD  insulin lispro (HUMALOG) 100 UNIT/ML injection Inject 5-20 Units into the skin 2 (two) times daily with a meal. Sliding scale    Historical Provider, MD  LORazepam (ATIVAN) 0.5 MG tablet Take 0.5 mg by mouth every 4 (four) hours as needed for anxiety.    Historical Provider, MD  mometasone (ELOCON) 0.1 % lotion Apply 2 drops topically daily as needed (for irritation).    Historical Provider, MD  Multiple Vitamins-Minerals (CENTRUM SILVER ADULT 50+ PO) Take 1 tablet by mouth daily.    Historical Provider, MD  nystatin cream (MYCOSTATIN) Apply 1 application topically daily. *Applied under breast    Historical Provider, MD  pantoprazole (PROTONIX) 40 MG tablet Take 40 mg by mouth every other day.     Historical Provider, MD   BP 99/57 mmHg  Pulse 66  Resp 18  SpO2 93% Physical Exam  Nursing note and vitals reviewed.  81 year old female, with CPR in progress. Vital signs are unobtainable.  Head is normocephalic and atraumatic. Pupils are midposition and fixed.. Neck is  without adenopathy or JVD. Lungs have symmetric airflow. Chest is without deformity. Heart tones are absent. Abdomen is slightly distended and soft. Extremities: Bilateral above-the-knee amputations. Skin is warm and dry without rash. Neurologic: GCS=3.  ED Course  Procedures (including critical care time) INTUBATION Performed by: [facility]  Required items: required blood products, implants, devices, and special equipment available Patient identity confirmed: provided demographic data and hospital-assigned identification number Time out: Immediately prior to procedure a "time out" was called to verify the correct patient, procedure, equipment, support staff and site/side marked as required.  Indications: Cardiac arrest   Intubation method: Glidescope Laryngoscopy   Preoxygenation: BVM  Sedatives: None  Paralytic: None   Tube Size: 7.5 cuffed  Post-procedure assessment: chest rise and ETCO2 monitor Breath sounds: equal and absent over the epigastrium Tube secured with: ETT holder Chest x-ray interpreted by radiologist and me.  Chest x-ray findings: endotracheal tube was almost at the carina, and was then pulled back 2 cm.   Patient tolerated the procedure well with no immediate complications.    Labs Review Results for orders placed or performed during the hospital encounter of 12/24/2014  CBC with Differential  Result Value Ref Range   WBC 12.6 (H) 4.0 - 10.5 K/uL   RBC 3.54 (L) 3.87 - 5.11 MIL/uL   Hemoglobin 11.2 (L) 12.0 - 15.0 g/dL   HCT 33.8 (L) 36.0 - 46.0 %   MCV 95.5 78.0 - 100.0 fL   MCH 31.6 26.0 - 34.0 pg   MCHC 33.1 30.0 - 36.0 g/dL   RDW 13.5 11.5 - 15.5 %   Platelets 140 (L) 150 - 400 K/uL   Neutrophils Relative % 70 43 - 77 %   Lymphocytes Relative 25 12 - 46 %   Monocytes Relative 0 (L) 3 - 12 %   Eosinophils Relative 5 0 - 5 %   Basophils Relative 0 0 - 1 %   Band Neutrophils 0 0 - 10 %   Metamyelocytes Relative 0 %   Myelocytes 0 %    Promyelocytes Absolute 0 %   Blasts 0 %   nRBC 0 0 /100 WBC   Neutro Abs 8.8 (H) 1.7 - 7.7 K/uL   Lymphs Abs 3.2 0.7 - 4.0 K/uL   Monocytes Absolute 0.0 (L) 0.1 - 1.0 K/uL   Eosinophils Absolute 0.6 0.0 - 0.7 K/uL   Basophils Absolute 0.0 0.0 - 0.1 K/uL  Basic metabolic panel  Result Value Ref Range   Sodium 137 135 - 145 mmol/L   Potassium 4.3 3.5 - 5.1 mmol/L   Chloride 104 96 - 112 mEq/L   CO2 22 19 - 32 mmol/L   Glucose, Bld 286 (H) 70 - 99 mg/dL   BUN 28 (H) 6 - 23 mg/dL   Creatinine, Ser 2.76 (H) 0.50 - 1.10 mg/dL   Calcium 9.1 8.4 - 10.5 mg/dL   GFR calc non Af Amer 15 (L) >90 mL/min   GFR calc Af Amer 17 (L) >90 mL/min   Anion gap 11 5 - 15  Troponin I  Result Value Ref Range   Troponin I 0.13 (H) <0.031 ng/mL  Lactic acid, plasma  Result Value Ref Range   Lactic Acid, Venous 7.4 (H) 0.5 - 2.2 mmol/L  Procalcitonin  Result Value Ref Range   Procalcitonin 0.13 ng/mL  CBG monitoring, ED  Result Value Ref Range   Glucose-Capillary 235 (H) 70 - 99 mg/dL    Imaging Review Ct Head Wo Contrast  01/10/2015   ADDENDUM REPORT: 12/31/2014 03:07  ADDENDUM: Sulci appear somewhat less distinct than on prior study with slight indistinct appearance of gray-white junction. This may be due to differences in technique due to use a different scanners. However, in the setting of cardiac arrest, this could represent early ischemic change.   Electronically Signed   By: Lucienne Capers M.D.   On: 12/20/2014 03:07   01/05/2015   CLINICAL DATA:  Unresponsive and on a ventilator. Altered level of consciousness.  EXAM: CT HEAD WITHOUT CONTRAST  TECHNIQUE: Contiguous axial images were obtained from  the base of the skull through the vertex without intravenous contrast.  COMPARISON:  12/21/2009  FINDINGS: Ventricles and sulci appear symmetrical. Dystrophic are postinflammatory calcifications demonstrated along the tentorium anterior. No change since prior study. No mass effect or midline shift. No  abnormal extra-axial fluid collections. Gray-white matter junctions are distinct. Basal cisterns are not effaced. No evidence of acute intracranial hemorrhage. No depressed skull fractures. Mucosal thickening throughout the paranasal sinuses. Opacification of some of the right mastoid air cells. Vascular calcifications. Postoperative changes in the right orbit.  IMPRESSION: No acute intracranial abnormalities. Mucosal thickening throughout the paranasal sinuses. Postoperative changes in the right orbit.  Electronically Signed: By: Lucienne Capers M.D. On: 01/08/2015 02:58   Ct Angio Chest Pe W/cm &/or Wo Cm  12/22/2014   CLINICAL DATA:  Ventilated patient. Unresponsive. Status post cardiac arrest. Assess for pulmonary embolus.  EXAM: CT ANGIOGRAPHY CHEST WITH CONTRAST  TECHNIQUE: Multidetector CT imaging of the chest was performed using the standard protocol during bolus administration of intravenous contrast. Multiplanar CT image reconstructions and MIPs were obtained to evaluate the vascular anatomy.  CONTRAST:  14mL OMNIPAQUE IOHEXOL 350 MG/ML SOLN  COMPARISON:  CT of the chest performed 03/08/2014, and chest radiograph performed earlier today at 12:17 a.m.  FINDINGS: There is no evidence of significant pulmonary embolus.  Bilateral dependent airspace consolidation raises concern for pneumonia. Aspiration is a concern. There is no evidence of pleural effusion or pneumothorax. No masses are identified; no abnormal focal contrast enhancement is seen.  A 4.9 x 3.4 cm cystic structure is again noted in the right paratracheal region. This is stable from the prior study and may reflect a benign bronchogenic cyst. The endotracheal tube is seen ending 1-2 cm above the carina. There is mild distal tracheomalacia. There is question of hilar lymphadenopathy, difficult to fully assess due to airspace opacification. No pericardial effusion is seen. The great vessels are grossly unremarkable, aside from scattered  calcification. No axillary lymphadenopathy is seen. A 3.3 cm peripherally calcified mass is partially noted at the left thyroid lobe. This has increased in size from the prior study.  The visualized portions of the liver and spleen are unremarkable.  No acute osseous abnormalities are seen. Degenerative change is noted at the glenohumeral joints bilaterally.  Review of the MIP images confirms the above findings.  IMPRESSION: 1. No evidence of significant pulmonary embolus. 2. Bilateral dependent airspace opacification raises concern for pneumonia. Given the patient's presentation, this is concerning for aspiration. 3. Question of hilar lymphadenopathy, difficult to fully assess due to airspace opacification. 4. Endotracheal tube seen ending 1-2 cm above the carina. This could be retracted 1-2 cm, as deemed clinically appropriate. 5. 4.9 cm cystic structure at the right paratracheal region is stable and may reflect a benign bronchogenic cyst. 6. 3.3 cm peripherally calcified mass at the left thyroid lobe has increased in size. Further evaluation could be performed with thyroid ultrasound, as deemed clinically appropriate. If patient is clinically hyperthyroid, consider nuclear medicine thyroid uptake and scan.   Electronically Signed   By: Garald Balding M.D.   On: 01/13/2015 03:10   Dg Chest Portable 1 View  12/15/2014   CLINICAL DATA:  Cardiac and rest prior to arrival it ED tonight. Endotracheal tube placement.  EXAM: PORTABLE CHEST - 1 VIEW  COMPARISON:  09/12/2014  FINDINGS: Endotracheal tube placed with tip measuring about 7 mm above the carina. Shallow inspiration. Linear atelectasis in the right mid and lower lung. Atelectasis or consolidation in the  left upper lung. No blunting of costophrenic angles. No pneumothorax. Cardiac enlargement. Pulmonary vascularity is normal. Degenerative changes in the spine and shoulders.  IMPRESSION: Endotracheal tube tip measures only about 7 mm above the carinal. Shallow  inspiration with atelectasis in the right lung base and consolidation or atelectasis in the left upper lung.   Electronically Signed   By: Lucienne Capers M.D.   On: 01/09/2015 00:41   Images viewed by me.   EKG Interpretation   Date/Time:  Tuesday December 18 2014 00:14:24 EST Ventricular Rate:  113 PR Interval:    QRS Duration: 160 QT Interval:  416 QTC Calculation: 570 R Axis:   -87 Text Interpretation:  Undetermined rhythm Ventricular bigeminy RBBB and  LAFB Probable left ventricular hypertrophy When compared with ECG of  09/12/2014, Right bundle branch block and Left anterior fasicular block are  now Present J point elevation in III Confirmed by [facility]  MD, Om Lizotte ([id number])  on 01/05/2015 4:39:47 AM       EKG Interpretation   Date/Time:  Tuesday December 18 2014 00:47:47 EST Ventricular Rate:  68 PR Interval:    QRS Duration: 141 QT Interval:  461 QTC Calculation: 490 R Axis:   -89 Text Interpretation:  Junctional rhythm Right bundle branch block Left  anterior fasicular block When compared with ECG of 01/09/2015, HEART RATE  has decreased Confirmed by [facility]  MD, Karelyn Brisby ([id number]) on 12/29/2014 4:42:08 AM       Cardiopulmonary Resuscitation (CPR) Procedure Note Directed/Performed by: Delora Fuel I personally directed ancillary staff and/or performed CPR in an effort to regain return of spontaneous circulation and to maintain cardiac, neuro and systemic perfusion.   CRITICAL CARE Performed by: Delora Fuel Total critical care time: 150 minutes Critical care time was exclusive of separately billable procedures and treating other patients. Critical care was necessary to treat or prevent imminent or life-threatening deterioration. Critical care was time spent personally by me on the following activities: development of treatment plan with patient and/or surrogate as well as nursing, discussions with consultants, evaluation of patient's response to treatment, examination of patient,  obtaining history from patient or surrogate, ordering and performing treatments and interventions, ordering and review of laboratory studies, ordering and review of radiographic studies, pulse oximetry and re-evaluation of patient's condition.  MDM   Final diagnoses:  Cardiac arrest  Encounter for intubation  Aspiration pneumonia, unspecified aspiration pneumonia type  Acute on chronic renal insufficiency  Elevated lactic acid level  Elevated troponin I level  Thrombocytopenia  Normochromic normocytic anemia    Out of hospital cardiac arrest. On arrival, patient was in asystole. She was given additional epinephrine and sodium bicarbonate of and she was noted to be in an agonal rhythm with a rate of about 20. She is given additional epinephrine and heart rate increased. Bedside ultrasound did show cardiac activity and pulse was palpable. Blood initial blood pressure was 190/60. Heart rate gradually declined and she did require additional doses of epinephrine. She had one episode where she lost pulses but were regained following additional epinephrine. She is now being maintained on epinephrine drip. Family has arrived and have been informed of events and they have been in to see the patient and they do wish everything had to be done. They're aware that time likelihood of survival is low in likelihood of neurologically intact survival is very low.  Patient appeared to stabilize and case is discussed with hospitalist, Dr. Maudie Mercury, who has come to see the patient and talked with family and  felt that the patient be better cared for at [facility] under critical care service. I discussed case with Dr. Elsworth Soho of critical care service who was concerned that Bundle branch block could represent pulmonary embolism and requested that CT angiogram be obtained prior to patient being discharged. CT of the head was obtained while in CT showing probable evidence of anoxic encephalopathy. CT angiogram showed  probable area of aspiration but no pulmonary emboli. Patient was started on antibiotics for aspiration pneumonia which consist of vancomycin and Zosyn. During this time, hemodynamics remained stable with stable heart rate and stable blood pressure that was adequate. Arrangements are made to transfer her to [place], MD [id number]

## 2014-12-18 NOTE — ED Notes (Signed)
CPR held, pulses checked. PEA on monitor. No pulses at this time. CPR resumed.

## 2014-12-18 NOTE — Progress Notes (Signed)
EEG completed; results pending.    

## 2014-12-18 NOTE — ED Notes (Signed)
Pulses strong. Family at bedside. Dr. Roxanne Mins speaking with patient's family.

## 2014-12-18 NOTE — Code Documentation (Signed)
Patient intubated by Dr. Roxanne Mins. 7.5 ET tube. 24 at the lip.

## 2014-12-18 NOTE — ED Notes (Signed)
EMS called for shortness of breath. Family reports patient was in bed complaining of shortness of breath. On EMS arrival, patient was apneic. Patient was witnessed arrest. EMS initiated CPR, gave two rounds of Epi and 40 of Vasopressin. Patient then in sinus tachycardia. EMS reports lost pulses while in transport and gave 3 more rounds of Epi. On arrival to ED CPR in progress, no pulses present.

## 2014-12-18 NOTE — Progress Notes (Signed)
ANTIBIOTIC CONSULT NOTE - INITIAL  Pharmacy Consult for Zosyn Indication: Aspiration PNA  Allergies  Allergen Reactions  . Tramadol Anaphylaxis    Caused cardiac arrest after leg amputation  . Aspirin   . Atorvastatin   . Codeine     Hallucinations/Passes Out   . Demerol [Meperidine]     Hallucinations/Passes Out   . Fentanyl     Hallucinations/Passes Out   . Morphine And Related     Hallucinations/Passes Out   . Nitrofurantoin     REACTION: Trouble breathing, and swallowing.  Marland Kitchen Penicillin G Nausea Only  . Penicillins   . Rosuvastatin   . Sulfamethoxazole Nausea Only  . Sulfonamide Derivatives   . Tobramycin-Dexamethasone Nausea Only  . Betaxolol Anxiety  . Diazepam Anxiety    hallucinations  . Other Anxiety    Pain medicine in general causes hallucinations  . Timolol Anxiety    Patient Measurements: Weight: 230 lb (104.327 kg)  Vital Signs: Temp: 98.9 F (37.2 C) (01/05 0822) Temp Source: Oral (01/05 0822) BP: 143/62 mmHg (01/05 0945) Pulse Rate: 99 (01/05 0945) Intake/Output from previous day: 01/04 0701 - 01/05 0700 In: 67.6 [I.V.:37.6; NG/GT:30] Out: 450 [Urine:450] Intake/Output from this shift: Total I/O In: 175.6 [I.V.:175.6] Out: 75 [Urine:75]  Labs:  Recent Labs  01/01/2015 0025 12/20/2014 0610  WBC 12.6* 15.5*  HGB 11.2* 12.3  PLT 140* 159  CREATININE 2.76* 2.82*   Estimated Creatinine Clearance: 16.8 mL/min (by C-G formula based on Cr of 2.82). No results for input(s): VANCOTROUGH, VANCOPEAK, VANCORANDOM, GENTTROUGH, GENTPEAK, GENTRANDOM, TOBRATROUGH, TOBRAPEAK, TOBRARND, AMIKACINPEAK, AMIKACINTROU, AMIKACIN in the last 72 hours.   Microbiology: Recent Results (from the past 720 hour(s))  MRSA PCR Screening     Status: Abnormal   Collection Time: 01/09/2015  6:34 AM  Result Value Ref Range Status   MRSA by PCR POSITIVE (A) NEGATIVE Final    Comment:        The GeneXpert MRSA Assay (FDA approved for NASAL specimens only), is one  component of a comprehensive MRSA colonization surveillance program. It is not intended to diagnose MRSA infection nor to guide or monitor treatment for MRSA infections. RESULT CALLED TO, READ BACK BY AND VERIFIED WITH: J BEABRAUT,RN AT 2774 12/26/2014 BY K BARR     Medical History: Past Medical History  Diagnosis Date  . Diabetes mellitus   . Hypertension   . Hyperlipidemia   . GERD (gastroesophageal reflux disease)   . Arthritis   . Anxiety   . Psoriasis   . CHF (congestive heart failure)   . History of detached retina repair     Bilateral  . Complication of anesthesia     History of cardiac arrest after being in admitted to room, pain medication does not agree with patient  . PONV (postoperative nausea and vomiting)   . Myocardial infarction 1980's  . TIA (transient ischemic attack)   . History of blood transfusion     d/t surgery  . Peripheral vascular disease   . Nodule of chest wall 2010    benign mass chest  . CAD (coronary artery disease)   . Depression   . Shortness of breath     WITH EXERT  . Sleep apnea     Does not use CPAP    . History of UTI   . Stroke     MINI STROKES 7-8 YRS AGO  . Headache(784.0)   . Cancer     Left Breast  . No pertinent past medical history  Assessment: 81 yo F presents on 1/5 with respiratory distress and cardiac arrest. Pt was recently treated for respiratory infection. Questionable aspiration PNA. Was given vancomycin and zosyn x 1 in the ED. SCr trending up to 2.8, CrCl ~62ml/min.  Goal of Therapy:  Eradication of infection  Plan:  Stop Vancomycin Continue Zosyn 2.25g IV Q8 Monitor renal function, clinical picture  Jaquawn Saffran J 12/24/2014,10:34 AM

## 2014-12-18 NOTE — H&P (Signed)
PULMONARY / CRITICAL CARE MEDICINE   Name: [name] MRN: [id number] DOB: 02/08/1926    ADMISSION DATE:  01/09/2015 CONSULTATION DATE:  01/01/2015  REFERRING MD :  [name], MD  CHIEF COMPLAINT:  Cardiac arrest  INITIAL PRESENTATION: 81 year old woman with hx of DM2, HTN, GERD, CHF, CAD, PVD, OSA presenting with respiratory distress and cardiac arrest.  STUDIES:  CTA chest 1/5 >> No PE, bilateral dependent airspace consolidation ? Asp pna. CT head 1/5 >> No acute intracranial abnormalities, mucosal thickening throughout paranasal sinuses.  SIGNIFICANT EVENTS: 1/5 >> Cardiac arrest, intubated, placed on epi gtt., 45 min pea  HISTORY OF PRESENT ILLNESS:  81 year old woman with hx of DM2, HTN, GERD, CHF, CAD, PVD, OSA presenting with respiratory distress and cardiac arrest. Per EMR, pt's family reports she was recently treated for respiratory infection with abx. Had acute respiratory distress last night. En route to [facility] ED and in ED, she was in asystole and received multiple rounds of epinephrine and bicarb. Placed on epi gtt.   REVIEW OF SYSTEMS:  Unable to obtain 2/2 mental status  SUBJECTIVE: NAD. Intubated.  VITAL SIGNS: Temp:  [99.1 F (37.3 C)-100 F (37.8 C)] 99.3 F (37.4 C) (01/05 0531) Pulse Rate:  [64-114] 98 (01/05 0745) Resp:  [13-32] 24 (01/05 0745) BP: (99-201)/(40-106) 157/61 mmHg (01/05 0745) SpO2:  [85 %-100 %] 100 % (01/05 0745) FiO2 (%):  [70 %-100 %] 70 % (01/05 0611) Weight:  [104.327 kg (230 lb)] 104.327 kg (230 lb) (01/05 0123) HEMODYNAMICS:   VENTILATOR SETTINGS: Vent Mode:  [-] PRVC FiO2 (%):  [70 %-100 %] 70 % Set Rate:  [16 bmp-18 bmp] 18 bmp Vt Set:  [500 mL-550 mL] 500 mL PEEP:  [5 cmH20] 5 cmH20 Plateau Pressure:  [26 cmH20-39 cmH20] 26 cmH20 INTAKE / OUTPUT:  Intake/Output Summary (Last 24 hours) at 01/09/2015 0802 Last data filed at 12/22/2014 0600  Gross per 24 hour  Intake   48.8 ml  Output    450 ml  Net -401.2 ml    PHYSICAL  EXAMINATION: General:  NAD Neuro:  Cough present, no gag, pup cat rt left nonfxnal blind, corneals rt wnl, dolls wnl, no movement any ext pain HEENT:  [place]/AT, ETT in place Cardiovascular:  RRR Lungs:  Mechanical BS bilaterally Abdomen:  Soft, moderately distended Musculoskeletal:  Bilateral AKA Skin:  Warm and well perfused  LABS:  CBC  Recent Labs Lab 01/12/2015 0025 12/20/2014 0610  WBC 12.6* 15.5*  HGB 11.2* 12.3  HCT 33.8* 36.8  PLT 140* 159   Coag's  Recent Labs Lab 01/06/2015 0610  INR 1.23   BMET  Recent Labs Lab 01/07/2015 0025 12/17/2014 0610  NA 137 137  K 4.3 4.9  CL 104 101  CO2 22 22  BUN 28* 29*  CREATININE 2.76* 2.82*  GLUCOSE 286* 296*   Electrolytes  Recent Labs Lab 12/23/2014 0025 01/04/2015 0610  CALCIUM 9.1 9.2   Sepsis Markers  Recent Labs Lab 12/16/2014 0025 01/07/2015 0121 12/15/2014 0610  LATICACIDVEN 7.4*  --  4.6*  PROCALCITON  --  0.13  --    ABG  Recent Labs Lab 01/09/2015 0605  PHART 7.416  PCO2ART 30.4*  PO2ART 218.0*   Liver Enzymes No results for input(s): AST, ALT, ALKPHOS, BILITOT, ALBUMIN in the last 168 hours. Cardiac Enzymes  Recent Labs Lab 12/24/2014 0025 12/18/14 0610  TROPONINI 0.13* 0.30*   Glucose  Recent Labs Lab 12/18/14 0424 12/18/14 0526  GLUCAP 235* 249*  Imaging No results found.   ASSESSMENT / PLAN:  PULMONARY ETT 1/4 >> A: Acute respiratory failure - no PE on CTA ?aspiration PNA OSA P:   Full vent support, Wean as tolerated ABg reviewed, keep same MV If for apnea - to 100% Assess vent trigger by pt No lasix as of now  CARDIOVASCULAR A: Post cardiac arrest troponin elevation with new RBBB likely just 2/2 to post cardiac arrest Lactic acidosis improving  CHF (echo 2010 LV EF 55-60%, 04/4007 grade 2 diastolic dysfunction) CAD PVD HTN  P:  Switched epi gtt to Levo gtt. Wean levo as tolerated. MAP goal >60 Consider central line placement - hold off until d/w family, consider  comfort care Repeat LA noted, no repeat Cycle troponins but I don't think this is MI, likely 2/2 to CPR.  Repeat EKG as right sided to r/o rt heart MI Consider ECHO - pending family discussions Cortisol if remains aggressive care  RENAL A:  Acute on CKD stage 4 baseline crt 1.78, could be 2/2 to shock s/p cardiac arrest but doesn't have pre-renal bun/crt ratio P:   Repeat BMP NS @ 75 cc/hr Consider renal u/s, Urine sodium, urine creatinine. - if continued support  GASTROINTESTINAL A:  GERD P:   NPO, start TF if continued support Protonix for SUP  HEMATOLOGIC A:  VTE ppx P:  SQ Lovenox - dc arf, add sub q hep SCDs unable as aka Follow CBC  INFECTIOUS A:  ?aspiration PNA - procalcitonin 0.13 P:   BCx2 1/5 >> UC 1/5 >> Sputum Cx 1/5 >>  Abx:  Vanc, start date 1/5 >>1/5 Zosyn, start date 1/5 >>  Pct neg, repeat in am   ENDOCRINE A:  DM2 P:   SQ insulin per protocol  NEUROLOGIC A:  Acute encephalopathy, seveer anoxia P:   RASS goal: 0 to -1 Likely to escalate to brain death eeg for subclinical seziures  FAMILY  - Updates: daughter [name] at bedside.   - Inter-disciplinary family meet or Palliative Care meeting due by:  12/25/2014    TODAY'S SUMMARY: s/p cardiac arrest, intubated and placed on epi gtt. Switched to levo gtt.   [name], M.D PGY-1 Internal Medicine Pager [phone]   Pulmonary and [place] Pager: [phone]  01/12/2015, 8:02 AM   STAFF NOTE: I, Merrie Roof, MD FACP have personally reviewed patient's available data, including medical history, events of note, physical examination and test results as part of my evaluation. I have discussed with resident/NP and other care providers such as pharmacist, RN and RRT. In addition, I personally evaluated patient and elicited key findings of: unresponsive to pain, likely poor outcome, not brain dead yet, dnr established, keep same mV, levo titrattion,  eeg The patient is critically ill with multiple organ systems failure and requires high complexity decision making for assessment and support, frequent evaluation and titration of therapies, application of advanced monitoring technologies and extensive interpretation of multiple databases.   Critical Care Time devoted to patient care services described in this note is 45 Minutes. This time reflects time of care of this signee: Merrie Roof, MD FACP. This critical care time does not reflect procedure time, or teaching time or supervisory time of PA/NP/Med student/Med Resident etc but could involve care discussion time. Rest per NP/medical resident whose note is outlined above and that I agree with  I have had extensive discussions with family daughters. We discussed patients current circumstances and organ failures. We also discussed patient's prior  wishes under circumstances such as this. Family has decided to NOT perform resuscitation if arrest but to continue current medical support for now.    Lavon Paganini. Titus Mould, MD, [place] Pgr: [place] [facility] 12/14/2014 10:14 AM

## 2014-12-18 NOTE — ED Notes (Signed)
CPR held. Pulses present. Family and Dr. Roxanne Mins at bedside.

## 2014-12-18 NOTE — Consult Note (Signed)
Reason for Consult: cardiac arrest Referring Physician: Dr. Verda Cumins is an 81 y.o. female.  HPI: 80 yo femae with hx of CAD s/p MI, CHF (ef nl), TIA, Dm2 who presents with respiratory distresss and cardiac arrest.  Pt apparently has had coughing and bronchitis and tx with abx, ? Levaquin.  Tonight at about 9:30pm pt was sob, clutching her chest, attempting to get up from chair and had respiratory distress.  CPR done by family til EMS arrived.  Pt received epi x4-5 in the field and was intubated.  Pt arrived in ED and recived multiple rounds of epi for asystole. And also bicarbonate.  Pt placed on epi drip.  Initially pt thought not to be stable enough for transfer, and but pt had been stable on epi drip for about 45 minutes and family was comfortable with transfer to [facility] for further treatment. Case was discussed with Dr. Elsworth Soho who recommended CTA chest but her creatinine is 2.76.  Pt will be transferred to [facility] for further evaluation.    Past Medical History  Diagnosis Date  . Diabetes mellitus   . Hypertension   . Hyperlipidemia   . GERD (gastroesophageal reflux disease)   . Arthritis   . Anxiety   . Psoriasis   . CHF (congestive heart failure)   . History of detached retina repair     Bilateral  . Complication of anesthesia     History of cardiac arrest after being in admitted to room, pain medication does not agree with patient  . PONV (postoperative nausea and vomiting)   . Myocardial infarction 1980's  . TIA (transient ischemic attack)   . History of blood transfusion     d/t surgery  . Peripheral vascular disease   . Nodule of chest wall 2010    benign mass chest  . CAD (coronary artery disease)   . Depression   . Shortness of breath     WITH EXERT  . Sleep apnea     Does not use CPAP    . History of UTI   . Stroke     MINI STROKES 7-8 YRS AGO  . Headache(784.0)   . Cancer     Left Breast  . No pertinent past medical history     Past Surgical History   Procedure Laterality Date  . Femoral-peroneal bpg   2006    Right leg  . 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 Abdominal hysterectomy    . Appendectomy    . Eye surgery      Bilateral cataract  . Tonsillectomy and adenoidectomy    . Umbilical hernia repair    . Finger amputation  1997    Left Third digit- distal end  . Above knee leg amputation  06/29/11    Right AKA by Dr. Donnetta Hutching  . Breast surgery  1995    Left Breast mastectomy  . No past surgeries      ESOPH  PROCEDURE  . Anterior chamber washout  08/12/2012    Procedure: ANTERIOR CHAMBER WASHOUT;  Surgeon: Hurman Horn, MD;  Location: [place];  Service: Ophthalmology;  Laterality: Right;  Vitreous Washout    History reviewed. No pertinent family history.  Social History:  reports that she has never smoked. She does not have any smokeless tobacco history on file. She reports that she does not drink alcohol or use illicit drugs.  Allergies:  Allergies  Allergen Reactions  .  Tramadol Anaphylaxis    Caused cardiac arrest after leg amputation  . Aspirin   . Atorvastatin   . Codeine     Hallucinations/Passes Out   . Demerol [Meperidine]     Hallucinations/Passes Out   . Fentanyl     Hallucinations/Passes Out   . Morphine And Related     Hallucinations/Passes Out   . Nitrofurantoin     REACTION: Trouble breathing, and swallowing.  Marland Kitchen Penicillin G Nausea Only  . Penicillins   . Rosuvastatin   . Sulfamethoxazole Nausea Only  . Sulfonamide Derivatives   . Tobramycin-Dexamethasone Nausea Only  . Betaxolol Anxiety  . Diazepam Anxiety    hallucinations  . Other Anxiety    Pain medicine in general causes hallucinations  . Timolol Anxiety    Medications: I have reviewed the patient's current medications.  Results for orders placed or performed during the hospital encounter of 12/27/2014 (from the past 48 hour(s))  CBC with Differential     Status: Abnormal   Collection Time: 01/07/2015 12:25 AM  Result Value  Ref Range   WBC 12.6 (H) 4.0 - 10.5 K/uL   RBC 3.54 (L) 3.87 - 5.11 MIL/uL   Hemoglobin 11.2 (L) 12.0 - 15.0 g/dL   HCT 33.8 (L) 36.0 - 46.0 %   MCV 95.5 78.0 - 100.0 fL   MCH 31.6 26.0 - 34.0 pg   MCHC 33.1 30.0 - 36.0 g/dL   RDW 13.5 11.5 - 15.5 %   Platelets 140 (L) 150 - 400 K/uL   Neutrophils Relative % 70 43 - 77 %   Lymphocytes Relative 25 12 - 46 %   Monocytes Relative 0 (L) 3 - 12 %   Eosinophils Relative 5 0 - 5 %   Basophils Relative 0 0 - 1 %   Band Neutrophils 0 0 - 10 %   Metamyelocytes Relative 0 %   Myelocytes 0 %   Promyelocytes Absolute 0 %   Blasts 0 %   nRBC 0 0 /100 WBC   Neutro Abs 8.8 (H) 1.7 - 7.7 K/uL   Lymphs Abs 3.2 0.7 - 4.0 K/uL   Monocytes Absolute 0.0 (L) 0.1 - 1.0 K/uL   Eosinophils Absolute 0.6 0.0 - 0.7 K/uL   Basophils Absolute 0.0 0.0 - 0.1 K/uL  Basic metabolic panel     Status: Abnormal   Collection Time: 01/13/2015 12:25 AM  Result Value Ref Range   Sodium 137 135 - 145 mmol/L    Comment: Please note change in reference range.   Potassium 4.3 3.5 - 5.1 mmol/L    Comment: Please note change in reference range.   Chloride 104 96 - 112 mEq/L   CO2 22 19 - 32 mmol/L   Glucose, Bld 286 (H) 70 - 99 mg/dL   BUN 28 (H) 6 - 23 mg/dL   Creatinine, Ser 2.76 (H) 0.50 - 1.10 mg/dL   Calcium 9.1 8.4 - 10.5 mg/dL   GFR calc non Af Amer 15 (L) >90 mL/min   GFR calc Af Amer 17 (L) >90 mL/min    Comment: (NOTE) The eGFR has been calculated using the CKD EPI equation. This calculation has not been validated in all clinical situations. eGFR's persistently <90 mL/min signify possible Chronic Kidney Disease.    Anion gap 11 5 - 15  Troponin I     Status: Abnormal   Collection Time: 12/28/2014 12:25 AM  Result Value Ref Range   Troponin I 0.13 (H) <0.031 ng/mL  Comment:        PERSISTENTLY INCREASED TROPONIN VALUES IN THE RANGE OF 0.04-0.49 ng/mL CAN BE SEEN IN:       -UNSTABLE ANGINA       -CONGESTIVE HEART FAILURE       -MYOCARDITIS        -CHEST TRAUMA       -ARRYHTHMIAS       -LATE PRESENTING MYOCARDIAL INFARCTION       -COPD   CLINICAL FOLLOW-UP RECOMMENDED. Please note change in reference range.   Lactic acid, plasma     Status: Abnormal   Collection Time: 12/26/2014 12:25 AM  Result Value Ref Range   Lactic Acid, Venous 7.4 (H) 0.5 - 2.2 mmol/L    Dg Chest Portable 1 View  12/17/2014   CLINICAL DATA:  Cardiac and rest prior to arrival it ED tonight. Endotracheal tube placement.  EXAM: PORTABLE CHEST - 1 VIEW  COMPARISON:  09/12/2014  FINDINGS: Endotracheal tube placed with tip measuring about 7 mm above the carina. Shallow inspiration. Linear atelectasis in the right mid and lower lung. Atelectasis or consolidation in the left upper lung. No blunting of costophrenic angles. No pneumothorax. Cardiac enlargement. Pulmonary vascularity is normal. Degenerative changes in the spine and shoulders.  IMPRESSION: Endotracheal tube tip measures only about 7 mm above the carinal. Shallow inspiration with atelectasis in the right lung base and consolidation or atelectasis in the left upper lung.   Electronically Signed   By: Lucienne Capers M.D.   On: 12/20/2014 00:41    Review of Systems  Unable to perform ROS: intubated   Blood pressure 130/51, pulse 78, temperature 99.9 F (37.7 C), temperature source Core (Comment), resp. rate 29, weight 104.327 kg (230 lb), SpO2 100 %. Physical Exam  Constitutional: She appears well-developed and well-nourished.  HENT:  Head: Normocephalic and atraumatic.  Eyes: Conjunctivae are normal. Pupils are equal, round, and reactive to light. Right eye exhibits no discharge. Left eye exhibits no discharge. No scleral icterus.  Neck: Normal range of motion. Neck supple. No JVD present. No tracheal deviation present. No thyromegaly present.  Cardiovascular: Normal rate and regular rhythm.  Exam reveals no gallop and no friction rub.   No murmur heard. Respiratory: No stridor. No respiratory distress.  She has no wheezes. She has no rales.  GI: Soft. Bowel sounds are normal. She exhibits no distension. There is no tenderness. There is no rebound and no guarding.  Morbidly obsese  Musculoskeletal: Normal range of motion. She exhibits no edema or tenderness.  Lymphadenopathy:    She has no cervical adenopathy.  Neurological: She has normal reflexes. She displays normal reflexes. No cranial nerve deficit. She exhibits normal muscle tone.  Skin: Skin is warm and dry. No rash noted. No erythema. No pallor.    Assessment/Plan: Respiratory and cardiac arrest Telemetry Pt will need to have cardiac markers cycled as well as cardiac echo Cont epi drip Transfer to [facility] ICU for continued care  ? Pneumonia LUL Pt will need iv abx,  Defer to ICU procalcitonin pending  ARF Will need urine sodium , urine creatinine and urine eosinophils, and renal ultrasound  RBBB, cardiac arrest Pt will need VQ scan to r/o PE  Dm2 Pt will need fsbs and iss.   Code status: FULL CODE Dispo: [facility] ICU,  Case d/w Dr. Elsworth Soho and [place], [place] 1/0/2725, 2:15 AM

## 2014-12-19 LAB — GLUCOSE, CAPILLARY
GLUCOSE-CAPILLARY: 232 mg/dL — AB (ref 70–99)
Glucose-Capillary: 223 mg/dL — ABNORMAL HIGH (ref 70–99)
Glucose-Capillary: 235 mg/dL — ABNORMAL HIGH (ref 70–99)
Glucose-Capillary: 249 mg/dL — ABNORMAL HIGH (ref 70–99)

## 2014-12-19 LAB — URINE CULTURE
COLONY COUNT: NO GROWTH
Culture: NO GROWTH

## 2014-12-19 MED ORDER — MORPHINE SULFATE 25 MG/ML IV SOLN
10.0000 mg/h | INTRAVENOUS | Status: DC
  Administered 2014-12-19: 4 mg/h via INTRAVENOUS
  Filled 2014-12-19: qty 10

## 2014-12-19 MED ORDER — MIDAZOLAM BOLUS VIA INFUSION
5.0000 mg | INTRAVENOUS | Status: DC | PRN
  Filled 2014-12-19: qty 20

## 2014-12-19 MED ORDER — MORPHINE BOLUS VIA INFUSION
5.0000 mg | INTRAVENOUS | Status: DC | PRN
  Filled 2014-12-19: qty 20

## 2014-12-19 MED ORDER — MIDAZOLAM HCL 5 MG/ML IJ SOLN
10.0000 mg/h | INTRAMUSCULAR | Status: DC
  Administered 2014-12-19: 2 mg/h via INTRAVENOUS
  Filled 2014-12-19: qty 10

## 2014-12-19 MED FILL — Medication: Qty: 1 | Status: AC

## 2014-12-20 LAB — CULTURE, RESPIRATORY W GRAM STAIN

## 2014-12-20 LAB — CULTURE, RESPIRATORY

## 2014-12-24 LAB — CULTURE, BLOOD (ROUTINE X 2)
CULTURE: NO GROWTH
CULTURE: NO GROWTH

## 2014-12-26 NOTE — Discharge Summary (Addendum)
NAMEBRIDGIT, EYNON NO.:  [id number]  MEDICAL RECORD NO.:  [id number]  LOCATION:  2M06C                        FACILITY:  [facility]  PHYSICIAN:  Raylene Miyamoto, MD DATE OF BIRTH:  1926-04-02  DATE OF ADMISSION:  12/17/2014 DATE OF DISCHARGE:  Dec 21, 2014                              DISCHARGE SUMMARY   This is an unfortunate 81 year old woman with history of type 2 diabetes, hypertension, GERD, CHF, CAD, PVD, OSA, who presented with respiratory distress followed by cardiac arrest.  CT scan of the chest showed no PEs, bilateral dependent airspace consolidation consistent with likely aspiration pneumonia.  CT scan of the head on December 18, 2014, no acute process, mucosal thickening throughout the paranasal sinuses.  The patient has had suffered from a cardiac arrest, intubated at home, placed on epi drip 45 minutes to rusk.  The patient was made a DNR by the family.  The patient is known to have bilateral AKAs, although can transfer from wheelchair to bed.  Upon physical examination, the patient had lost the majority of her reflexes, neurological from severe anoxia.  An EEG was performed, which did not show any subclinical seizures that was done on December 18, 2014.  The patient on the maintenance support on the vent with continued observation with a DNR status and on Dec 21, 2014, was noted to actually fit criteria for brain death.  On physical examination, losing her cough and gag and any kind of respiratory drive.  Family meeting was undertaken given her bilateral AKA status and prior functional quality of life to be limited and now severe anoxic brain injury and family opted for comfort care based on her prior wishes.  The patient was not a donor.  Comfort care was provided and the patient expired within minutes.  FINAL DIAGNOSES UPON DEATH: 1. Severe anoxic brain injury leading to clinical diagnosis of brain     death. 2. Status post cardiac arrest, 45  minutes of pulseless electrical     activity likely secondary to respiratory failure. 3. Right bundle-branch block. 4. Lactic acidosis, status post cardiac arrest. 5. Diastolic heart failure. 6. Bilateral above knee amputations. 7. Diabetes. 8. Rule out aspiration pneumonia. 9 cardiogenic shock   Raylene Miyamoto, MD     DJF/MEDQ  D:  12/26/2014  T:  12/26/2014  Job:  [id number]

## 2015-01-14 NOTE — Clinical Documentation Improvement (Signed)
Supporting Information: Labs: Lactic acid: 01/05 @ 0025:  7.4. 01/05 @ 0610:  4.6.    Possible Clinical Conditions: . Bacteremia (positive blood cultures only) . Sepsis with UTI, versus UTI only . Sepsis-specify causative organism if known . Sepsis due to: --Device --Implant --Graft --Infusion --Abortion . Severe sepsis-sepsis with organ dysfunction --Specify organ dysfunction Respiratory failure Encephalopathy Acute kidney failure Other (specify) . SIRS (Systemic Inflammatory Response Syndrome --With or without organ dysfunction . Document septic shock if present . Document any associated diagnoses/conditions    Thank Sherian Maroon Documentation Specialist [phone] [email or web address]

## 2015-01-14 NOTE — Progress Notes (Signed)
Patient terminally extubated. No complications noted.

## 2015-01-14 NOTE — Progress Notes (Signed)
PULMONARY / CRITICAL CARE MEDICINE   Name: [name] MRN: [id number] DOB: 09/18/1926    ADMISSION DATE:  12/25/2014 CONSULTATION DATE:  12/31/2014  REFERRING MD :  [name], MD  CHIEF COMPLAINT:  Cardiac arrest  INITIAL PRESENTATION: 81 year old woman with hx of DM2, HTN, GERD, CHF, CAD, PVD, OSA presenting with respiratory distress and cardiac arrest.  STUDIES:  CTA chest 1/5 >> No PE, bilateral dependent airspace consolidation ? Asp pna. CT head 1/5 >> No acute intracranial abnormalities, mucosal thickening throughout paranasal sinuses.  SIGNIFICANT EVENTS: 1/5 >> Cardiac arrest, intubated, placed on epi gtt., 45 min pea. Made DNR with family.  HISTORY OF PRESENT ILLNESS:  81 year old woman with hx of DM2, HTN, GERD, CHF, CAD, PVD, OSA presenting with respiratory distress and cardiac arrest. Per EMR, pt's family reports she was recently treated for respiratory infection with abx. Had acute respiratory distress last night. En route to [facility] ED and in ED, she was in asystole and received multiple rounds of epinephrine and bicarb. Placed on epi gtt.   REVIEW OF SYSTEMS:  Unable to obtain 2/2 mental status  SUBJECTIVE: NAD. Intubated, worsening neurostatus  VITAL SIGNS: Temp:  [97.4 F (36.3 C)-100.1 F (37.8 C)] 99.7 F (37.6 C) (01/06 0425) Pulse Rate:  [67-120] 102 (01/06 0600) Resp:  [6-33] 18 (01/06 0600) BP: (63-166)/(20-74) 123/43 mmHg (01/06 0600) SpO2:  [63 %-100 %] 100 % (01/06 0600) FiO2 (%):  [40 %-50 %] 40 % (01/06 0400) Weight:  [105.7 kg (233 lb 0.4 oz)] 105.7 kg (233 lb 0.4 oz) (01/06 0433) HEMODYNAMICS:   VENTILATOR SETTINGS: Vent Mode:  [-] PRVC FiO2 (%):  [40 %-50 %] 40 % Set Rate:  [18 bmp] 18 bmp Vt Set:  [500 mL] 500 mL PEEP:  [5 cmH20] 5 cmH20 Plateau Pressure:  [23 cmH20-28 cmH20] 24 cmH20 INTAKE / OUTPUT:  Intake/Output Summary (Last 24 hours) at 12/25/2014 0647 Last data filed at 2014/12/25 0600  Gross per 24 hour  Intake 2155.75 ml  Output    1235 ml  Net 920.75 ml    PHYSICAL EXAMINATION: General:  NAD Neuro:  Cough absent, no gag, no corneal reflex, no pupillary reflex, no dolls eye, no movement of any ext pain with pain, c/w with Brain death. HEENT:  [place]/AT, ETT in place Cardiovascular:  RRR Lungs:  Mechanical BS bilaterally Abdomen:  Soft, moderately distended Musculoskeletal:  Bilateral AKA Skin:  Warm and well perfused  LABS:  CBC  Recent Labs Lab 12/30/2014 0025 01/01/2015 0610  WBC 12.6* 15.5*  HGB 11.2* 12.3  HCT 33.8* 36.8  PLT 140* 159   Coag's  Recent Labs Lab 12/30/2014 0610  INR 1.23   BMET  Recent Labs Lab 12/30/2014 0025 01/09/2015 0610  NA 137 137  K 4.3 4.9  CL 104 101  CO2 22 22  BUN 28* 29*  CREATININE 2.76* 2.82*  GLUCOSE 286* 296*   Electrolytes  Recent Labs Lab 12/15/2014 0025 12/27/2014 0610  CALCIUM 9.1 9.2   Sepsis Markers  Recent Labs Lab 12/17/2014 0025 01/10/2015 0121 12/21/2014 0610  LATICACIDVEN 7.4*  --  4.6*  PROCALCITON  --  0.13  --    ABG  Recent Labs Lab 01/02/2015 0605  PHART 7.416  PCO2ART 30.4*  PO2ART 218.0*   Liver Enzymes No results for input(s): AST, ALT, ALKPHOS, BILITOT, ALBUMIN in the last 168 hours. Cardiac Enzymes  Recent Labs Lab 12/17/2014 0025 12/18/14 0610 12/18/14 1126  TROPONINI 0.13* 0.30* 0.53*   Glucose  Recent  Labs Lab 01/13/2015 1213 12/31/2014 1540 12/15/2014 1701 01/02/2015 1932 December 31, 2014 0006 12/31/14 0429  GLUCAP 264* 235* 196* 202* 249* 235*    Imaging Ct Head Wo Contrast  12/21/2014   ADDENDUM REPORT: 12/29/2014 03:07  ADDENDUM: Sulci appear somewhat less distinct than on prior study with slight indistinct appearance of gray-white junction. This may be due to differences in technique due to use a different scanners. However, in the setting of cardiac arrest, this could represent early ischemic change.   Electronically Signed   By: Lucienne Capers M.D.   On: 01/03/2015 03:07   01/07/2015   CLINICAL DATA:  Unresponsive and  on a ventilator. Altered level of consciousness.  EXAM: CT HEAD WITHOUT CONTRAST  TECHNIQUE: Contiguous axial images were obtained from the base of the skull through the vertex without intravenous contrast.  COMPARISON:  12/21/2009  FINDINGS: Ventricles and sulci appear symmetrical. Dystrophic are postinflammatory calcifications demonstrated along the tentorium anterior. No change since prior study. No mass effect or midline shift. No abnormal extra-axial fluid collections. Gray-white matter junctions are distinct. Basal cisterns are not effaced. No evidence of acute intracranial hemorrhage. No depressed skull fractures. Mucosal thickening throughout the paranasal sinuses. Opacification of some of the right mastoid air cells. Vascular calcifications. Postoperative changes in the right orbit.  IMPRESSION: No acute intracranial abnormalities. Mucosal thickening throughout the paranasal sinuses. Postoperative changes in the right orbit.  Electronically Signed: By: Lucienne Capers M.D. On: 12/16/2014 02:58   Ct Angio Chest Pe W/cm &/or Wo Cm  12/20/2014   CLINICAL DATA:  Ventilated patient. Unresponsive. Status post cardiac arrest. Assess for pulmonary embolus.  EXAM: CT ANGIOGRAPHY CHEST WITH CONTRAST  TECHNIQUE: Multidetector CT imaging of the chest was performed using the standard protocol during bolus administration of intravenous contrast. Multiplanar CT image reconstructions and MIPs were obtained to evaluate the vascular anatomy.  CONTRAST:  155mL OMNIPAQUE IOHEXOL 350 MG/ML SOLN  COMPARISON:  CT of the chest performed 03/08/2014, and chest radiograph performed earlier today at 12:17 a.m.  FINDINGS: There is no evidence of significant pulmonary embolus.  Bilateral dependent airspace consolidation raises concern for pneumonia. Aspiration is a concern. There is no evidence of pleural effusion or pneumothorax. No masses are identified; no abnormal focal contrast enhancement is seen.  A 4.9 x 3.4 cm cystic  structure is again noted in the right paratracheal region. This is stable from the prior study and may reflect a benign bronchogenic cyst. The endotracheal tube is seen ending 1-2 cm above the carina. There is mild distal tracheomalacia. There is question of hilar lymphadenopathy, difficult to fully assess due to airspace opacification. No pericardial effusion is seen. The great vessels are grossly unremarkable, aside from scattered calcification. No axillary lymphadenopathy is seen. A 3.3 cm peripherally calcified mass is partially noted at the left thyroid lobe. This has increased in size from the prior study.  The visualized portions of the liver and spleen are unremarkable.  No acute osseous abnormalities are seen. Degenerative change is noted at the glenohumeral joints bilaterally.  Review of the MIP images confirms the above findings.  IMPRESSION: 1. No evidence of significant pulmonary embolus. 2. Bilateral dependent airspace opacification raises concern for pneumonia. Given the patient's presentation, this is concerning for aspiration. 3. Question of hilar lymphadenopathy, difficult to fully assess due to airspace opacification. 4. Endotracheal tube seen ending 1-2 cm above the carina. This could be retracted 1-2 cm, as deemed clinically appropriate. 5. 4.9 cm cystic structure at the right paratracheal region is  stable and may reflect a benign bronchogenic cyst. 6. 3.3 cm peripherally calcified mass at the left thyroid lobe has increased in size. Further evaluation could be performed with thyroid ultrasound, as deemed clinically appropriate. If patient is clinically hyperthyroid, consider nuclear medicine thyroid uptake and scan.   Electronically Signed   By: Garald Balding M.D.   On: 12/29/2014 03:10   Dg Chest Port 1 View  01/12/2015   CLINICAL DATA:  Assess endotracheal tube.  EXAM: PORTABLE CHEST - 1 VIEW  COMPARISON:  12/23/2014  FINDINGS: Endotracheal tube measures 2 cm above the carinal. Enteric  tube tip is below the field of view and below the left hemidiaphragm. Shallow inspiration. Improving atelectasis in the lung bases. No focal consolidation. No blunting of costophrenic angles. No pneumothorax.  IMPRESSION: Endotracheal tube tip measures 2 cm above the carinal. Improving atelectasis in the lung bases.   Electronically Signed   By: Lucienne Capers M.D.   On: 01/10/2015 05:38   Dg Chest Portable 1 View  01/02/2015   CLINICAL DATA:  Cardiac and rest prior to arrival it ED tonight. Endotracheal tube placement.  EXAM: PORTABLE CHEST - 1 VIEW  COMPARISON:  09/12/2014  FINDINGS: Endotracheal tube placed with tip measuring about 7 mm above the carina. Shallow inspiration. Linear atelectasis in the right mid and lower lung. Atelectasis or consolidation in the left upper lung. No blunting of costophrenic angles. No pneumothorax. Cardiac enlargement. Pulmonary vascularity is normal. Degenerative changes in the spine and shoulders.  IMPRESSION: Endotracheal tube tip measures only about 7 mm above the carinal. Shallow inspiration with atelectasis in the right lung base and consolidation or atelectasis in the left upper lung.   Electronically Signed   By: Lucienne Capers M.D.   On: 12/18/2014 00:41     ASSESSMENT / PLAN:  PULMONARY ETT 1/4 >> A: Acute respiratory failure - no PE on CTA ?aspiration PNA OSA P:   Full vent support until family meeting. If for apnea - to 100% NOT breathing over vent rate-neuro  CARDIOVASCULAR A: Post cardiac arrest troponin elevation with new RBBB likely just 2/2 to post cardiac arrest Hypotension requiring pressor Lactic acidosis CHF (echo 2010 LV EF 55-60%, 12/9507 grade 2 diastolic dysfunction) CAD PVD HTN  P:  Keep on levo 1mcg/min for now. Don't go up on it anymore. Will have family meeting and likely comfort care today. incvreased pressors will NOT change outcome MAP 60 goal Cortisol if remains aggressive care DNR  RENAL A:  Acute on CKD stage  4 baseline crt 1.78, could be 2/2 to shock s/p cardiac arrest but doesn't have pre-renal bun/crt ratio P:   NS @ 75 cc/hr- to kvo Avoid free water with presumed brain edema  GASTROINTESTINAL A:  GERD P:   NPO, start TF if continued support Protonix for SUP  HEMATOLOGIC A:  VTE ppx P:  sub q hep SCDs unable as aka  INFECTIOUS A:  ?aspiration PNA - procalcitonin 0.13 P:   BCx2 1/5 >> Sputum Cx 1/5 >>  Abx:  Vanc, start date 1/5 >>1/5 Zosyn, start date 1/5 >>  Pct neg initially. Cont zosyn for now.  ENDOCRINE A:  DM2 P:   SQ insulin per protocol Cortisol if continued support  NEUROLOGIC A:  Acute encephalopathy, anoxic brain injury, EEg c/w anoxia 1/6 brain dead on exam P:   RASS goal: 0 to -1 Likely to escalate to brain death eeg did not show subclinical seziures DNR Will meet today and consider comfort care  FAMILY  - Updates: daughter [name] at bedside.   - Inter-disciplinary family meet or Palliative Care meeting due by:  12/25/2014   TODAY'S SUMMARY: s/p cardiac arrest, high mortality due to b/l AKA and >45 mins before ROSC. Comfort care for now. Family meeting today.   [name], M.D PGY-1 Internal Medicine Pager [phone]   Pulmonary and [place] Pager: [phone]  January 10, 2015, 6:47 AM  STAFF NOTE: I, Merrie Roof, MD FACP have personally reviewed patient's available data, including medical history, events of note, physical examination and test results as part of my evaluation. I have discussed with resident/NP and other care providers such as pharmacist, RN and RRT. In addition, I personally evaluated patient and elicited key findings [id number] meeting for comfort care, neuro exam worsening exam c/w brain death, no new labs noted, may need apnea testing, pending fam wishesThe patient is critically ill with multiple organ systems failure and requires high complexity decision making for assessment and support,  frequent evaluation and titration of therapies, application of advanced monitoring technologies and extensive interpretation of multiple databases.   Critical Care Time devoted to patient care services described in this note is30 Minutes. This time reflects time of care of this signee: Merrie Roof, MD FACP. This critical care time does not reflect procedure time, or teaching time or supervisory time of PA/NP/Med student/Med Resident etc but could involve care discussion time. Rest per NP/medical resident whose note is outlined above and that I agree with   Lavon Paganini. Titus Mould, MD, [place] Pgr: [facility] 2015-01-10 10:40 AM

## 2015-01-14 NOTE — Progress Notes (Signed)
Patient ID: ZI SEK, female   DOB: [birth year], 81 y.o.   MRN: [id number] Extensive discussion with family . They do not want apnea. We discussed the poor prognosis and likely poor quality of life. Family has decided to offer full comfort care. They are aware that the patient may be transferred to palliative care floor for continued comfort care needs. They have been fully updated on the process and expectations.  Await one friend to visit  [name]. Titus Mould, MD, [place] Pgr: [facility] [place] [facility]

## 2015-01-14 NOTE — Progress Notes (Signed)
Chaplain responded to consult.  Met with family in consultation room, made introduction and offered support.  Family expressed appreciation for level of care pt and family have received from staff on [facility].  Family member shared they felt "a lot of people around here believe in God and that is good."  Faith appears to be important to family.  Chaplain provided emotional and spiritual support as well as the ministry of hospitality.  Chaplain will follow up as needed.    2014/12/29 1300  Clinical Encounter Type  Visited With Family;Patient not available  Visit Type Initial;Spiritual support;Social support;Critical Care;Patient actively dying  Referral From Physician  Spiritual Encounters  Spiritual Needs Emotional;Grief support  Stress Factors  Family Stress Factors None identified   Geralyn Flash 2014/12/29 1:37 PM

## 2015-01-14 NOTE — Progress Notes (Signed)
Wasted 240 mg of morphine and 46 cc/mg of versed. Witnessed by [employer].

## 2015-01-14 NOTE — Progress Notes (Signed)
Nutrition Brief Note  Chart reviewed. Pt transitioning to comfort care.  No nutrition interventions warranted at this time.  Please consult as needed.   Molli Barrows, RD, LDN, [place] Pager [phone] After Hours Pager [phone]

## 2015-01-14 NOTE — Progress Notes (Signed)
Patient extubated at 1715.no respirations noted after extubation. Morphine and versed started prior to extubation. Family was at the bedside. Patient expired at 1727. Donor services made aware of event. Family requested no autopsy. Dr Titus Mould made aware. Patient has no belongings in the room.

## 2015-01-14 DEATH — deceased
